# Patient Record
Sex: Female | Born: 1935 | Race: White | Hispanic: No | Smoking: Former smoker
Health system: Southern US, Community
[De-identification: ages and names within clinical notes are randomized; demographics above are authoritative.]

## PROBLEM LIST (undated history)

## (undated) DIAGNOSIS — I10 Essential (primary) hypertension: Secondary | ICD-10-CM

## (undated) DIAGNOSIS — Z923 Personal history of irradiation: Secondary | ICD-10-CM

## (undated) DIAGNOSIS — J449 Chronic obstructive pulmonary disease, unspecified: Secondary | ICD-10-CM

## (undated) DIAGNOSIS — K219 Gastro-esophageal reflux disease without esophagitis: Secondary | ICD-10-CM

## (undated) DIAGNOSIS — Z9221 Personal history of antineoplastic chemotherapy: Secondary | ICD-10-CM

## (undated) DIAGNOSIS — D751 Secondary polycythemia: Secondary | ICD-10-CM

## (undated) DIAGNOSIS — R42 Dizziness and giddiness: Secondary | ICD-10-CM

## (undated) DIAGNOSIS — L409 Psoriasis, unspecified: Secondary | ICD-10-CM

## (undated) DIAGNOSIS — E785 Hyperlipidemia, unspecified: Secondary | ICD-10-CM

## (undated) DIAGNOSIS — N19 Unspecified kidney failure: Secondary | ICD-10-CM

## (undated) DIAGNOSIS — C3412 Malignant neoplasm of upper lobe, left bronchus or lung: Secondary | ICD-10-CM

## (undated) DIAGNOSIS — C3490 Malignant neoplasm of unspecified part of unspecified bronchus or lung: Secondary | ICD-10-CM

## (undated) HISTORY — DX: Unspecified kidney failure: N19

## (undated) HISTORY — DX: Hyperlipidemia, unspecified: E78.5

## (undated) HISTORY — PX: COLON RESECTION: SHX5231

## (undated) HISTORY — DX: Chronic obstructive pulmonary disease, unspecified: J44.9

## (undated) HISTORY — DX: Secondary polycythemia: D75.1

## (undated) HISTORY — PX: HERNIA REPAIR: SHX51

## (undated) HISTORY — DX: Personal history of antineoplastic chemotherapy: Z92.21

## (undated) HISTORY — PX: TOTAL ABDOMINAL HYSTERECTOMY: SHX209

## (undated) HISTORY — DX: Gastro-esophageal reflux disease without esophagitis: K21.9

## (undated) HISTORY — DX: Dizziness and giddiness: R42

## (undated) HISTORY — DX: Malignant neoplasm of unspecified part of unspecified bronchus or lung: C34.90

## (undated) HISTORY — PX: CATARACT EXTRACTION: SUR2

## (undated) HISTORY — DX: Personal history of irradiation: Z92.3

## (undated) HISTORY — DX: Essential (primary) hypertension: I10

---

## 2004-08-24 ENCOUNTER — Other Ambulatory Visit: Payer: Self-pay

## 2004-10-27 ENCOUNTER — Ambulatory Visit: Payer: Self-pay | Admitting: Internal Medicine

## 2004-11-04 ENCOUNTER — Ambulatory Visit: Payer: Self-pay | Admitting: Internal Medicine

## 2005-01-06 ENCOUNTER — Ambulatory Visit: Payer: Self-pay | Admitting: Internal Medicine

## 2005-02-07 ENCOUNTER — Ambulatory Visit: Payer: Self-pay | Admitting: Internal Medicine

## 2005-09-15 ENCOUNTER — Other Ambulatory Visit: Payer: Self-pay

## 2005-09-15 ENCOUNTER — Ambulatory Visit: Payer: Self-pay | Admitting: Orthopaedic Surgery

## 2006-01-27 ENCOUNTER — Ambulatory Visit: Payer: Self-pay | Admitting: Internal Medicine

## 2006-02-02 ENCOUNTER — Ambulatory Visit: Payer: Self-pay | Admitting: Internal Medicine

## 2006-02-21 ENCOUNTER — Ambulatory Visit: Payer: Self-pay | Admitting: Internal Medicine

## 2006-03-05 ENCOUNTER — Ambulatory Visit: Payer: Self-pay | Admitting: Internal Medicine

## 2006-04-04 ENCOUNTER — Ambulatory Visit: Payer: Self-pay | Admitting: Internal Medicine

## 2006-05-05 ENCOUNTER — Ambulatory Visit: Payer: Self-pay | Admitting: Internal Medicine

## 2006-06-04 ENCOUNTER — Ambulatory Visit: Payer: Self-pay | Admitting: Internal Medicine

## 2006-07-05 ENCOUNTER — Ambulatory Visit: Payer: Self-pay | Admitting: Internal Medicine

## 2006-08-05 ENCOUNTER — Ambulatory Visit: Payer: Self-pay | Admitting: Internal Medicine

## 2006-09-04 ENCOUNTER — Ambulatory Visit: Payer: Self-pay | Admitting: Internal Medicine

## 2006-10-19 ENCOUNTER — Ambulatory Visit: Payer: Self-pay | Admitting: Internal Medicine

## 2006-11-04 ENCOUNTER — Ambulatory Visit: Payer: Self-pay | Admitting: Internal Medicine

## 2006-12-05 ENCOUNTER — Ambulatory Visit: Payer: Self-pay | Admitting: Internal Medicine

## 2007-02-14 ENCOUNTER — Ambulatory Visit: Payer: Self-pay | Admitting: Internal Medicine

## 2007-02-26 ENCOUNTER — Ambulatory Visit: Payer: Self-pay | Admitting: Internal Medicine

## 2007-03-06 ENCOUNTER — Ambulatory Visit: Payer: Self-pay | Admitting: Internal Medicine

## 2007-03-28 ENCOUNTER — Ambulatory Visit: Payer: Self-pay | Admitting: Internal Medicine

## 2007-04-05 ENCOUNTER — Ambulatory Visit: Payer: Self-pay | Admitting: Internal Medicine

## 2007-07-06 ENCOUNTER — Ambulatory Visit: Payer: Self-pay | Admitting: Internal Medicine

## 2007-07-25 ENCOUNTER — Ambulatory Visit: Payer: Self-pay | Admitting: Internal Medicine

## 2007-08-06 ENCOUNTER — Ambulatory Visit: Payer: Self-pay | Admitting: Internal Medicine

## 2007-09-19 ENCOUNTER — Inpatient Hospital Stay: Payer: Self-pay | Admitting: Specialist

## 2007-09-19 ENCOUNTER — Other Ambulatory Visit: Payer: Self-pay

## 2007-11-21 ENCOUNTER — Ambulatory Visit: Payer: Self-pay | Admitting: Internal Medicine

## 2007-12-06 ENCOUNTER — Ambulatory Visit: Payer: Self-pay | Admitting: Internal Medicine

## 2008-03-05 ENCOUNTER — Ambulatory Visit: Payer: Self-pay | Admitting: Internal Medicine

## 2008-03-25 ENCOUNTER — Ambulatory Visit: Payer: Self-pay | Admitting: Internal Medicine

## 2008-04-03 ENCOUNTER — Ambulatory Visit: Payer: Self-pay | Admitting: Family Medicine

## 2008-04-04 ENCOUNTER — Ambulatory Visit: Payer: Self-pay | Admitting: Internal Medicine

## 2009-04-15 ENCOUNTER — Ambulatory Visit: Payer: Self-pay | Admitting: Family Medicine

## 2009-11-01 ENCOUNTER — Emergency Department: Payer: Self-pay | Admitting: Internal Medicine

## 2010-01-15 ENCOUNTER — Ambulatory Visit: Payer: Self-pay | Admitting: Specialist

## 2011-04-06 ENCOUNTER — Ambulatory Visit: Payer: Self-pay | Admitting: Ophthalmology

## 2011-05-31 ENCOUNTER — Ambulatory Visit: Payer: Self-pay | Admitting: Gastroenterology

## 2011-06-16 ENCOUNTER — Inpatient Hospital Stay: Payer: Self-pay | Admitting: Internal Medicine

## 2011-09-12 ENCOUNTER — Ambulatory Visit: Payer: Self-pay | Admitting: Gastroenterology

## 2011-09-13 ENCOUNTER — Ambulatory Visit: Payer: Self-pay | Admitting: Gastroenterology

## 2011-09-19 ENCOUNTER — Ambulatory Visit: Payer: Self-pay | Admitting: Gastroenterology

## 2012-04-16 ENCOUNTER — Emergency Department: Payer: Self-pay | Admitting: Emergency Medicine

## 2012-04-17 ENCOUNTER — Emergency Department: Payer: Self-pay | Admitting: Emergency Medicine

## 2012-11-04 ENCOUNTER — Ambulatory Visit: Payer: Self-pay | Admitting: Radiation Oncology

## 2012-11-07 ENCOUNTER — Inpatient Hospital Stay: Payer: Self-pay | Admitting: Family Medicine

## 2012-11-07 LAB — URINALYSIS, COMPLETE
Bacteria: NONE SEEN
Glucose,UR: NEGATIVE mg/dL (ref 0–75)
Nitrite: NEGATIVE
Protein: NEGATIVE
WBC UR: 5 /HPF (ref 0–5)

## 2012-11-07 LAB — COMPREHENSIVE METABOLIC PANEL
Calcium, Total: 9.5 mg/dL (ref 8.5–10.1)
Co2: 29 mmol/L (ref 21–32)
EGFR (African American): >60
EGFR (Non-African Amer.): >60
Potassium: 3.5 mmol/L (ref 3.5–5.1)
SGOT(AST): 20 U/L (ref 15–37)
Sodium: 136 mmol/L (ref 136–145)

## 2012-11-07 LAB — CBC
HCT: 35.2 % (ref 35.0–47.0)
HGB: 11.9 g/dL — ABNORMAL LOW (ref 12.0–16.0)
MCH: 32.1 pg (ref 26.0–34.0)
MCV: 95 fL (ref 80–100)
RBC: 3.72 10*6/uL — ABNORMAL LOW (ref 3.80–5.20)

## 2012-11-07 LAB — TROPONIN I: Troponin-I: <0.02 ng/mL

## 2012-11-07 LAB — MAGNESIUM
Magnesium: 1.3 mg/dL — ABNORMAL LOW
Magnesium: 1.4 mg/dL — ABNORMAL LOW

## 2012-11-07 LAB — PRO B NATRIURETIC PEPTIDE: B-Type Natriuretic Peptide: 171 pg/mL (ref 0–450)

## 2012-11-07 LAB — CK TOTAL AND CKMB (NOT AT ARMC): CK-MB: 1.7 ng/mL (ref 0.5–3.6)

## 2012-11-08 LAB — CBC WITH DIFFERENTIAL/PLATELET
Basophil #: 0 10*3/uL (ref 0.0–0.1)
Eosinophil #: 0 10*3/uL (ref 0.0–0.7)
Lymphocyte %: 4.1 %
MCHC: 35.9 g/dL (ref 32.0–36.0)
MCV: 94 fL (ref 80–100)
Monocyte %: 1.2 %
Neutrophil %: 94.6 %
Platelet: 188 10*3/uL (ref 150–440)
RBC: 3.11 10*6/uL — ABNORMAL LOW (ref 3.80–5.20)
RDW: 13.6 % (ref 11.5–14.5)
WBC: 16.8 10*3/uL — ABNORMAL HIGH (ref 3.6–11.0)

## 2012-11-08 LAB — MAGNESIUM: Magnesium: 2.4 mg/dL

## 2012-11-08 LAB — BASIC METABOLIC PANEL
Anion Gap: 8 (ref 7–16)
Calcium, Total: 8.9 mg/dL (ref 8.5–10.1)
Glucose: 191 mg/dL — ABNORMAL HIGH (ref 65–99)
Osmolality: 282 (ref 275–301)

## 2012-11-10 LAB — CBC WITH DIFFERENTIAL/PLATELET
Basophil #: 0 x10 3/mm 3
Basophil %: 0.1 %
Eosinophil #: 0 x10 3/mm 3
Eosinophil %: 0.1 %
HCT: 28.9 % — ABNORMAL LOW
HGB: 10 g/dL — ABNORMAL LOW
Lymphocyte %: 7.2 %
Lymphs Abs: 0.6 x10 3/mm 3 — ABNORMAL LOW
MCH: 32.3 pg
MCHC: 34.5 g/dL
MCV: 94 fL
Monocyte #: 0.2 "x10 3/mm "
Monocyte %: 2 %
Neutrophil #: 7 x10 3/mm 3 — ABNORMAL HIGH
Neutrophil %: 90.6 %
Platelet: 190 x10 3/mm 3
RBC: 3.09 X10 6/mm 3 — ABNORMAL LOW
RDW: 13.7 %
WBC: 7.8 x10 3/mm 3

## 2012-11-11 LAB — CBC WITH DIFFERENTIAL/PLATELET
Basophil %: 0.2 %
Eosinophil %: 0 %
HGB: 9.8 g/dL — ABNORMAL LOW (ref 12.0–16.0)
Lymphocyte %: 8.5 %
MCH: 31.7 pg (ref 26.0–34.0)
MCHC: 33.9 g/dL (ref 32.0–36.0)
Monocyte #: 0.4 x10 3/mm (ref 0.2–0.9)
Neutrophil %: 86.5 %
Platelet: 231 10*3/uL (ref 150–440)
RBC: 3.11 10*6/uL — ABNORMAL LOW (ref 3.80–5.20)
WBC: 8 10*3/uL (ref 3.6–11.0)

## 2012-11-11 LAB — BASIC METABOLIC PANEL
Anion Gap: 7 (ref 7–16)
Calcium, Total: 8.9 mg/dL (ref 8.5–10.1)
Chloride: 105 mmol/L (ref 98–107)
Co2: 28 mmol/L (ref 21–32)
EGFR (African American): >60
EGFR (Non-African Amer.): >60
Glucose: 135 mg/dL — ABNORMAL HIGH (ref 65–99)
Osmolality: 283 (ref 275–301)
Potassium: 3.6 mmol/L (ref 3.5–5.1)

## 2012-11-12 LAB — BASIC METABOLIC PANEL
Anion Gap: 7 (ref 7–16)
BUN: 19 mg/dL — ABNORMAL HIGH (ref 7–18)
Creatinine: 0.91 mg/dL (ref 0.60–1.30)
EGFR (African American): >60
Sodium: 141 mmol/L (ref 136–145)

## 2012-11-12 LAB — CBC WITH DIFFERENTIAL/PLATELET
Bands: 1 %
Comment - H1-Com3: NORMAL
HCT: 33.3 % — ABNORMAL LOW (ref 35.0–47.0)
HGB: 11.1 g/dL — ABNORMAL LOW (ref 12.0–16.0)
Lymphocytes: 18 %
MCHC: 33.3 g/dL (ref 32.0–36.0)
MCV: 94 fL (ref 80–100)
Metamyelocyte: 1 %
Myelocyte: 1 %
RBC: 3.53 10*6/uL — ABNORMAL LOW (ref 3.80–5.20)
Variant Lymphocyte - H1-Rlymph: 2 %
WBC: 9.5 10*3/uL (ref 3.6–11.0)

## 2012-11-12 LAB — CULTURE, BLOOD (SINGLE)

## 2012-11-14 LAB — CBC WITH DIFFERENTIAL/PLATELET
Basophil %: 0.2 %
Eosinophil #: 0 10*3/uL (ref 0.0–0.7)
Eosinophil %: 0.1 %
HCT: 34 % — ABNORMAL LOW (ref 35.0–47.0)
HGB: 11.5 g/dL — ABNORMAL LOW (ref 12.0–16.0)
Lymphocyte %: 11.6 %
MCHC: 33.9 g/dL (ref 32.0–36.0)
MCV: 94 fL (ref 80–100)
Monocyte #: 0.4 x10 3/mm (ref 0.2–0.9)
Monocyte %: 4.3 %
Neutrophil #: 8 10*3/uL — ABNORMAL HIGH (ref 1.4–6.5)
Neutrophil %: 83.8 %
RBC: 3.63 10*6/uL — ABNORMAL LOW (ref 3.80–5.20)
RDW: 13.8 % (ref 11.5–14.5)
WBC: 9.5 10*3/uL (ref 3.6–11.0)

## 2012-11-14 LAB — BASIC METABOLIC PANEL
Anion Gap: 8 (ref 7–16)
BUN: 21 mg/dL — ABNORMAL HIGH (ref 7–18)
Calcium, Total: 8.7 mg/dL (ref 8.5–10.1)
Chloride: 103 mmol/L (ref 98–107)
Co2: 29 mmol/L (ref 21–32)
EGFR (Non-African Amer.): >60
Glucose: 110 mg/dL — ABNORMAL HIGH (ref 65–99)
Osmolality: 283 (ref 275–301)
Potassium: 3.9 mmol/L (ref 3.5–5.1)

## 2012-11-14 LAB — PROTIME-INR
INR: 0.9
Prothrombin Time: 13 secs (ref 11.5–14.7)

## 2012-11-15 LAB — CBC WITH DIFFERENTIAL/PLATELET
Basophil #: 0 10*3/uL (ref 0.0–0.1)
Eosinophil %: 0.1 %
Lymphocyte %: 14 %
MCHC: 35.6 g/dL (ref 32.0–36.0)
Monocyte %: 6.5 %
Neutrophil %: 79.1 %
Platelet: 239 10*3/uL (ref 150–440)
RBC: 3.45 10*6/uL — ABNORMAL LOW (ref 3.80–5.20)
RDW: 13.6 % (ref 11.5–14.5)
WBC: 10.2 10*3/uL (ref 3.6–11.0)

## 2012-11-15 LAB — BASIC METABOLIC PANEL
BUN: 22 mg/dL — ABNORMAL HIGH (ref 7–18)
Calcium, Total: 8.5 mg/dL (ref 8.5–10.1)
Chloride: 104 mmol/L (ref 98–107)
Co2: 30 mmol/L (ref 21–32)
EGFR (African American): >60
Glucose: 97 mg/dL (ref 65–99)
Osmolality: 281 (ref 275–301)
Potassium: 3.9 mmol/L (ref 3.5–5.1)

## 2012-11-19 ENCOUNTER — Inpatient Hospital Stay: Payer: Self-pay | Admitting: Cardiothoracic Surgery

## 2012-11-19 HISTORY — PX: CT GUIDED BIOPSY  (ARMC HX): HXRAD1397

## 2012-11-19 LAB — COMPREHENSIVE METABOLIC PANEL
Alkaline Phosphatase: 67 U/L (ref 50–136)
Anion Gap: 5 — ABNORMAL LOW (ref 7–16)
BUN: 11 mg/dL (ref 7–18)
Calcium, Total: 8.3 mg/dL — ABNORMAL LOW (ref 8.5–10.1)
Chloride: 102 mmol/L (ref 98–107)
Co2: 32 mmol/L (ref 21–32)
EGFR (African American): >60
Potassium: 3.5 mmol/L (ref 3.5–5.1)
SGOT(AST): 20 U/L (ref 15–37)
SGPT (ALT): 30 U/L (ref 12–78)
Total Protein: 5.6 g/dL — ABNORMAL LOW (ref 6.4–8.2)

## 2012-11-19 LAB — CBC WITH DIFFERENTIAL/PLATELET
Basophil #: 0 10*3/uL (ref 0.0–0.1)
Basophil %: 0.1 %
Eosinophil %: 0.3 %
HCT: 32.5 % — ABNORMAL LOW (ref 35.0–47.0)
Lymphocyte #: 1.2 10*3/uL (ref 1.0–3.6)
Lymphocyte %: 10.4 %
MCHC: 33.4 g/dL (ref 32.0–36.0)
MCV: 94 fL (ref 80–100)
Monocyte #: 0.8 x10 3/mm (ref 0.2–0.9)
Neutrophil #: 9.4 10*3/uL — ABNORMAL HIGH (ref 1.4–6.5)
Platelet: 181 10*3/uL (ref 150–440)
RDW: 14.4 % (ref 11.5–14.5)

## 2012-11-20 LAB — CBC WITH DIFFERENTIAL/PLATELET
Basophil #: 0 10*3/uL (ref 0.0–0.1)
Basophil %: 0.1 %
Eosinophil #: 0 10*3/uL (ref 0.0–0.7)
HGB: 10.4 g/dL — ABNORMAL LOW (ref 12.0–16.0)
Lymphocyte %: 12.6 %
MCH: 32.7 pg (ref 26.0–34.0)
MCHC: 34.5 g/dL (ref 32.0–36.0)
Neutrophil #: 8.6 10*3/uL — ABNORMAL HIGH (ref 1.4–6.5)
Neutrophil %: 80.8 %
Platelet: 179 10*3/uL (ref 150–440)

## 2012-11-20 LAB — PATHOLOGY REPORT

## 2012-11-21 LAB — BASIC METABOLIC PANEL
Anion Gap: 4 — ABNORMAL LOW (ref 7–16)
BUN: 16 mg/dL (ref 7–18)
Co2: 34 mmol/L — ABNORMAL HIGH (ref 21–32)
Creatinine: 0.74 mg/dL (ref 0.60–1.30)
EGFR (African American): >60

## 2012-11-21 LAB — CBC WITH DIFFERENTIAL/PLATELET
Basophil #: 0 10*3/uL (ref 0.0–0.1)
HCT: 31 % — ABNORMAL LOW (ref 35.0–47.0)
Lymphocyte #: 2.1 10*3/uL (ref 1.0–3.6)
MCH: 32.7 pg (ref 26.0–34.0)
MCV: 94 fL (ref 80–100)
Monocyte %: 9 %
Platelet: 186 10*3/uL (ref 150–440)
RBC: 3.28 10*6/uL — ABNORMAL LOW (ref 3.80–5.20)
RDW: 14.2 % (ref 11.5–14.5)
WBC: 10.8 10*3/uL (ref 3.6–11.0)

## 2012-12-05 ENCOUNTER — Ambulatory Visit: Payer: Self-pay | Admitting: Radiation Oncology

## 2012-12-06 ENCOUNTER — Ambulatory Visit: Payer: Self-pay | Admitting: Radiation Oncology

## 2012-12-19 LAB — CBC CANCER CENTER
Basophil #: 0 x10 3/mm (ref 0.0–0.1)
Eosinophil #: 0 x10 3/mm (ref 0.0–0.7)
Lymphocyte #: 0.4 x10 3/mm — ABNORMAL LOW (ref 1.0–3.6)
MCH: 31.5 pg (ref 26.0–34.0)
MCV: 92 fL (ref 80–100)
Neutrophil #: 3.1 x10 3/mm (ref 1.4–6.5)
Neutrophil %: 88.7 %
Platelet: 179 x10 3/mm (ref 150–440)
RBC: 3.34 10*6/uL — ABNORMAL LOW (ref 3.80–5.20)
WBC: 3.5 x10 3/mm — ABNORMAL LOW (ref 3.6–11.0)

## 2012-12-19 LAB — BASIC METABOLIC PANEL
Anion Gap: 11 (ref 7–16)
Creatinine: 1.22 mg/dL (ref 0.60–1.30)
EGFR (African American): 50 — ABNORMAL LOW
EGFR (Non-African Amer.): 43 — ABNORMAL LOW
Potassium: 3.9 mmol/L (ref 3.5–5.1)
Sodium: 136 mmol/L (ref 136–145)

## 2012-12-19 LAB — HEPATIC FUNCTION PANEL A (ARMC)
Alkaline Phosphatase: 71 U/L (ref 50–136)
Bilirubin, Direct: <0.05 mg/dL (ref 0.00–0.20)
SGOT(AST): 15 U/L (ref 15–37)
Total Protein: 6.9 g/dL (ref 6.4–8.2)

## 2012-12-26 LAB — CBC CANCER CENTER
Basophil #: 0 x10 3/mm (ref 0.0–0.1)
Basophil %: 0.2 %
Eosinophil #: 0 x10 3/mm (ref 0.0–0.7)
Eosinophil %: 0 %
HGB: 11 g/dL — ABNORMAL LOW (ref 12.0–16.0)
Lymphocyte %: 9.3 %
MCH: 31.3 pg (ref 26.0–34.0)
MCHC: 34.4 g/dL (ref 32.0–36.0)
MCV: 91 fL (ref 80–100)
Monocyte %: 0.6 %
Neutrophil %: 89.9 %
Platelet: 314 x10 3/mm (ref 150–440)
RDW: 13.9 % (ref 11.5–14.5)
WBC: 3.8 x10 3/mm (ref 3.6–11.0)

## 2012-12-26 LAB — BASIC METABOLIC PANEL
Anion Gap: 14 (ref 7–16)
BUN: 15 mg/dL (ref 7–18)
Chloride: 97 mmol/L — ABNORMAL LOW (ref 98–107)
Co2: 24 mmol/L (ref 21–32)
Creatinine: 1.26 mg/dL (ref 0.60–1.30)
EGFR (Non-African Amer.): 41 — ABNORMAL LOW
Potassium: 3.9 mmol/L (ref 3.5–5.1)
Sodium: 135 mmol/L — ABNORMAL LOW (ref 136–145)

## 2013-01-02 LAB — BASIC METABOLIC PANEL
BUN: 11 mg/dL (ref 7–18)
Calcium, Total: 8.2 mg/dL — ABNORMAL LOW (ref 8.5–10.1)
Chloride: 100 mmol/L (ref 98–107)
Co2: 31 mmol/L (ref 21–32)
Creatinine: 1 mg/dL (ref 0.60–1.30)
EGFR (African American): >60
Osmolality: 278 (ref 275–301)
Potassium: 2.9 mmol/L — ABNORMAL LOW (ref 3.5–5.1)
Sodium: 139 mmol/L (ref 136–145)

## 2013-01-02 LAB — CBC CANCER CENTER
Eosinophil #: 0 x10 3/mm (ref 0.0–0.7)
HCT: 27.5 % — ABNORMAL LOW (ref 35.0–47.0)
HGB: 9.5 g/dL — ABNORMAL LOW (ref 12.0–16.0)
MCH: 31.2 pg (ref 26.0–34.0)
MCHC: 34.6 g/dL (ref 32.0–36.0)
Monocyte #: 0.2 x10 3/mm (ref 0.2–0.9)
Monocyte %: 9.9 %
Neutrophil #: 1.3 x10 3/mm — ABNORMAL LOW (ref 1.4–6.5)
Neutrophil %: 56.7 %
Platelet: 147 x10 3/mm — ABNORMAL LOW (ref 150–440)
RDW: 14.1 % (ref 11.5–14.5)

## 2013-01-05 ENCOUNTER — Ambulatory Visit: Payer: Self-pay | Admitting: Radiation Oncology

## 2013-01-09 LAB — CBC CANCER CENTER
Basophil #: 0 x10 3/mm (ref 0.0–0.1)
Eosinophil #: 0 x10 3/mm (ref 0.0–0.7)
Eosinophil %: 1.1 %
HCT: 29.7 % — ABNORMAL LOW (ref 35.0–47.0)
HGB: 10.4 g/dL — ABNORMAL LOW (ref 12.0–16.0)
Lymphocyte %: 29.8 %
MCH: 31.6 pg (ref 26.0–34.0)
MCHC: 35.1 g/dL (ref 32.0–36.0)
MCV: 90 fL (ref 80–100)
Monocyte #: 0.4 x10 3/mm (ref 0.2–0.9)
Monocyte %: 10.4 %
Neutrophil #: 2 x10 3/mm (ref 1.4–6.5)
Neutrophil %: 57.4 %
Platelet: 138 x10 3/mm — ABNORMAL LOW (ref 150–440)
RBC: 3.29 10*6/uL — ABNORMAL LOW (ref 3.80–5.20)
RDW: 14.5 % (ref 11.5–14.5)

## 2013-01-16 LAB — CBC CANCER CENTER
Basophil #: 0 x10 3/mm (ref 0.0–0.1)
Eosinophil #: 0 x10 3/mm (ref 0.0–0.7)
Eosinophil %: 1.4 %
HCT: 24.8 % — ABNORMAL LOW (ref 35.0–47.0)
HGB: 8.7 g/dL — ABNORMAL LOW (ref 12.0–16.0)
Lymphocyte %: 23.6 %
MCH: 32 pg (ref 26.0–34.0)
MCHC: 35.2 g/dL (ref 32.0–36.0)
MCV: 91 fL (ref 80–100)
Monocyte %: 15 %
Neutrophil %: 58.9 %
Platelet: 110 x10 3/mm — ABNORMAL LOW (ref 150–440)
RBC: 2.73 10*6/uL — ABNORMAL LOW (ref 3.80–5.20)
RDW: 15.9 % — ABNORMAL HIGH (ref 11.5–14.5)
WBC: 3.2 x10 3/mm — ABNORMAL LOW (ref 3.6–11.0)

## 2013-01-16 LAB — BASIC METABOLIC PANEL
Calcium, Total: 7.8 mg/dL — ABNORMAL LOW (ref 8.5–10.1)
Chloride: 101 mmol/L (ref 98–107)
Co2: 29 mmol/L (ref 21–32)
EGFR (African American): >60
EGFR (Non-African Amer.): 56 — ABNORMAL LOW
Glucose: 147 mg/dL — ABNORMAL HIGH (ref 65–99)
Sodium: 139 mmol/L (ref 136–145)

## 2013-01-21 LAB — CBC CANCER CENTER
HGB: 9.9 g/dL — ABNORMAL LOW (ref 12.0–16.0)
Lymphocyte %: 20.1 %
MCH: 31.7 pg (ref 26.0–34.0)
MCV: 90 fL (ref 80–100)
Monocyte #: 0.3 x10 3/mm (ref 0.2–0.9)
Monocyte %: 6.6 %
Neutrophil %: 71.4 %
RBC: 3.12 10*6/uL — ABNORMAL LOW (ref 3.80–5.20)
WBC: 5.2 x10 3/mm (ref 3.6–11.0)

## 2013-01-22 LAB — BASIC METABOLIC PANEL
Calcium, Total: 9.2 mg/dL (ref 8.5–10.1)
Creatinine: 0.99 mg/dL (ref 0.60–1.30)
EGFR (African American): >60
EGFR (Non-African Amer.): 55 — ABNORMAL LOW
Osmolality: 272 (ref 275–301)
Potassium: 4.3 mmol/L (ref 3.5–5.1)

## 2013-01-22 LAB — CBC CANCER CENTER
Eosinophil #: 0 x10 3/mm (ref 0.0–0.7)
Eosinophil %: 0.6 %
HCT: 28.8 % — ABNORMAL LOW (ref 35.0–47.0)
HGB: 9.9 g/dL — ABNORMAL LOW (ref 12.0–16.0)
Lymphocyte #: 0.7 x10 3/mm — ABNORMAL LOW (ref 1.0–3.6)
Lymphocyte %: 12.2 %
Monocyte %: 6 %
Neutrophil #: 4.6 x10 3/mm (ref 1.4–6.5)
Neutrophil %: 80.8 %
Platelet: 225 x10 3/mm (ref 150–440)
RDW: 17.1 % — ABNORMAL HIGH (ref 11.5–14.5)

## 2013-01-23 ENCOUNTER — Encounter: Payer: Self-pay | Admitting: *Deleted

## 2013-01-29 ENCOUNTER — Encounter: Payer: Self-pay | Admitting: *Deleted

## 2013-01-30 LAB — CBC CANCER CENTER
Basophil #: 0 x10 3/mm (ref 0.0–0.1)
Basophil %: 0.9 %
Eosinophil %: 0.8 %
HGB: 8.9 g/dL — ABNORMAL LOW (ref 12.0–16.0)
Lymphocyte #: 0.6 x10 3/mm — ABNORMAL LOW (ref 1.0–3.6)
Lymphocyte %: 22.8 %
MCH: 32.1 pg (ref 26.0–34.0)
MCV: 93 fL (ref 80–100)
Monocyte %: 10 %
RBC: 2.76 10*6/uL — ABNORMAL LOW (ref 3.80–5.20)
RDW: 18.3 % — ABNORMAL HIGH (ref 11.5–14.5)

## 2013-01-30 LAB — BASIC METABOLIC PANEL
Calcium, Total: 8.3 mg/dL — ABNORMAL LOW (ref 8.5–10.1)
Chloride: 101 mmol/L (ref 98–107)
Creatinine: 1.12 mg/dL (ref 0.60–1.30)
Glucose: 132 mg/dL — ABNORMAL HIGH (ref 65–99)
Osmolality: 269 (ref 275–301)
Potassium: 3.9 mmol/L (ref 3.5–5.1)
Sodium: 133 mmol/L — ABNORMAL LOW (ref 136–145)

## 2013-01-31 ENCOUNTER — Encounter: Payer: Self-pay | Admitting: Cardiovascular Disease

## 2013-01-31 ENCOUNTER — Ambulatory Visit (INDEPENDENT_AMBULATORY_CARE_PROVIDER_SITE_OTHER): Payer: Medicare Other | Admitting: Cardiovascular Disease

## 2013-01-31 VITALS — BP 172/70 | HR 94 | Ht <= 58 in | Wt 130.2 lb

## 2013-01-31 DIAGNOSIS — R42 Dizziness and giddiness: Secondary | ICD-10-CM

## 2013-01-31 DIAGNOSIS — R0600 Dyspnea, unspecified: Secondary | ICD-10-CM

## 2013-01-31 DIAGNOSIS — R000 Tachycardia, unspecified: Secondary | ICD-10-CM

## 2013-01-31 MED ORDER — METOPROLOL TARTRATE 50 MG PO TABS: 50.0000 mg | ORAL_TABLET | Freq: Two times a day (BID) | ORAL | Status: DC

## 2013-01-31 NOTE — Patient Instructions (Addendum)
Stop Lisinopril.  Change Metoprolol to 50 mg twice daily.   Labs today.  Your physician has requested that you have an echocardiogram. Echocardiography is a painless test that uses sound waves to create images of your heart. It provides your doctor with information about the size and shape of your heart and how well your heart's chambers and valves are working. This procedure takes approximately one hour. There are no restrictions for this procedure.  Follow up after echo.

## 2013-02-02 ENCOUNTER — Ambulatory Visit: Payer: Self-pay | Admitting: Radiation Oncology

## 2013-02-03 ENCOUNTER — Encounter: Payer: Self-pay | Admitting: Cardiovascular Disease

## 2013-02-03 ENCOUNTER — Inpatient Hospital Stay: Payer: Self-pay

## 2013-02-03 DIAGNOSIS — R000 Tachycardia, unspecified: Secondary | ICD-10-CM | POA: Insufficient documentation

## 2013-02-03 LAB — COMPREHENSIVE METABOLIC PANEL
Albumin: 3.3 g/dL — ABNORMAL LOW (ref 3.4–5.0)
Anion Gap: 9 (ref 7–16)
Chloride: 102 mmol/L (ref 98–107)
Co2: 24 mmol/L (ref 21–32)
Creatinine: 0.76 mg/dL (ref 0.60–1.30)
EGFR (African American): >60
EGFR (Non-African Amer.): >60
Glucose: 100 mg/dL — ABNORMAL HIGH (ref 65–99)
Osmolality: 271 (ref 275–301)
Potassium: 4.4 mmol/L (ref 3.5–5.1)
SGPT (ALT): 29 U/L (ref 12–78)
Total Protein: 6.3 g/dL — ABNORMAL LOW (ref 6.4–8.2)

## 2013-02-03 LAB — URINALYSIS, COMPLETE
Bacteria: NONE SEEN
Blood: NEGATIVE
Nitrite: NEGATIVE
Protein: NEGATIVE
RBC,UR: 1 /HPF (ref 0–5)
Squamous Epithelial: 1
WBC UR: 3 /HPF (ref 0–5)

## 2013-02-03 LAB — CBC
HGB: 8 g/dL — ABNORMAL LOW (ref 12.0–16.0)
MCH: 32.3 pg (ref 26.0–34.0)
MCV: 94 fL (ref 80–100)
Platelet: 148 10*3/uL — ABNORMAL LOW (ref 150–440)
RBC: 2.49 10*6/uL — ABNORMAL LOW (ref 3.80–5.20)
RDW: 19.6 % — ABNORMAL HIGH (ref 11.5–14.5)

## 2013-02-03 LAB — TSH: Thyroid Stimulating Horm: 1.1 u[IU]/mL

## 2013-02-03 NOTE — Assessment & Plan Note (Addendum)
The patient has intermittent sinus tachycardia which is likely due to hyperadrenergic state related to COPD and lung disease. Pulmonary embolism was ruled out by CT a recently. We also have to make sure there is no cardiac involvement. Thus, I will obtain an echocardiogram for evaluation. I will also request TSH to evaluate her thyroid function.  Her labs showed only slight volume depletion which doesn't explain the degree of tachycardia. There might be a component of autonomic dysfunction. She has been having fluctuations in her blood pressure as well. I will change metoprolol to twice daily and continue diltiazem. Given that she had low blood pressure yesterday, I will hold lisinopril for now.

## 2013-02-03 NOTE — Assessment & Plan Note (Signed)
I will obtain an echocardiogram as outlined above to evaluate LV systolic function, valvular structure and make sure she does not have pericardial effusion given her left-sided lung cancer

## 2013-02-03 NOTE — Progress Notes (Signed)
HPI  This is a pleasant 79 year old Caucasian female who was referred by Dr. Sherrlyn Hock for evaluation and management of dizziness and tachycardia. The patient has known history of COPD. 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 She was started on radiation and chemotherapy with Taxol and carboplatin on January 15 of 2014. She has no previous cardiac history. She was seen by Dr. Sherrlyn Hock last week for exertional dyspnea, dizziness, generalized weakness and tachycardia. There was no chest pain or syncope. She was noted to be tachycardic with a heart rate of 134 beats per minute (sinus tachycardia). She underwent labs showed a creatinine of 0.9 with a slightly elevated BUN 20. D-dimer was elevated at 0.82. Hemoglobin was 9.9. due to concerns about embolism she was sent for CTA of the chest which showed no evidence of pulmonary embolus. The lung mass was noted to be smaller in size. No pericardial effusion was noted. There was coronary calcifications in the LAD and RCA distribution. Patient has the size of a time. She's also on metoprolol once daily as well as lisinopril for hypertension. Yesterday, her blood pressure dropped to 91/46 with a heart rate of 118. I reviewed ECG they are distant with sinus tachycardia and not other tachyarrhythmia. The patient's other medical problems include GI bleed 2004 previous tobacco use, psoriasis and secondary polycythemia she was a smoker for  Allergies  Allergen Reactions  . Ciprofloxacin   . Norvasc (Amlodipine Besylate)      Current Outpatient Prescriptions on File Prior to Visit  Medication Sig Dispense Refill  . albuterol (PROVENTIL HFA;VENTOLIN HFA) 108 (90 BASE) MCG/ACT inhaler Inhale 2 puffs into the lungs every 6 (six) hours as needed for wheezing.      . budesonide-formoterol (SYMBICORT) 80-4.5 MCG/ACT inhaler Inhale 2 puffs into the lungs every 12 (twelve) hours.      Marland Kitchen diltiazem (DILACOR  XR) 120 MG 24 hr capsule Take 120 mg by mouth daily.      . Multiple Vitamin (MULTIVITAMIN) tablet Take 1 tablet by mouth daily.      Marland Kitchen omeprazole (PRILOSEC) 40 MG capsule Take 40 mg by mouth daily.      . potassium chloride (K-DUR,KLOR-CON) 10 MEQ tablet Take 10 mEq by mouth daily.      Marland Kitchen tiotropium (SPIRIVA) 18 MCG inhalation capsule Place 18 mcg into inhaler and inhale daily.       No current facility-administered medications on file prior to visit.     Past Medical History  Diagnosis Date  . COPD (chronic obstructive pulmonary disease)   . GERD (gastroesophageal reflux disease)   . Hypertension   . Erythrocytosis   . Polycythemia   . Kidney failure   . Hyperlipidemia   . Lung cancer     squamous, stage 3     Past Surgical History  Procedure Laterality Date  . Colon resection    . Cataract extraction    . Hernia repair    . Total abdominal hysterectomy       Family History  Problem Relation Age of Onset  . Heart attack Father      History   Social History  . Marital Status: Widowed    Spouse Name: N/A    Number of Children: N/A  . Years of Education: N/A   Occupational History  . Not on file.   Social History Main Topics  . Smoking status: Former Smoker -- 1.00 packs/day for 40 years  .  Smokeless tobacco: Not on file  . Alcohol Use: No  . Drug Use: No  . Sexually Active: Not on file   Other Topics Concern  . Not on file   Social History Narrative  . No narrative on file     ROS Constitutional: Negative for fever, chills, diaphoresis.  HENT: Negative for hearing loss, nosebleeds, congestion, sore throat, facial swelling, drooling, trouble swallowing, neck pain, voice change, sinus pressure and tinnitus.  Eyes: Negative for photophobia, pain, discharge and visual disturbance.  Respiratory: Negative for apnea,  chest tightness and wheezing.  Cardiovascular: Negative for chest pain and leg swelling.  Gastrointestinal: Negative for vomiting,  abdominal pain, diarrhea, constipation, blood in stool and abdominal distention.  Genitourinary: Negative for dysuria, urgency, frequency, hematuria and decreased urine volume.  Musculoskeletal: Negative for myalgias, back pain, joint swelling, arthralgias and gait problem.  Skin: Negative for color change, pallor, rash and wound.  Neurological: Negative for  tremors, seizures, syncope, speech difficulty, weakness, light-headedness, numbness and headaches.  Psychiatric/Behavioral: Negative for suicidal ideas, hallucinations, behavioral problems and agitation. The patient is not nervous/anxious.     PHYSICAL EXAM   BP 172/70  Pulse 94  Ht 4\' 10"  (1.473 m)  Wt 130 lb 4 oz (59.081 kg)  BMI 27.23 kg/m2 Constitutional: She is oriented to person, place, and time. She appears well-developed and well-nourished. No distress.  HENT: No nasal discharge.  Head: Normocephalic and atraumatic.  Eyes: Pupils are equal and round. Right eye exhibits no discharge. Left eye exhibits no discharge.  Neck: Normal range of motion. Neck supple. No JVD present. No thyromegaly present.  Cardiovascular: Normal rate, regular rhythm, normal heart sounds. Exam reveals no gallop and no friction rub. No murmur heard.  Pulmonary/Chest: Effort normal and breath sounds normal. No stridor. No respiratory distress. She has no wheezes. She has no rales. She exhibits no tenderness.  Abdominal: Soft. Bowel sounds are normal. She exhibits no distension. There is no tenderness. There is no rebound and no guarding.  Musculoskeletal: Normal range of motion. She exhibits no edema and no tenderness.  Neurological: She is alert and oriented to person, place, and time. Coordination normal.  Skin: Skin is warm and dry. No rash noted. She is not diaphoretic. No erythema. No pallor.  Psychiatric: She has a normal mood and affect. Her behavior is normal. Judgment and thought content normal.     ZOX:WRUEA  Rhythm  - -RSR(V1)  -nondiagnostic.   BORDERLINE RHYTHM   ASSESSMENT AND PLAN

## 2013-02-04 LAB — BASIC METABOLIC PANEL WITH GFR
Anion Gap: 13
BUN: 14 mg/dL
Calcium, Total: 7.6 mg/dL — ABNORMAL LOW
Chloride: 98 mmol/L
Co2: 21 mmol/L
Creatinine: 0.92 mg/dL
EGFR (African American): >60
EGFR (Non-African Amer.): >60
Glucose: 211 mg/dL — ABNORMAL HIGH
Osmolality: 271
Potassium: 4.3 mmol/L
Sodium: 132 mmol/L — ABNORMAL LOW

## 2013-02-04 LAB — CBC WITH DIFFERENTIAL/PLATELET
Basophil #: 0 10*3/uL
Basophil %: 0.1 %
Eosinophil #: 0 10*3/uL
Eosinophil %: 0 %
HCT: 21.9 % — ABNORMAL LOW
HGB: 7.4 g/dL — ABNORMAL LOW
Lymphocyte %: 7.6 %
Lymphs Abs: 0.2 10*3/uL — ABNORMAL LOW
MCH: 31.6 pg
MCHC: 34 g/dL
MCV: 93 fL
Monocyte #: 0 10*3/uL — ABNORMAL LOW
Monocyte %: 1.6 %
Neutrophil #: 2.1 10*3/uL
Neutrophil %: 90.7 %
Platelet: 143 10*3/uL — ABNORMAL LOW
RBC: 2.36 X10 6/mm 3 — ABNORMAL LOW
RDW: 19.4 % — ABNORMAL HIGH
WBC: 2.3 10*3/uL — ABNORMAL LOW

## 2013-02-04 LAB — TROPONIN I
Troponin-I: <0.02 ng/mL
Troponin-I: <0.02 ng/mL

## 2013-02-05 LAB — BASIC METABOLIC PANEL
Anion Gap: 9 (ref 7–16)
Anion Gap: 8 (ref 7–16)
BUN: 21 mg/dL — ABNORMAL HIGH (ref 7–18)
Calcium, Total: 7.9 mg/dL — ABNORMAL LOW (ref 8.5–10.1)
Calcium, Total: 8 mg/dL — ABNORMAL LOW (ref 8.5–10.1)
Chloride: 100 mmol/L (ref 98–107)
Chloride: 100 mmol/L (ref 98–107)
Co2: 23 mmol/L (ref 21–32)
Co2: 24 mmol/L (ref 21–32)
Creatinine: 1.03 mg/dL (ref 0.60–1.30)
Creatinine: 1.09 mg/dL (ref 0.60–1.30)
EGFR (Non-African Amer.): 49 — ABNORMAL LOW
Glucose: 139 mg/dL — ABNORMAL HIGH (ref 65–99)
Glucose: 147 mg/dL — ABNORMAL HIGH (ref 65–99)
Osmolality: 270 (ref 275–301)
Potassium: 4.6 mmol/L (ref 3.5–5.1)
Potassium: 4.2 mmol/L (ref 3.5–5.1)

## 2013-02-05 LAB — CBC WITH DIFFERENTIAL/PLATELET
Basophil #: 0 10*3/uL (ref 0.0–0.1)
Basophil %: 0.1 %
Eosinophil #: 0 10*3/uL (ref 0.0–0.7)
HCT: 24.1 % — ABNORMAL LOW (ref 35.0–47.0)
MCHC: 36 g/dL (ref 32.0–36.0)
Neutrophil #: 3.3 10*3/uL (ref 1.4–6.5)
Platelet: 145 10*3/uL — ABNORMAL LOW (ref 150–440)
RBC: 2.61 10*6/uL — ABNORMAL LOW (ref 3.80–5.20)
WBC: 3.5 10*3/uL — ABNORMAL LOW (ref 3.6–11.0)

## 2013-02-05 LAB — MAGNESIUM: Magnesium: 0.6 mg/dL — ABNORMAL LOW

## 2013-02-06 LAB — CBC WITH DIFFERENTIAL/PLATELET
Basophil #: 0 10*3/uL (ref 0.0–0.1)
Basophil %: 0.1 %
Eosinophil %: 0 %
HGB: 8.5 g/dL — ABNORMAL LOW (ref 12.0–16.0)
Lymphocyte #: 0.1 10*3/uL — ABNORMAL LOW (ref 1.0–3.6)
Lymphocyte %: 3.2 %
MCH: 32.6 pg (ref 26.0–34.0)
MCHC: 35.4 g/dL (ref 32.0–36.0)
MCV: 92 fL (ref 80–100)
Monocyte #: 0.2 x10 3/mm (ref 0.2–0.9)
Monocyte %: 4.4 %
Neutrophil #: 3.4 10*3/uL (ref 1.4–6.5)
Neutrophil %: 92.3 %
Platelet: 147 10*3/uL — ABNORMAL LOW (ref 150–440)
RDW: 18.1 % — ABNORMAL HIGH (ref 11.5–14.5)

## 2013-02-06 LAB — BASIC METABOLIC PANEL
Anion Gap: 9 (ref 7–16)
Chloride: 95 mmol/L — ABNORMAL LOW (ref 98–107)
Glucose: 173 mg/dL — ABNORMAL HIGH (ref 65–99)
Osmolality: 267 (ref 275–301)
Sodium: 129 mmol/L — ABNORMAL LOW (ref 136–145)

## 2013-02-07 LAB — CBC WITH DIFFERENTIAL/PLATELET
Basophil #: 0 10*3/uL (ref 0.0–0.1)
Eosinophil %: 0.1 %
HCT: 26.6 % — ABNORMAL LOW (ref 35.0–47.0)
MCH: 33.1 pg (ref 26.0–34.0)
MCHC: 35.8 g/dL (ref 32.0–36.0)
Monocyte #: 0.2 x10 3/mm (ref 0.2–0.9)
Monocyte %: 4.8 %
Neutrophil #: 4.4 10*3/uL (ref 1.4–6.5)
RBC: 2.88 10*6/uL — ABNORMAL LOW (ref 3.80–5.20)
WBC: 4.7 10*3/uL (ref 3.6–11.0)

## 2013-02-07 LAB — BASIC METABOLIC PANEL
Anion Gap: 6 — ABNORMAL LOW (ref 7–16)
Co2: 28 mmol/L (ref 21–32)
Creatinine: 0.84 mg/dL (ref 0.60–1.30)
EGFR (African American): >60
EGFR (Non-African Amer.): >60
Potassium: 3.8 mmol/L (ref 3.5–5.1)
Sodium: 133 mmol/L — ABNORMAL LOW (ref 136–145)

## 2013-02-07 LAB — MAGNESIUM: Magnesium: 1.8 mg/dL

## 2013-02-13 LAB — CBC CANCER CENTER
Basophil #: 0 x10 3/mm (ref 0.0–0.1)
Eosinophil %: 0.1 %
HCT: 30.6 % — ABNORMAL LOW (ref 35.0–47.0)
HGB: 10.7 g/dL — ABNORMAL LOW (ref 12.0–16.0)
Lymphocyte %: 8.9 %
MCH: 33.4 pg (ref 26.0–34.0)
Monocyte #: 0.7 x10 3/mm (ref 0.2–0.9)
Monocyte %: 10.9 %
Neutrophil %: 79.8 %
Platelet: 153 x10 3/mm (ref 150–440)
RBC: 3.21 10*6/uL — ABNORMAL LOW (ref 3.80–5.20)
WBC: 6.8 x10 3/mm (ref 3.6–11.0)

## 2013-02-13 LAB — BASIC METABOLIC PANEL
BUN: 20 mg/dL — ABNORMAL HIGH (ref 7–18)
Calcium, Total: 8.8 mg/dL (ref 8.5–10.1)
Chloride: 98 mmol/L (ref 98–107)
Creatinine: 1.02 mg/dL (ref 0.60–1.30)
Potassium: 3.9 mmol/L (ref 3.5–5.1)

## 2013-02-13 LAB — HEPATIC FUNCTION PANEL A (ARMC)
Albumin: 3.1 g/dL — ABNORMAL LOW (ref 3.4–5.0)
SGOT(AST): 16 U/L (ref 15–37)
SGPT (ALT): 56 U/L (ref 12–78)
Total Protein: 6.1 g/dL — ABNORMAL LOW (ref 6.4–8.2)

## 2013-02-21 ENCOUNTER — Observation Stay: Payer: Self-pay | Admitting: Internal Medicine

## 2013-02-21 LAB — COMPREHENSIVE METABOLIC PANEL
Alkaline Phosphatase: 75 U/L (ref 50–136)
Anion Gap: 11 (ref 7–16)
BUN: 11 mg/dL (ref 7–18)
Calcium, Total: 8.6 mg/dL (ref 8.5–10.1)
Chloride: 93 mmol/L — ABNORMAL LOW (ref 98–107)
Co2: 27 mmol/L (ref 21–32)
Creatinine: 0.86 mg/dL (ref 0.60–1.30)
EGFR (African American): >60
EGFR (Non-African Amer.): >60
Glucose: 126 mg/dL — ABNORMAL HIGH (ref 65–99)
Potassium: 4.1 mmol/L (ref 3.5–5.1)
SGOT(AST): 23 U/L (ref 15–37)

## 2013-02-21 LAB — CBC WITH DIFFERENTIAL/PLATELET
Basophil #: 0 10*3/uL (ref 0.0–0.1)
Basophil %: 0.9 %
HCT: 27.4 % — ABNORMAL LOW (ref 35.0–47.0)
Lymphocyte #: 0.7 10*3/uL — ABNORMAL LOW (ref 1.0–3.6)
Lymphocyte %: 16.4 %
MCH: 33.8 pg (ref 26.0–34.0)
Monocyte #: 0.3 x10 3/mm (ref 0.2–0.9)
Neutrophil #: 3 10*3/uL (ref 1.4–6.5)
Neutrophil %: 74.6 %
Platelet: 97 10*3/uL — ABNORMAL LOW (ref 150–440)
RBC: 2.89 10*6/uL — ABNORMAL LOW (ref 3.80–5.20)
RDW: 20.1 % — ABNORMAL HIGH (ref 11.5–14.5)
WBC: 4 10*3/uL (ref 3.6–11.0)

## 2013-02-22 LAB — CBC WITH DIFFERENTIAL/PLATELET
Basophil #: 0 10*3/uL (ref 0.0–0.1)
Basophil %: 0.1 %
Eosinophil #: 0 10*3/uL (ref 0.0–0.7)
HGB: 9.3 g/dL — ABNORMAL LOW (ref 12.0–16.0)
Lymphocyte #: 0.2 10*3/uL — ABNORMAL LOW (ref 1.0–3.6)
Lymphocyte %: 3.8 %
MCH: 34.5 pg — ABNORMAL HIGH (ref 26.0–34.0)
MCHC: 36.5 g/dL — ABNORMAL HIGH (ref 32.0–36.0)
MCV: 94 fL (ref 80–100)
Monocyte #: 0.2 x10 3/mm (ref 0.2–0.9)
Monocyte %: 3.2 %
Neutrophil %: 92.9 %
RBC: 2.69 10*6/uL — ABNORMAL LOW (ref 3.80–5.20)
RDW: 19.7 % — ABNORMAL HIGH (ref 11.5–14.5)

## 2013-02-22 LAB — BASIC METABOLIC PANEL
Anion Gap: 7 (ref 7–16)
BUN: 13 mg/dL (ref 7–18)
Calcium, Total: 8.9 mg/dL (ref 8.5–10.1)
Co2: 28 mmol/L (ref 21–32)
Creatinine: 0.95 mg/dL (ref 0.60–1.30)
EGFR (African American): >60
EGFR (Non-African Amer.): 58 — ABNORMAL LOW
Glucose: 180 mg/dL — ABNORMAL HIGH (ref 65–99)
Sodium: 130 mmol/L — ABNORMAL LOW (ref 136–145)

## 2013-02-26 LAB — CULTURE, BLOOD (SINGLE)

## 2013-02-28 ENCOUNTER — Other Ambulatory Visit: Payer: Medicare Other

## 2013-03-01 ENCOUNTER — Ambulatory Visit (INDEPENDENT_AMBULATORY_CARE_PROVIDER_SITE_OTHER): Payer: Medicare Other | Admitting: Cardiovascular Disease

## 2013-03-01 ENCOUNTER — Encounter: Payer: Self-pay | Admitting: Cardiovascular Disease

## 2013-03-01 ENCOUNTER — Ambulatory Visit: Payer: Self-pay | Admitting: Internal Medicine

## 2013-03-01 VITALS — BP 110/56 | HR 99 | Ht <= 58 in | Wt 119.8 lb

## 2013-03-01 DIAGNOSIS — R0989 Other specified symptoms and signs involving the circulatory and respiratory systems: Secondary | ICD-10-CM

## 2013-03-01 DIAGNOSIS — R000 Tachycardia, unspecified: Secondary | ICD-10-CM

## 2013-03-01 DIAGNOSIS — R0602 Shortness of breath: Secondary | ICD-10-CM

## 2013-03-01 DIAGNOSIS — R0600 Dyspnea, unspecified: Secondary | ICD-10-CM

## 2013-03-01 NOTE — Progress Notes (Signed)
HPI  This is a pleasant 79 year old Caucasian female who is here today for a followup visit regarding dizziness and tachycardia. The patient has known history of COPD. 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 She was started on radiation and chemotherapy with Taxol and carboplatin on January 15 of 2014. She has no previous cardiac history.  She had recent sinus tachycardia. labs showed a creatinine of 0.9 with a slightly elevated BUN 20. D-dimer was elevated at 0.82. Hemoglobin was 9.9. Due to concerns about embolism she was sent for CTA of the chest which showed no evidence of pulmonary embolus. The lung mass was noted to be smaller in size. No pericardial effusion was noted. There was coronary calcifications in the LAD and RCA distribution.  She was also having problems with intermittent hypotension. During her last visit, I stopped lisinopril and change her metoprolol twice daily to be taken in addition to diltiazem. TSH was normal. She was hospitalized recently at [facility] for COPD exacerbation. She had an echocardiogram done which was reviewed. It showed normal LV systolic function with mild diastolic dysfunction. There was only trivial pericardial effusion and no significant valvular abnormalities.  Allergies  Allergen Reactions  . Ciprofloxacin   . Norvasc (Amlodipine Besylate)      Current Outpatient Prescriptions on File Prior to Visit  Medication Sig Dispense Refill  . albuterol (PROVENTIL HFA;VENTOLIN HFA) 108 (90 BASE) MCG/ACT inhaler Inhale 2 puffs into the lungs every 6 (six) hours as needed for wheezing.      . budesonide-formoterol (SYMBICORT) 80-4.5 MCG/ACT inhaler Inhale 2 puffs into the lungs as needed.       . Cyanocobalamin (VITAMIN B-12 IJ) Inject as directed every 30 (thirty) days.      Marland Kitchen diltiazem (DILACOR XR) 120 MG 24 hr capsule Take 120 mg by mouth daily.      . Multiple Vitamin (MULTIVITAMIN) tablet  Take 1 tablet by mouth daily.      Marland Kitchen omeprazole (PRILOSEC) 20 MG capsule Takes 20 mg am and 40 mg pm daily.      . potassium chloride (K-DUR,KLOR-CON) 10 MEQ tablet Take 10 mEq by mouth daily.      . Prenatal Vit-Fe Fumarate-FA (PRENAVITE MULTIPLE VITAMIN PO) Take by mouth daily.      Marland Kitchen tiotropium (SPIRIVA) 18 MCG inhalation capsule Place 18 mcg into inhaler and inhale daily.      . metoprolol (LOPRESSOR) 50 MG tablet Take 1 tablet (50 mg total) by mouth 2 (two) times daily.       No current facility-administered medications on file prior to visit.     Past Medical History  Diagnosis Date  . COPD (chronic obstructive pulmonary disease)   . GERD (gastroesophageal reflux disease)   . Hypertension   . Erythrocytosis   . Polycythemia   . Kidney failure   . Hyperlipidemia   . Lung cancer     squamous, stage 3     Past Surgical History  Procedure Laterality Date  . Colon resection    . Cataract extraction    . Hernia repair    . Total abdominal hysterectomy       Family History  Problem Relation Age of Onset  . Heart attack Father      History   Social History  . Marital Status: Widowed    Spouse Name: N/A    Number of Children: N/A  . Years of Education: N/A   Occupational  History  . Not on file.   Social History Main Topics  . Smoking status: Former Smoker -- 1.00 packs/day for 40 years  . Smokeless tobacco: Not on file  . Alcohol Use: No  . Drug Use: No  . Sexually Active: Not on file   Other Topics Concern  . Not on file   Social History Narrative  . No narrative on file      PHYSICAL EXAM   BP 110/56  Pulse 99  Ht 4\' 10"  (1.473 m)  Wt 119 lb 12 oz (54.318 kg)  BMI 25.03 kg/m2 Constitutional: She is oriented to person, place, and time. She appears well-developed and well-nourished. No distress.  HENT: No nasal discharge.  Head: Normocephalic and atraumatic.  Eyes: Pupils are equal and round. Right eye exhibits no discharge. Left eye exhibits  no discharge.  Neck: Normal range of motion. Neck supple. No JVD present. No thyromegaly present.  Cardiovascular: Normal rate, regular rhythm, normal heart sounds. Exam reveals no gallop and no friction rub. No murmur heard.  Pulmonary/Chest: Effort normal and breath sounds normal. No stridor. No respiratory distress. She has no wheezes. She has no rales. She exhibits no tenderness.  Abdominal: Soft. Bowel sounds are normal. She exhibits no distension. There is no tenderness. There is no rebound and no guarding.  Musculoskeletal: Normal range of motion. She exhibits no edema and no tenderness.  Neurological: She is alert and oriented to person, place, and time. Coordination normal.  Skin: Skin is warm and dry. No rash noted. She is not diaphoretic. No erythema. No pallor.  Psychiatric: She has a normal mood and affect. Her behavior is normal. Judgment and thought content normal.     AST:MHDQQI sinus rhythm   ASSESSMENT AND PLAN

## 2013-03-01 NOTE — Assessment & Plan Note (Signed)
She seems to have intermittent sinus tachycardia likely due to underlying lung disease. Thyroid function was normal. Echocardiogram was overall unremarkable. I recommend continuing treatment with metoprolol and diltiazem. Followup as needed.

## 2013-03-01 NOTE — Patient Instructions (Addendum)
Continue same medications.   Follow up as needed.  

## 2013-03-01 NOTE — Assessment & Plan Note (Signed)
Her dyspnea seems to be due to her lung disease with COPD and lung cancer.

## 2013-03-04 ENCOUNTER — Ambulatory Visit: Payer: Medicare Other | Admitting: Cardiovascular Disease

## 2013-03-05 ENCOUNTER — Ambulatory Visit: Payer: Self-pay | Admitting: Radiation Oncology

## 2013-04-04 ENCOUNTER — Ambulatory Visit: Payer: Self-pay | Admitting: Radiation Oncology

## 2013-04-30 LAB — CBC CANCER CENTER
Basophil %: 0.9 %
Eosinophil #: 0.1 x10 3/mm (ref 0.0–0.7)
Eosinophil %: 1.4 %
HGB: 10.2 g/dL — ABNORMAL LOW (ref 12.0–16.0)
MCH: 33.4 pg (ref 26.0–34.0)
MCHC: 34.6 g/dL (ref 32.0–36.0)
Monocyte %: 9.6 %
Neutrophil %: 71.1 %
Platelet: 157 x10 3/mm (ref 150–440)
RBC: 3.05 10*6/uL — ABNORMAL LOW (ref 3.80–5.20)

## 2013-04-30 LAB — HEPATIC FUNCTION PANEL A (ARMC)
Albumin: 3 g/dL — ABNORMAL LOW (ref 3.4–5.0)
Alkaline Phosphatase: 61 U/L (ref 50–136)
Bilirubin, Direct: <0.05 mg/dL (ref 0.00–0.20)
Bilirubin,Total: 0.2 mg/dL (ref 0.2–1.0)
SGPT (ALT): 31 U/L (ref 12–78)

## 2013-04-30 LAB — BASIC METABOLIC PANEL
Anion Gap: 11 (ref 7–16)
Calcium, Total: 9.2 mg/dL (ref 8.5–10.1)
Chloride: 101 mmol/L (ref 98–107)
Creatinine: 1.06 mg/dL (ref 0.60–1.30)
EGFR (African American): 59 — ABNORMAL LOW
EGFR (Non-African Amer.): 51 — ABNORMAL LOW
Osmolality: 283 (ref 275–301)
Sodium: 142 mmol/L (ref 136–145)

## 2013-05-05 ENCOUNTER — Ambulatory Visit: Payer: Self-pay | Admitting: Internal Medicine

## 2013-06-04 ENCOUNTER — Ambulatory Visit: Payer: Self-pay | Admitting: Radiation Oncology

## 2013-07-05 ENCOUNTER — Ambulatory Visit: Payer: Self-pay | Admitting: Radiation Oncology

## 2013-07-30 ENCOUNTER — Emergency Department: Payer: Self-pay | Admitting: Emergency Medicine

## 2013-07-30 LAB — COMPREHENSIVE METABOLIC PANEL
Albumin: 3.2 g/dL — ABNORMAL LOW (ref 3.4–5.0)
Anion Gap: 5 — ABNORMAL LOW (ref 7–16)
BUN: 12 mg/dL (ref 7–18)
Calcium, Total: 9.2 mg/dL (ref 8.5–10.1)
Chloride: 97 mmol/L — ABNORMAL LOW (ref 98–107)
Creatinine: 0.9 mg/dL (ref 0.60–1.30)
EGFR (Non-African Amer.): >60
Glucose: 93 mg/dL (ref 65–99)
Osmolality: 262 (ref 275–301)
Potassium: 3.8 mmol/L (ref 3.5–5.1)
SGPT (ALT): 22 U/L (ref 12–78)
Total Protein: 6.8 g/dL (ref 6.4–8.2)

## 2013-07-30 LAB — CBC
HGB: 10.5 g/dL — ABNORMAL LOW (ref 12.0–16.0)
MCH: 31.5 pg (ref 26.0–34.0)
MCHC: 34.9 g/dL (ref 32.0–36.0)
MCV: 90 fL (ref 80–100)
Platelet: 158 10*3/uL (ref 150–440)
RBC: 3.34 10*6/uL — ABNORMAL LOW (ref 3.80–5.20)
RDW: 13.6 % (ref 11.5–14.5)
WBC: 9.6 10*3/uL (ref 3.6–11.0)

## 2013-07-30 LAB — URINALYSIS, COMPLETE
Bacteria: NONE SEEN
Ketone: NEGATIVE
Ph: 7 (ref 4.5–8.0)
Protein: NEGATIVE
Specific Gravity: 1.011 (ref 1.003–1.030)

## 2013-07-31 ENCOUNTER — Ambulatory Visit: Payer: Self-pay | Admitting: Internal Medicine

## 2013-07-31 LAB — CBC CANCER CENTER
Basophil #: 0 "x10 3/mm "
Basophil %: 0.7 %
Eosinophil #: 0.2 "x10 3/mm "
Eosinophil %: 2.6 %
HCT: 29.9 % — ABNORMAL LOW
HGB: 10.7 g/dL — ABNORMAL LOW
Lymphocyte %: 22 %
Lymphs Abs: 1.4 "x10 3/mm "
MCH: 32.6 pg
MCHC: 35.9 g/dL
MCV: 91 fL
Monocyte #: 0.6 "x10 3/mm "
Monocyte %: 8.8 %
Neutrophil #: 4.2 "x10 3/mm "
Neutrophil %: 65.9 %
Platelet: 160 "x10 3/mm "
RBC: 3.29 "x10 6/mm " — ABNORMAL LOW
RDW: 13.8 %
WBC: 6.4 "x10 3/mm "

## 2013-07-31 LAB — URINE CULTURE

## 2013-07-31 LAB — BASIC METABOLIC PANEL
Anion Gap: 9 (ref 7–16)
BUN: 14 mg/dL (ref 7–18)
Calcium, Total: 9.2 mg/dL (ref 8.5–10.1)
Creatinine: 1.04 mg/dL (ref 0.60–1.30)
EGFR (Non-African Amer.): 52 — ABNORMAL LOW
Glucose: 123 mg/dL — ABNORMAL HIGH (ref 65–99)
Osmolality: 274 (ref 275–301)
Sodium: 136 mmol/L (ref 136–145)

## 2013-07-31 LAB — HEPATIC FUNCTION PANEL A (ARMC)
Albumin: 3.2 g/dL — ABNORMAL LOW (ref 3.4–5.0)
Bilirubin, Direct: 0.1 mg/dL (ref 0.00–0.20)
Bilirubin,Total: 0.6 mg/dL (ref 0.2–1.0)
SGOT(AST): 18 U/L (ref 15–37)
SGPT (ALT): 23 U/L (ref 12–78)

## 2013-08-04 LAB — CULTURE, BLOOD (SINGLE)

## 2013-08-05 ENCOUNTER — Ambulatory Visit: Payer: Self-pay

## 2013-08-05 ENCOUNTER — Ambulatory Visit: Payer: Self-pay | Admitting: Radiation Oncology

## 2013-11-05 ENCOUNTER — Ambulatory Visit: Payer: Self-pay | Admitting: Internal Medicine

## 2013-11-05 LAB — CBC CANCER CENTER
Basophil #: 0 x10 3/mm (ref 0.0–0.1)
Eosinophil #: 0.1 x10 3/mm (ref 0.0–0.7)
Eosinophil %: 2.2 %
HCT: 31.1 % — ABNORMAL LOW (ref 35.0–47.0)
Lymphocyte #: 0.9 x10 3/mm — ABNORMAL LOW (ref 1.0–3.6)
Lymphocyte %: 20.1 %
MCH: 31.8 pg (ref 26.0–34.0)
MCHC: 34.1 g/dL (ref 32.0–36.0)
MCV: 93 fL (ref 80–100)
Neutrophil #: 2.8 x10 3/mm (ref 1.4–6.5)
Neutrophil %: 66.2 %
RBC: 3.33 10*6/uL — ABNORMAL LOW (ref 3.80–5.20)

## 2013-11-05 LAB — BASIC METABOLIC PANEL
Calcium, Total: 8.9 mg/dL (ref 8.5–10.1)
Co2: 28 mmol/L (ref 21–32)
Creatinine: 0.98 mg/dL (ref 0.60–1.30)
EGFR (African American): >60
EGFR (Non-African Amer.): 56 — ABNORMAL LOW
Glucose: 98 mg/dL (ref 65–99)

## 2013-11-05 LAB — HEPATIC FUNCTION PANEL A (ARMC)
Bilirubin, Direct: <0.05 mg/dL (ref 0.00–0.20)
Total Protein: 6.5 g/dL (ref 6.4–8.2)

## 2013-11-19 ENCOUNTER — Inpatient Hospital Stay: Payer: Self-pay

## 2013-11-19 LAB — CBC
HCT: 32 % — ABNORMAL LOW (ref 35.0–47.0)
HGB: 10.8 g/dL — ABNORMAL LOW (ref 12.0–16.0)
MCHC: 33.7 g/dL (ref 32.0–36.0)
Platelet: 210 10*3/uL (ref 150–440)
RBC: 3.52 10*6/uL — ABNORMAL LOW (ref 3.80–5.20)
WBC: 12.8 10*3/uL — ABNORMAL HIGH (ref 3.6–11.0)

## 2013-11-19 LAB — BASIC METABOLIC PANEL
BUN: 14 mg/dL (ref 7–18)
Calcium, Total: 9 mg/dL (ref 8.5–10.1)
Chloride: 104 mmol/L (ref 98–107)
Co2: 28 mmol/L (ref 21–32)
Creatinine: 0.8 mg/dL (ref 0.60–1.30)
Osmolality: 277 (ref 275–301)
Potassium: 3.5 mmol/L (ref 3.5–5.1)

## 2013-11-19 LAB — TROPONIN I: Troponin-I: <0.02 ng/mL

## 2013-11-20 LAB — BASIC METABOLIC PANEL
Anion Gap: 8 (ref 7–16)
Calcium, Total: 9.3 mg/dL (ref 8.5–10.1)
Creatinine: 0.87 mg/dL (ref 0.60–1.30)
EGFR (African American): >60
Glucose: 159 mg/dL — ABNORMAL HIGH (ref 65–99)
Osmolality: 287 (ref 275–301)
Potassium: 3.4 mmol/L — ABNORMAL LOW (ref 3.5–5.1)
Sodium: 140 mmol/L (ref 136–145)

## 2013-11-20 LAB — CBC WITH DIFFERENTIAL/PLATELET
Basophil #: 0 10*3/uL (ref 0.0–0.1)
Basophil %: 0 %
Lymphocyte %: 7 %
MCH: 31.6 pg (ref 26.0–34.0)
Monocyte #: 0.2 x10 3/mm (ref 0.2–0.9)
Monocyte %: 2 %
Neutrophil #: 8.2 10*3/uL — ABNORMAL HIGH (ref 1.4–6.5)
Neutrophil %: 91 %
Platelet: 201 10*3/uL (ref 150–440)
RBC: 3.26 10*6/uL — ABNORMAL LOW (ref 3.80–5.20)
RDW: 14.4 % (ref 11.5–14.5)
WBC: 9 10*3/uL (ref 3.6–11.0)

## 2013-12-05 ENCOUNTER — Ambulatory Visit: Payer: Self-pay | Admitting: Internal Medicine

## 2013-12-14 ENCOUNTER — Emergency Department: Payer: Self-pay | Admitting: Emergency Medicine

## 2014-01-05 ENCOUNTER — Ambulatory Visit: Payer: Self-pay | Admitting: Internal Medicine

## 2014-05-08 ENCOUNTER — Ambulatory Visit: Payer: Self-pay | Admitting: Internal Medicine

## 2014-05-14 LAB — CBC CANCER CENTER
Basophil #: 0.1 x10 3/mm (ref 0.0–0.1)
Basophil %: 0.9 %
EOS PCT: 1 %
Eosinophil #: 0.1 x10 3/mm (ref 0.0–0.7)
HCT: 31.3 % — ABNORMAL LOW (ref 35.0–47.0)
HGB: 10.6 g/dL — AB (ref 12.0–16.0)
LYMPHS PCT: 18.2 %
Lymphocyte #: 1.4 x10 3/mm (ref 1.0–3.6)
MCH: 32 pg (ref 26.0–34.0)
MCHC: 34 g/dL (ref 32.0–36.0)
MCV: 94 fL (ref 80–100)
MONO ABS: 0.6 x10 3/mm (ref 0.2–0.9)
Monocyte %: 8.3 %
NEUTROS PCT: 71.6 %
Neutrophil #: 5.6 x10 3/mm (ref 1.4–6.5)
Platelet: 228 x10 3/mm (ref 150–440)
RBC: 3.33 10*6/uL — ABNORMAL LOW (ref 3.80–5.20)
RDW: 13 % (ref 11.5–14.5)
WBC: 7.8 x10 3/mm (ref 3.6–11.0)

## 2014-05-14 LAB — HEPATIC FUNCTION PANEL A (ARMC)
ALBUMIN: 3.4 g/dL (ref 3.4–5.0)
ALK PHOS: 113 U/L
ALT: 28 U/L (ref 12–78)
Bilirubin,Total: 0.2 mg/dL (ref 0.2–1.0)
SGOT(AST): 13 U/L — ABNORMAL LOW (ref 15–37)
Total Protein: 6.9 g/dL (ref 6.4–8.2)

## 2014-05-14 LAB — CREATININE, SERUM
Creatinine: 0.97 mg/dL (ref 0.60–1.30)
EGFR (African American): >60
GFR CALC NON AF AMER: 56 — AB

## 2014-05-14 LAB — CALCIUM: Calcium, Total: 9.2 mg/dL (ref 8.5–10.1)

## 2014-06-04 ENCOUNTER — Ambulatory Visit: Payer: Self-pay | Admitting: Internal Medicine

## 2014-08-13 ENCOUNTER — Emergency Department: Payer: Self-pay | Admitting: Emergency Medicine

## 2014-08-13 LAB — COMPREHENSIVE METABOLIC PANEL
ALK PHOS: 86 U/L
ANION GAP: 8 (ref 7–16)
Albumin: 3.2 g/dL — ABNORMAL LOW (ref 3.4–5.0)
BUN: 18 mg/dL (ref 7–18)
Bilirubin,Total: 0.4 mg/dL (ref 0.2–1.0)
CALCIUM: 8.6 mg/dL (ref 8.5–10.1)
CHLORIDE: 98 mmol/L (ref 98–107)
Co2: 28 mmol/L (ref 21–32)
Creatinine: 1.22 mg/dL (ref 0.60–1.30)
EGFR (African American): 49 — ABNORMAL LOW
EGFR (Non-African Amer.): 42 — ABNORMAL LOW
GLUCOSE: 103 mg/dL — AB (ref 65–99)
Osmolality: 270 (ref 275–301)
POTASSIUM: 3.9 mmol/L (ref 3.5–5.1)
SGOT(AST): 16 U/L (ref 15–37)
SGPT (ALT): 20 U/L
Sodium: 134 mmol/L — ABNORMAL LOW (ref 136–145)
Total Protein: 6.7 g/dL (ref 6.4–8.2)

## 2014-08-13 LAB — URINALYSIS, COMPLETE
Bacteria: NONE SEEN
Bilirubin,UR: NEGATIVE
Blood: NEGATIVE
GLUCOSE, UR: NEGATIVE mg/dL (ref 0–75)
LEUKOCYTE ESTERASE: NEGATIVE
NITRITE: NEGATIVE
PH: 6 (ref 4.5–8.0)
Protein: NEGATIVE
RBC,UR: NONE SEEN /HPF (ref 0–5)
Specific Gravity: 1.004 (ref 1.003–1.030)
Squamous Epithelial: 1
WBC UR: 1 /HPF (ref 0–5)

## 2014-08-13 LAB — TROPONIN I

## 2014-08-13 LAB — CBC
HCT: 30.8 % — ABNORMAL LOW (ref 35.0–47.0)
HGB: 10.3 g/dL — ABNORMAL LOW (ref 12.0–16.0)
MCH: 31.7 pg (ref 26.0–34.0)
MCHC: 33.5 g/dL (ref 32.0–36.0)
MCV: 95 fL (ref 80–100)
PLATELETS: 148 10*3/uL — AB (ref 150–440)
RBC: 3.25 10*6/uL — ABNORMAL LOW (ref 3.80–5.20)
RDW: 13.3 % (ref 11.5–14.5)
WBC: 8.3 10*3/uL (ref 3.6–11.0)

## 2014-08-13 LAB — SEDIMENTATION RATE: ERYTHROCYTE SED RATE: 61 mm/h — AB (ref 0–30)

## 2014-12-01 ENCOUNTER — Ambulatory Visit: Payer: Self-pay | Admitting: Internal Medicine

## 2014-12-01 LAB — CBC CANCER CENTER
Basophil #: 0 x10 3/mm (ref 0.0–0.1)
Basophil %: 0.7 %
EOS PCT: 1.7 %
Eosinophil #: 0.1 x10 3/mm (ref 0.0–0.7)
HCT: 31 % — AB (ref 35.0–47.0)
HGB: 10.3 g/dL — ABNORMAL LOW (ref 12.0–16.0)
LYMPHS ABS: 1.2 x10 3/mm (ref 1.0–3.6)
Lymphocyte %: 18.6 %
MCH: 30.7 pg (ref 26.0–34.0)
MCHC: 33.4 g/dL (ref 32.0–36.0)
MCV: 92 fL (ref 80–100)
Monocyte #: 0.6 x10 3/mm (ref 0.2–0.9)
Monocyte %: 10.2 %
NEUTROS ABS: 4.4 x10 3/mm (ref 1.4–6.5)
Neutrophil %: 68.8 %
Platelet: 188 x10 3/mm (ref 150–440)
RBC: 3.37 10*6/uL — ABNORMAL LOW (ref 3.80–5.20)
RDW: 13.8 % (ref 11.5–14.5)
WBC: 6.3 x10 3/mm (ref 3.6–11.0)

## 2014-12-01 LAB — BASIC METABOLIC PANEL
ANION GAP: 8 (ref 7–16)
BUN: 16 mg/dL (ref 7–18)
CALCIUM: 8.5 mg/dL (ref 8.5–10.1)
CO2: 30 mmol/L (ref 21–32)
CREATININE: 1.15 mg/dL (ref 0.60–1.30)
Chloride: 103 mmol/L (ref 98–107)
EGFR (African American): 59 — ABNORMAL LOW
GFR CALC NON AF AMER: 49 — AB
GLUCOSE: 101 mg/dL — AB (ref 65–99)
Osmolality: 283 (ref 275–301)
POTASSIUM: 3.7 mmol/L (ref 3.5–5.1)
Sodium: 141 mmol/L (ref 136–145)

## 2014-12-01 LAB — HEPATIC FUNCTION PANEL A (ARMC)
ALBUMIN: 3.2 g/dL — AB (ref 3.4–5.0)
ALK PHOS: 100 U/L
BILIRUBIN TOTAL: 0.3 mg/dL (ref 0.2–1.0)
Bilirubin, Direct: 0.1 mg/dL (ref 0.0–0.2)
SGOT(AST): 14 U/L — ABNORMAL LOW (ref 15–37)
SGPT (ALT): 22 U/L
TOTAL PROTEIN: 6.3 g/dL — AB (ref 6.4–8.2)

## 2014-12-05 ENCOUNTER — Ambulatory Visit: Payer: Self-pay | Admitting: Internal Medicine

## 2014-12-10 DIAGNOSIS — J449 Chronic obstructive pulmonary disease, unspecified: Secondary | ICD-10-CM | POA: Diagnosis not present

## 2014-12-27 DIAGNOSIS — J449 Chronic obstructive pulmonary disease, unspecified: Secondary | ICD-10-CM | POA: Diagnosis not present

## 2014-12-29 ENCOUNTER — Emergency Department: Payer: Self-pay | Admitting: Emergency Medicine

## 2014-12-29 DIAGNOSIS — R918 Other nonspecific abnormal finding of lung field: Secondary | ICD-10-CM | POA: Diagnosis not present

## 2014-12-29 DIAGNOSIS — J189 Pneumonia, unspecified organism: Secondary | ICD-10-CM | POA: Diagnosis not present

## 2014-12-29 DIAGNOSIS — J441 Chronic obstructive pulmonary disease with (acute) exacerbation: Secondary | ICD-10-CM | POA: Diagnosis not present

## 2014-12-29 DIAGNOSIS — R0602 Shortness of breath: Secondary | ICD-10-CM | POA: Diagnosis not present

## 2014-12-29 LAB — BASIC METABOLIC PANEL
ANION GAP: 6 — AB (ref 7–16)
BUN: 15 mg/dL (ref 7–18)
CHLORIDE: 102 mmol/L (ref 98–107)
CO2: 28 mmol/L (ref 21–32)
Calcium, Total: 8.8 mg/dL (ref 8.5–10.1)
Creatinine: 1.23 mg/dL (ref 0.60–1.30)
EGFR (African American): 54 — ABNORMAL LOW
EGFR (Non-African Amer.): 45 — ABNORMAL LOW
GLUCOSE: 115 mg/dL — AB (ref 65–99)
OSMOLALITY: 274 (ref 275–301)
Potassium: 4 mmol/L (ref 3.5–5.1)
Sodium: 136 mmol/L (ref 136–145)

## 2014-12-29 LAB — CBC
HCT: 32.1 % — ABNORMAL LOW (ref 35.0–47.0)
HGB: 10.8 g/dL — ABNORMAL LOW (ref 12.0–16.0)
MCH: 31.5 pg (ref 26.0–34.0)
MCHC: 33.6 g/dL (ref 32.0–36.0)
MCV: 94 fL (ref 80–100)
Platelet: 165 10*3/uL (ref 150–440)
RBC: 3.42 10*6/uL — AB (ref 3.80–5.20)
RDW: 13.4 % (ref 11.5–14.5)
WBC: 12.2 10*3/uL — AB (ref 3.6–11.0)

## 2014-12-29 LAB — TROPONIN I: Troponin-I: <0.02 ng/mL

## 2015-01-10 DIAGNOSIS — J449 Chronic obstructive pulmonary disease, unspecified: Secondary | ICD-10-CM | POA: Diagnosis not present

## 2015-01-14 ENCOUNTER — Ambulatory Visit: Payer: Self-pay | Admitting: Internal Medicine

## 2015-01-14 DIAGNOSIS — C342 Malignant neoplasm of middle lobe, bronchus or lung: Secondary | ICD-10-CM | POA: Diagnosis not present

## 2015-01-27 DIAGNOSIS — J449 Chronic obstructive pulmonary disease, unspecified: Secondary | ICD-10-CM | POA: Diagnosis not present

## 2015-02-03 ENCOUNTER — Ambulatory Visit
Admit: 2015-02-03 | Disposition: A | Discharge status: Home / Self Care | Payer: Self-pay | Attending: Internal Medicine | Admitting: Internal Medicine

## 2015-02-08 DIAGNOSIS — J449 Chronic obstructive pulmonary disease, unspecified: Secondary | ICD-10-CM | POA: Diagnosis not present

## 2015-02-25 DIAGNOSIS — J449 Chronic obstructive pulmonary disease, unspecified: Secondary | ICD-10-CM | POA: Diagnosis not present

## 2015-03-11 DIAGNOSIS — D649 Anemia, unspecified: Secondary | ICD-10-CM | POA: Diagnosis not present

## 2015-03-11 DIAGNOSIS — M353 Polymyalgia rheumatica: Secondary | ICD-10-CM | POA: Diagnosis not present

## 2015-03-11 DIAGNOSIS — J449 Chronic obstructive pulmonary disease, unspecified: Secondary | ICD-10-CM | POA: Diagnosis not present

## 2015-03-20 ENCOUNTER — Other Ambulatory Visit: Payer: Self-pay | Admitting: Internal Medicine

## 2015-03-20 DIAGNOSIS — C3490 Malignant neoplasm of unspecified part of unspecified bronchus or lung: Secondary | ICD-10-CM

## 2015-03-24 NOTE — Consult Note (Signed)
PATIENT NAME:  Bethany Lambert, Bethany Lambert MR#:  [id number] DATE OF BIRTH:  1934/09/23  DATE OF CONSULTATION:  11/21/2012  REFERRING PHYSICIAN:  Nestor Lewandowsky, MD CONSULTING PHYSICIAN:  Caliah Kopke R. Ma Hillock, MD  REASON FOR CONSULTATION: Newly diagnosed squamous cell lung cancer.   HISTORY OF PRESENT ILLNESS: The patient is a 81 year old female who has been admitted to the hospital on December 16th.  The patient was recently found to have a left hilar mass on CT scan and PET scan and initially underwent bronchoscopy which did not give a diagnosis and therefore percutaneous biopsy on December 16th. She developed pneumothorax from this and currently is in hospital with a chest tube. She is on oxygen, she has been on oxygen at home before for COPD. States that she is comfortable at rest, but has dyspnea on activity. Denies any major pain issues. Biopsy report is consistent with squamous cell carcinoma. Oncology is consulted for newly diagnosed non-small cell lung cancer.   PAST MEDICAL/SURGICAL HISTORY:  1. COPD. 2. GERD.  3. Colon dissection. 4. Hysterectomy.  5. Hypertension. 6. Cataract surgery.  7. Hernia repair. 8. Erythrocytosis (secondary).  FAMILY HISTORY: Denies malignancy. Remarkable for heart disease.   SOCIAL HISTORY: Denies smoking, currently. History of 40 pack-year in the past, quit about 12 years ago. Denies alcohol usage. States that she is physically active up until this admission.   ALLERGIES: CIPRO AND NORVASC.   HOME MEDICATIONS: 1. Cetirizine 10 mg daily.  2. Cefuroxime 250 mg twice a day. 3. Cardizem CD 120 mg daily. 4. Lisinopril 20 mg daily.  5. Metoprolol 50 mg twice a day. 6. Multivitamin 1 daily.  7. Omeprazole 40 mg daily.  8. Potassium chloride 10 milliequivalents daily.  9. Prednisone taper.  10. Spiriva 18 mcg inhaler daily.  11. Symbicort inhaler 2 puffs every 12 hours. 12. Ventolin 2 puffs q. 6 hours p.r.n.   REVIEW OF SYSTEMS:  CONSTITUTIONAL: Chronic  dyspnea on exertion from COPD, on home oxygen. Denies fevers or chills. No night sweats or chills.   HEENT: Denies headaches, dizziness, epistaxis, ear or jaw pain.   CARDIAC: No angina, palpitation, orthopnea or paroxysmal nocturnal dyspnea.  LUNGS: As in history of present illness. No hemoptysis or chest pain.   GASTROINTESTINAL: Denies nausea, vomiting or diarrhea. Denies bright red blood in stools or melena.   GENITOURINARY: No dysuria or hematuria.   SKIN: No new rashes or pruritus.   HEMATOLOGIC: Denies bleeding symptoms.   MUSCULOSKELETAL: Chronic arthritis in back and lower extremities, denies new bone pains.   NEUROLOGIC: Denies focal weakness, seizures, headaches or loss of consciousness.   ENDOCRINE: No polyuria or polydipsia. Appetite is steady.      PHYSICAL EXAMINATION:  GENERAL: The patient is resting in bed, on nasal cannula oxygen, otherwise alert and oriented x 4 and converses appropriately. No acute distress. No icterus. Mild pallor.   VITAL SIGNS: 99.3, 79, 20, 141/53 and 97% on 3 liters.   HEENT: Normocephalic, atraumatic. Extraocular movements intact. Sclera anicteric. No oral thrush.   NECK: Negative for lymphadenopathy.  CARDIOVASCULAR: S1 and S2, regular rate and rhythm.   PULMONARY: Lungs show bilateral diminished breath sounds overall, more so in the left upper lobe area. Has chest tube. No rhonchi.   ABDOMEN: Soft, nontender. No hepatosplenomegaly clinically.   EXTREMITIES: No major edema or cyanosis.   SKIN: No generalized rashes or major bruising.   MUSCULOSKELETAL: No obvious joint deformity or swelling.   NEUROLOGIC: Limited examination. Cranial nerves are intact, moves all extremities  spontaneously.   LAB RESULTS: Hemoglobin 10.7, platelets 186, WBC 10,800 and ANC 7700. Creatinine 0.74, potassium 3.7 and calcium 8.7.   RADIOLOGIC DATA: Chest x-ray earlier today shows increased size of the patient's left pneumothorax.   PET scan on  11/14/2012 showed intensely PET-positive left suprahilar mass most consistent with lung cancer, no other PET-positive abnormalities noted.   IMPRESSION AND RECOMMENDATION: A 81 year old female patient with history of oxygen-dependent COPD was found to have intensely PET-positive right suprahilar mass and underwent percutaneous biopsy with pathology consistent with squamous cell non-small cell lung cancer. The patient has pneumothorax after procedure and is currently having chest tube, per discussion with surgeon, Dr. Genevive Bi, she is likely to need chest tube for at least a few more days. Based on review of CT scan and PET scan, left hilar mass seems to be growing into mediastinum which makes it likely T4 tumor and the patient explained that she likely has at least stage IIIa non-small cell lung cancer. Per Dr. Genevive Bi, she is not a surgical candidate given advanced COPD and T4 tumor. Plan therefore is to await removal of chest tube and then plan treatment with concurrent chemoradiation based on her condition at that time. Will go ahead and consult radiation oncologist at this time to evaluate her and plan radiation once chest tube comes out. The patient explained that concurrent chemotherapy would likely be weekly Taxol/Carboplatin, she is unsure if she will take concurrent chemotherapy, but will make decision as outpatient in the near future. I have also explained overall response rates and possible outcomes of above chemoradiation. The patient otherwise does not have any ongoing hemoptysis or acute pain issues at this time.  If the patient is discharged soon, will follow up as outpatient and make further treatment planning. She was explained above details, agreeable to this plan.   Thank you for the referral. Please feel free to contact me if any additional questions.  ____________________________ Rhett Bannister Ma Hillock, MD srp:[id number] D: 11/22/2012 00:32:00 ET T: 11/22/2012 07:06:01 ET JOB#: [id number]  cc: Verneda Hollopeter R.  Ma Hillock, MD, <Dictator> Alveta Heimlich MD ELECTRONICALLY SIGNED 11/22/2012 15:11

## 2015-03-24 NOTE — Consult Note (Signed)
PATIENT NAME:  Bethany Lambert, Bethany Lambert MR#:  [id number] DATE OF BIRTH:  [birth year]  DATE OF CONSULTATION:  11/14/2012  REFERRING PHYSICIAN:   Dr. Mortimer Fries CONSULTING PHYSICIAN:  Lew Dawes. Genevive Bi, MD  Bethany Lambert have personally seen and examined Bethany Lambert. Bethany Lambert have independently reviewed Bethany Lambert chest x-rays and CT. Bethany Lambert discussed Bethany Lambert care with Dr. Mortimer Fries.  HISTORY OF PRESENT ILLNESS: Bethany Lambert is a very pleasant 81 year old woman with a longstanding history of chronic obstructive pulmonary disease. Bethany Lambert also has significant gastroesophageal reflux. Bethany Lambert was recently admitted to the hospital with a new left lower lobe pneumonia. Bethany Lambert had a chest CT scan done and this was compared to several prior CT scans, which revealed a left upper lobe mass. Bethany Lambert underwent bronchoscopy today by Dr. Mortimer Fries; however, no diagnostic tissue could be obtained. The patient has a long smoking history but quit 12 years ago. Bethany Lambert does use oxygen 24 hours a day. Bethany Lambert is on 2 liters at home. Bethany Lambert states that Bethany Lambert is able to walk up a flight of stairs, but Bethany Lambert would have to stop at the top and be very fatigued. Bethany Lambert feels that recently Bethany Lambert has had another episode of bronchitis or pneumonia which required admission to the hospital because of progressive shortness of breath.   PAST MEDICAL HISTORY:  1. Hypertension.  2. Chronic obstructive pulmonary disease. 3. Reflux.   PAST SURGICAL HISTORY: 1. Partial colectomy.  2. Hysterectomy.  3. Cataract surgery. 4. Hernias.   FAMILY HISTORY: Positive for coronary disease and coronary artery bypass surgery in Bethany Lambert father. Bethany Lambert thinks that his heart disease was in some way related to his smoking. There is no other history of cancers in Bethany Lambert family.   SOCIAL HISTORY:  Bethany Lambert lives at home with Bethany Lambert son. Bethany Lambert lives with him and his three children. Bethany Lambert does not drink or smoke at the present time. Bethany Lambert does have a 40-year smoking history of 2 packs per day but none in the last 12 years.   REVIEW OF SYSTEMS: As per  history of present illness and all other review of systems were asked and were negative. Specifically, Bethany Lambert denied any hemoptysis or weight loss.   PHYSICAL EXAMINATION:  GENERAL: Pleasant, slightly obese white female in no acute distress.   HEENT: Normocephalic. The pupils were equal. Oropharynx was moist.   NECK: Supple without thyromegaly or adenopathy.   LYMPH: There were no palpable lymph nodes in the supraclavicular or cervical regions.   LUNGS: Bethany Lambert lungs are markedly diminished throughout. Bethany Lambert did not appreciate any wheezing.   HEART: Bethany Lambert heart was regular.   ABDOMEN:  Soft, nontender, and nondistended. There were no palpable masses.    EXTREMITIES: Good pulses throughout without clubbing, cyanosis, or edema.   NEUROLOGIC AND PSYCHIATRIC: Grossly within normal limits.   ASSESSMENT AND PLAN: Bethany Lambert have independently reviewed with the patient and Bethany Lambert daughter the results of the CT scan and PET scan. We went over the films and discussed the location of the tumor as well as options. At the present time this tumor appears to be involving the mediastinum, which would make it a T4 lesion. This is unresectable. Bethany Lambert would recommend however, that Bethany Lambert undergo percutaneous needle biopsy. Once a diagnosis has been established Bethany Lambert is interested in pursuing additional therapy if there is any available. Bethany Lambert told Bethany Lambert that it was my opinion that this is most likely a non-small cell carcinoma of the lung. Bethany Lambert would like [place] to pursue further evaluation.   Thank you very much for  allowing me to participate in Bethany Lambert care. If there is anything further Bethany Lambert can do, please do not hesitate to call.       Unfortunately, at the present time I do not think that Bethany Lambert is a surgical candidate.     ____________________________ Lew Dawes. Genevive Bi, MD [id number] D: 11/14/2012 12:50:29 ET T: 11/14/2012 13:11:14 ET JOB#: [id number]  cc: Christia Reading E. Genevive Bi, MD, <Dictator> Louis Matte MD ELECTRONICALLY SIGNED 11/20/2012 6:42

## 2015-03-24 NOTE — Consult Note (Signed)
Chief Complaint:   Subjective/Chief Complaint patient had PET scan done and this does show increased uptake in the left hilar area. She and I looked at the scan with her family present and I explained the EBUS procedure to them including risks and benefits for the procedure   VITAL SIGNS/ANCILLARY NOTES: **Vital Signs.:   10-Dec-13 15:45   Vital Signs Type Routine   Temperature Temperature (F) 97.6   Celsius 36.4   Temperature Source oral   Pulse Pulse 65   Respirations Respirations 20   Systolic BP Systolic BP 917   Diastolic BP (mmHg) Diastolic BP (mmHg) 62   Mean BP 89   BP Source  if not from Vital Sign Device non-invasive   Pulse Ox % Pulse Ox % 98   Pulse Ox Activity Level  At rest   Oxygen Delivery 2L  *Intake and Output.:   Shift 10-Dec-13 23:00   Grand Totals Intake:   Output:  300    Net:  -300 24 Hr.:  -300   Urine ml     Out:  300   Length of Stay Totals Intake:  5601 Output:  13100    Net:  -7499   Brief Assessment:   Cardiac Regular  -- LE edema  --Gallop    Respiratory normal resp effort  clear BS  no use of accessory muscles    Gastrointestinal details normal Soft  Bowel sounds normal  No rigidity   Assessment/Plan:  Assessment/Plan:   Assessment left lung mass -will need biopsy -will schedule for bronch with EBUS   Electronic Signatures: Allyne Gee (MD)  (Signed 10-Dec-13 16:53)  Authored: Chief Complaint, VITAL SIGNS/ANCILLARY NOTES, Brief Assessment, Assessment/Plan   Last Updated: 10-Dec-13 16:53 by Allyne Gee (MD)

## 2015-03-24 NOTE — H&P (Signed)
PATIENT NAME:  Bethany Lambert, Bethany Lambert MR#:  [id number] DATE OF BIRTH:  May 21, 1934  DATE OF ADMISSION:  11/19/2012  HISTORY OF PRESENT ILLNESS: The patient is a 81 year old woman with a long-standing history of chronic obstructive pulmonary disease. She is oxygen dependent. She is a patient of Dr. Devona Konig, who was originally diagnosed as having a pneumonia several months ago. She underwent therapy for that, but was found to have a left upper lobe mass. She was recently discharged from the hospital after an attempt was made by bronchoscopy to make a diagnosis. She could not have a diagnosis made that way, so she was offered a percutaneous biopsy. That was performed earlier today. During the course the procedure, she developed a pneumothorax which required insertion of a chest tube. That has been performed with prompt re-expansion of her lung. Currently, she is resting comfortably in bed. She states that she does have some discomfort from her biopsy site, but otherwise is unchanged. She has had no recent acute exacerbations of her COPD. She is on a prednisone taper and is currently taking 30 mg a day. Otherwise, her baseline shortness of breath and COPD are stable.   PAST MEDICAL HISTORY: Significant for hypertension, COPD, gastroesophageal reflux, colon resection, hysterectomy, cataract surgery and hernia repair.   FAMILY HISTORY: Positive for coronary artery disease. There is no family history of malignancy.   SOCIAL HISTORY: She lives at home with her son. She does not drink or smoke at the present time. She does have a 40 year history of smoking 2 packs a day, but quit 12 years ago.   REVIEW OF SYSTEMS: As per history of present illness and all other review of systems were asked and were negative.   PHYSICAL EXAMINATION:  GENERAL: A pleasant, white female in no acute distress. She did have a chest tube in place and was inspiring nasal cannula oxygen.  HEENT: Head normocephalic. There was no palpable  masses in her neck or supraclavicular space.  LUNGS: Rhonchorous and distant bilaterally, but equal.  HEART: Regular. There were no murmurs.  ABDOMEN: Soft, nontender, nondistended. There were no palpable masses.  EXTREMITIES: No clubbing, cyanosis, or edema. She had good pulses throughout.  NEUROLOGIC/PSYCHIATRIC: Grossly within normal limits. She did have a chest tube in place with a small air leak on 20 cm of water suction.   ASSESSMENT AND PLAN: Pneumothorax, status post lung biopsy. We will admit the patient for management of her chest tube. We will continue the suction on her chest tube. We will obtain daily x-rays. We will consult Dr. Devona Konig for management of her underlying chronic obstructive pulmonary disease. We will check on the pathology when available and have our oncologist see the patient to determine potential therapies. She is not a surgical candidate for resection.    ____________________________ Lew Dawes Genevive Bi, MD teo:aw D: 11/20/2012 06:25:03 ET T: 11/20/2012 08:38:12 ET JOB#: [id number]  cc: Christia Reading E. Genevive Bi, MD, <Dictator> Louis Matte MD ELECTRONICALLY SIGNED 11/25/2012 5:03

## 2015-03-24 NOTE — Consult Note (Signed)
Reason for Visit: This 81 year old Female patient presents to the clinic for lung cancer .   Referred by Dr. Ma Hillock.  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 T4 lesion by direct extension into the mediastinum and great vessels   Pathology Report pathology report reviewed    Imaging Report PET/CT scan and CT scans reviewed    Referral Report clinical notes reviewed    Planned Treatment Regimen IMRT radiation therapy with possible concurrent chemotherapy    HPI   patient is a 81 year old female long-standing history of COPD and is oxygen dependent. She presented around Thanksgiving with pneumonia and when I clear there was a left upper lobe lung mass noted. This was intensely hyper avid on PET/CT scan. She underwent a fine-needle guided percutaneous needle biopsy of the mass which was positive for squamous cell carcinoma. Unfortunately she did develop a pneumothorax and has had a chest tube placed by Dr. Faith Rogue. She seen today in her hospital room is recovering nicely from her biopsy. She has no cough hemoptysis or chest tightness at this time. She is on prednisone taper. She's also been evaluated by surgical oncology and not thought to be a surgical candidate. PET/CT scan also demonstrated direct extension into the great vessels of the chest making this a T4 lesion. Dr. Ma Hillock has evaluated the patient and made a recommendation for concurrent chemotherapy and radiation therapy.  Past Hx:    GI bleed:    GERD:    Carpal tunnel syndrome:    HTN:    pancreatitis:    renal failure:    COPD:    polycythemia:    Ventral hernia repair: 2005   colon resection:    hysterectomy:   Past, Family and Social History:   Past Medical History positive    Cardiovascular hypertension    Respiratory COPD    Gastrointestinal GERD; pancreatitis GI bleed    Genitourinary renal failure     Past Surgical History hysterectomy, colon resection, ventral hernia repair    Past Medical History Comments carpal tunnel syndrome    Family History positive    Family History Comments family history positive for coronary artery disease no family history of cancer    Social History positive    Social History Comments 80pack-year smoking history quit smoking 12 years prior, no EtOH abuse history    Additional Past Medical and Surgical History seen in her hospital room by yourself today.   Allergies:   Cipro: Other  Norvasc: Other  Home Meds:  Home Medications: Medication Instructions Status  lisinopril 20 mg oral tablet 1 tab(s) orally once a day Active  Diltiazem Hydrochloride CD 120 mg/24 hours oral capsule, extended release 1 cap(s) orally once a day Active  metoprolol tartrate 50 mg oral tablet 1 tab(s) orally 2 times a day Active  omeprazole 40 mg oral delayed release capsule 1 cap(s) orally once a day 30 minutes prior to evening meal Active  potassium chloride 10 mEq oral tablet, extended release 1 tab(s) orally once a day Active  multivitamin, prenatal 1 tab(s) orally once a day Active  Spiriva 18 mcg inhalation capsule 1 cap(s) via handihaler inhaled once a day  Active  Symbicort 80 mcg-4.5 mcg/inh inhalation aerosol 2 puff(s) inhaled every 12 hours Active  Ventolin HFA 90 mcg/inh inhalation aerosol 2 puff(s) inhaled every 6 hours, As Needed- for Shortness of Breath  Active  cetirizine 10  mg oral tablet 1 tab(s) orally once a day for allergies/sinus drainage Active  predniSONE 10 mg oral tablet tab(s) orally as directed on taper '30mg'$  today.Marland KitchenMarland KitchenMondaY 12-16 Active  cefuroxime 250 mg oral tablet 1 tab(s) orally 2 times a day Active   Review of Systems:   General negative    Performance Status (ECOG) 1    Skin negative    Breast negative    Ophthalmologic negative    ENMT negative    Respiratory and Thorax see HPI    Cardiovascular see HPI    Gastrointestinal  see HPI    Genitourinary negative    Musculoskeletal negative    Neurological negative    Psychiatric negative    Hematology/Lymphatics negative    Endocrine negative    Allergic/Immunologic negative    Review of Systems   as per nurse's notesPatient denies any weight loss, fatigue, weakness, fever, chills or night sweats. Patient denies any loss of vision, blurred vision. Patient denies any ringing  of the ears or hearing loss. No irregular heartbeat. Patient denies heart murmur or history of fainting. Patient denies any chest pain or pain radiating to her upper extremities. Patient denies any shortness of breath, difficulty breathing at night, cough or hemoptysis. Patient denies any swelling in the lower legs. Patient denies any nausea vomiting, vomiting of blood, or coffee ground material in the vomitus. Patient denies any stomach pain. Patient states has had normal bowel movements no significant constipation or diarrhea. Patient denies any dysuria, hematuria or significant nocturia. Patient denies any problems walking, swelling in the joints or loss of balance. Patient denies any skin changes, loss of hair or loss of weight. Patient denies any excessive worrying or anxiety or significant depression. Patient denies any problems with insomnia. Patient denies excessive thirst, polyuria, polydipsia. Patient denies any swollen glands, patient denies easy bruising or easy bleeding. Patient denies any recent infections, allergies or URI. Patient "s visual fields have not changed significantly in recent time.  Physical Exam:  General/Skin/HEENT:   General normal    Skin normal    Eyes normal    ENMT normal    Head and Neck normal    Additional PE well-developed well-nourished female in NAD. She has a chest tube placed. Lungs show decreased breath sounds in left side otherwise poor examination is within normal limits. Cardiac examination shows regular rate and rhythm. No cervical  supraclavicular or axillary adenopathy is detected.   Breasts/Resp/CV/GI/GU:   Respiratory and Thorax normal    Cardiovascular normal    Gastrointestinal normal    Genitourinary normal   MS/Neuro/Psych/Lymph:   Musculoskeletal normal    Neurological normal    Lymphatics normal   Other Results:  Radiology Results: CT:    08-Dec-13 10:59, CT Chest With Contrast   CT Chest With Contrast    REASON FOR EXAM:    lung mass  COMMENTS:       PROCEDURE: CT  - CT CHEST WITH CONTRAST  - Nov 11 2012 10:59AM     RESULT:  Comparison: CT of the chest 06/15/2012    Technique: Multiple axial images of the chest were obtained with 75 mL   Isovue 370 intravenous contrast.    Findings:  No mediastinal, right hilar, or axillary lymphadenopathy. There is a   spiculated left suprahilar mass which has increased in size from prior.   It measures 2.5 x 2.1 cm in greatest axial dimension. This causes   narrowing of the right upper lobe pulmonary artery.  The mass abuts the     mediastinum and may extend into the AP window. Calcifications are seen in   the coronaries. Mild nodularity of the right adrenal gland is similar to   prior.    There is mild to moderate centrilobular emphysema. Mild linear opacities   in the right middle lobe, right lower lobe, and left lower lobe are   likely secondary to atelectasis. 3 mm nodule in the posterior right lower   lobe is similar to prior. 2 mm nodule in the leftupper lobe is similar   to prior. There is a 5 mm nodule in the left lower lobe. This may have   been present on prior, though comparison is difficult secondary to the   atelectasis and consolidation on the prior study. Minimal dependent   linear density in the left main bronchus likely represents retained   mucous.    No aggressive lytic or sclerotic osseous lesions are identified.  IMPRESSION:    1. Spiculated left suprahilar mass is increased in size from prior. It is   concerning for  malignancy.  2. Indeterminate subcentimeter bilateral pulmonary nodules at the above.      Addendum 1135 hours 11/11/2012.:  There is a small nodular soft tissue density in the left upper quadrant   just posterior to the spleen which is incompletely visualizedat the   inferior edge of the scan. It measures 1.6 x 0.7 cm. This is nonspecific.   There is an indeterminate subcentimeter hypoechoic round focus in the   tail of the pancreas, which is not appreciated on the prior abdominal   CTs, image 97. This is also nonspecific.    Dictation Site: 8    Verified By: Gregor Hams, M.D., MD   Assessment and Plan:  Impression:   clinical stage IIIB squamous cell carcinoma of left lung in 22 old female with significant COPD  Plan:   this time I have recommended radiation therapy to her chest. Depending whether she accepts chemotherapy we'll treat up to 6000 cGy versus 7000 cGy if chemotherapy is not used. Would like to use IMRT radiation therapy to spare normal lung as much as possible based on her significant history of COPD and nasal oxygen dependency. Risks and benefits of treatment including possible deterioration Palmiter status, alteration of her blood counts, possible dysphagia secondary radiation esophagitis, fatigue or were explained in detail to the patient. She would like to wait for after the holidays and I set her up for early January for CT simulation. I discussed the case personally with Dr. Ma Hillock who will be coordinating concurrent chemotherapy should the patient decide on that treatment option.  I would like to take this opportunity to thank you for allowing me to continue to participate in this patient's care.  Electronic Signatures: Verla Bryngelson, Roda Shutters (MD)  (Signed 18-Dec-13 15:14)  Authored: HPI, Diagnosis, Past Hx, PFSH, Allergies, Home Meds, ROS, Physical Exam, Other Results, Encounter Assessment and Plan   Last Updated: 18-Dec-13 15:14 by Armstead Peaks (MD)

## 2015-03-24 NOTE — H&P (Signed)
PATIENT NAME:  Bethany Lambert, Bethany Lambert MR#:  [id number] DATE OF BIRTH:  June 01, 1934  DATE OF ADMISSION:  11/07/2012  PRIMARY CARE PHYSICIAN: Dr. Adrian Prows from [place]: Dr. Loletta Specter   CHIEF COMPLAINT: Shortness of breath and fever.   HISTORY OF PRESENT ILLNESS: Ms. Christee Mervine is a very nice 81 year old female who has history of chronic obstructive pulmonary disease. She has also hypertension and significant gastroesophageal reflux disease. She was previously admitted here in 2012 with history of left lower lobe pneumonia. The patient apparently has not been feeling well since Thanksgiving, just having increased shortness of breath with exertion. Patient states that it has been progressive up till this morning whenever she needed to sleep on the recliner when she woke up, got up and had significant worsening of her breathing. She had significant cough and felt warm all day. Her cough is productive, has a lot of sputum but she has not been be able to spit it up. She feels like everything is sitting up on her vocal cords. Patient states that during the day since Thanksgiving till now she was not able to get four or five steps without getting short of breath and she needed to stop intermittently every time that she walked. Denies any pain with respiration. Denies any chest pain. Patient states that she was getting really weak and really tired from her rapid breathing. Patient was evaluated here at the ER where she was found out to be really tachycardic, hypoxic and febrile. She was not wheezing. She only had rales and crepitus on her left side.    REVIEW OF SYSTEMS: CONSTITUTIONAL: Fever positive, fatigue positive. Negative weight loss or weight gain. EYES: Denies any changes in her vision. No blurry vision or inflammation of her eyes. ENT: Denies any tinnitus. Denies any postnasal drip or difficulty swallowing. RESPIRATORY: Positive cough. Positive dyspnea. Positive  increased respiratory rate. No painful respirations. Positive chronic obstructive pulmonary disease. Positive pneumonia last year. No hemoptysis. Negative chest pain. Negative for arrhythmia, palpitations, or syncopal episodes. GASTROINTESTINAL: No nausea, vomiting, or diarrhea. Patient has significant gastroesophageal reflux disease and she has been told that the gastroesophageal reflux disease is affecting her breathing especially in the mornings. GENITOURINARY: Negative dysuria, hematuria or increased frequency. GYN: No breast problems. Patient had a hysterectomy. ENDOCRINE: No polyuria, polydipsia, polyphagia. No cold or heat intolerance. HEME/LYMPH: No anemia, easy bruising or bleeding or swollen glands. SKIN: Without any significant rashes or other skin condition. She used to use triamcinolone for a rash that is gone. MUSCULOSKELETAL: No significant swelling on joints. No gout. NEUROLOGIC: No numbness, tingling. No CVA. No transient ischemic attack. PSYCHIATRIC: Negative for insomnia, depression or anxiety.   PAST MEDICAL HISTORY:  1. Hypertension.  2. Chronic obstructive pulmonary disease.  3. Gastroesophageal reflux disease.   ALLERGIES: Patient allergic to ciprofloxacin give her some low blood pressure.   PAST SURGICAL HISTORY:  1. Partial colon resection due to obstruction.  2. Hysterectomy.  3. Cataract surgery.  4. Hernioplasty x3.   FAMILY HISTORY: Positive for coronary artery disease and CABG in her father. Negative cancer. Negative diabetes.   SOCIAL HISTORY: Positive for 40 years of 2 packs a day tobacco smoking, quit 12 years ago. No alcohol. Patient did not do any IV drugs. She sold her house to her son and now she lives with him. She is a widow.   CURRENT MEDICATIONS:  1. Zyrtec 10 mg daily. 2. Diltiazem CD 120 mg daily. 3. Sodium docusate 100  mg at bedtime.  4. Lisinopril 20 mg once a day. 5. Metoprolol 50 mg twice a day.  6. Multivitamins once daily.  7. Omeprazole 40  mg delayed release.   8. Potassium chloride 10 mEq once daily.  9. Spiriva 18 mcg inhaled once daily.  10. Symbicort 80 mg/4.5, 2 puffs every 12 hours. 11. Triamcinolone cream  as needed.  12. Ventolin. 13. Vitamin B12.   PHYSICAL EXAMINATION:  VITAL SIGNS: Temperature 101.6, blood pressure 221/94, currently 142/49, pulse 145, currently 112, respirations in between 28 and 36, oxygen saturation 88% on room air, currently 96 on 2 to 4 liters of oxygen.   GENERAL: Patient is alert, oriented x3. She has moderate respiratory distress with use of accessory muscles. She is sitting down on the stretcher, looks acutely ill.   HEENT: Her pupils are equal and reactive. Extraocular movements are intact. Mucosa are dry. Her conjunctivae are pink. No oral lesions. No oropharyngeal exudates. Anicteric sclerae.   NECK: Supple. No JVD. No thyromegaly. No adenopathy. No carotid bruits.   CARDIOVASCULAR: Regular rate and rhythm, tachycardic. No murmurs, rubs, or gallops are appreciated. No displacement of PMI.   LUNGS: There is no wheezing at this moment. There are significant crepitations of the left lower lung base. There is actually some use of accessory muscles and the air entrance of both lungs seems slightly diminished. Patient breathing really shallow. No dullness to percussion.   ABDOMEN: Soft, nontender, nondistended. No hepatosplenomegaly. No masses. Bowel sounds are positive.   EXTREMITIES: No edema, no cyanosis, no clubbing. Pulses +2. Capillary refill less than 3.   SKIN: Without any significant rashes or petechiae. No cyanosis.   NEUROLOGIC: Cranial nerves II through XII intact. Strength 5/5 in four extremities.   PSYCHIATRIC: Negative agitation. Patient is pleasant. Alert and oriented x3.   LYMPHATICS: Negative for lymphadenopathy in neck or supraclavicular areas.   MUSCULOSKELETAL: No significant joint deformity.   LABORATORY, DIAGNOSTIC AND RADIOLOGICAL DATA: Glucose 158, creatinine  0.77, magnesium 1.3, BNP 171, total protein 7.8, albumin 2.6. LFTs within normal limits. Troponins are negative. White blood cells 1700, hemoglobin 11.9, platelets 250. Urinalysis within normal limits, only 1+ leukocyte esterase and 5 white blood cells. Chest x-ray: Left lower lung infiltrate and hyperinflation.   ASSESSMENT AND PLAN: 81 year old female with history of chronic obstructive pulmonary disease, hypertension and gastroesophageal reflux disease comes with tachycardia, shortness of breath, tachypnea, fever, acute respiratory distress and SIRS, admitted for pneumonia.  1. Acute respiratory distress. Patient is not on oxygen at home. She was hypoxic in the upper 80s requiring oxygen now. She is saturating better. She had significant respiratory distress with use of accessory muscles and this is likely due to her pneumonia.  2. SIRS. Patient meets criteria for SIRS with tachycardia, tachypnea, elevation of temperature and elevation of her white blood count and this is also likely due to her pneumonia. At this moment her blood pressure is stable. No signs of septic shock.  3. Pneumonia. Patient has left lower lung pneumonia. She is started on Rocephin and azithromycin after cultures were taken. We are going to add on sputum cultures. Add on Legionella and strep antigens. Since the patient looks in such respiratory distress we are going to do nebs every four hours, pulmonary toilet, flutter valve and will consider to do some CPT if she is not getting better. I am not going to use steroids despite the fact that patient has chronic obstructive pulmonary disease, she is not wheezing.  4. Malignant hypertension.  Patient has systolics above 848. At this moment they have improvement. Continue current medications that she is taking at home. Add on labetalol p.r.n. to help with the tachycardia.  5. Mild dehydration and tachycardia. Likely the tachycardia is results of the SIRS and dehydration. We are going to  give her 1 liter of fluid at 100 mL/h and then decrease it to half of that. Monitor her fluid status. Her BNP is negative.  6. Gastroesophageal reflux disease. Continue treatment with PPI.  7. Chronic obstructive pulmonary disease. At this moment we are going to do nebulizers. Continue hours inhaled steroids. I am not going to add systemic steroids for her.  8. Elevated white blood count. Follow up labs in the morning.  9. Elevated glucose likely due to stress. Patient does not have history of diabetes. Continue to observe.  10. Patient is a FULL CODE.   CRITICAL CARE TIME: I spent 45 minutes with this admission. She is critically ill with SIRS, acute respiratory distress and potential to worsening her condition is actually poor.   ____________________________ River[place] Sink, MD rsg:cms D: 11/07/2012 20:27:08 ET T: 11/08/2012 05:36:57 ET  JOB#: [id number] cc: Avery Sink, MD, <Dictator> Adlean Hardeman America Brown MD ELECTRONICALLY SIGNED 11/09/2012 13:46

## 2015-03-26 DIAGNOSIS — J431 Panlobular emphysema: Secondary | ICD-10-CM | POA: Diagnosis not present

## 2015-03-26 DIAGNOSIS — J449 Chronic obstructive pulmonary disease, unspecified: Secondary | ICD-10-CM | POA: Diagnosis not present

## 2015-03-26 DIAGNOSIS — H6121 Impacted cerumen, right ear: Secondary | ICD-10-CM | POA: Diagnosis not present

## 2015-03-26 DIAGNOSIS — R42 Dizziness and giddiness: Secondary | ICD-10-CM | POA: Diagnosis not present

## 2015-03-27 NOTE — Discharge Summary (Signed)
PATIENT NAME:  Bethany Lambert, Bethany Lambert MR#:  [id number] DATE OF BIRTH:  01-30-1934  DATE OF ADMISSION:  11/07/2012 DATE OF DISCHARGE:  11/15/2012  DISCHARGE DIAGNOSES:  1. Suprahilar left lung mass seen on PET scan.  2. Chronic obstructive pulmonary disease exacerbation.  3. Pneumonia.  4. Hypertension.   DISCHARGE MEDICATIONS:  1. Lisinopril 20 mg p.o. daily.  2. Diltiazem 120 CD 1 capsule p.o. daily.  3. Metoprolol 50 mg p.o. b.i.d.  4. Omeprazole 40 mg p.o. daily.  5. Potassium chloride 10 mEq p.o. daily.  6. Multivitamin 1 tab p.o. daily.  7. Spiriva 18 mcg 1 inhalation daily.  8. Symbicort 80/4.5, two puffs inhaled every 12 hours.  9. Ventolin 90 mcg inhaler two puffs every 6 hours p.r.n. for wheezing.  10. Cetirizine 10 mg p.o. daily.  11. Docusate 100 mg p.o. daily.  12. Prednisone 10 mg taper.  13. Acetaminophen 325 mg 2 tabs every four hours p.r.n. for pain.  14. Doxazosin 4 mg p.o. every four hours.  15. Cefuroxime 250 mg p.o. b.i.d. x5 more days.  MEDICATIONS TO STOP: 1. Triamcinolone cream.  2. Hydralazine.   CONSULTS:  1. Dr. Genevive Bi. 2. Dr. Humphrey Rolls, pulmonology.   PROCEDURES: The patient underwent a CT scan that showed a left lung mass. The patient underwent a PET scan that was positive for a left suprahilar mass consistent with lung cancer.   PERTINENT LABS ON DAY OF DISCHARGE: Sodium 139, potassium 3.9, creatinine 1.04, hemoglobin 11.4, white blood cell count 10.2 and platelets 239.   BRIEF HOSPITAL COURSE:  1. Acute respiratory failure. The patient initially came in with acute respiratory failure consistent with chronic obstructive pulmonary disease exacerbation with underlying pneumonia. A CT scan was positive for a lung mass. Further evaluation with a PET scan revealed that it was more consistent with a left suprahilar mass consistent with lung cancer. Bronchoscopy was attempted, but was unable to be completed. Plan is to undergo a percutaneous biopsy as an outpatient.  She has been followed by Dr. Humphrey Rolls and Dr. Genevive Bi who will coordinate this as an outpatient.  2. Underlying pneumonia. The patient is currently being treated for pneumonia and chronic obstructive pulmonary disease exacerbation and she will complete the prednisone taper and also complete the 14-day course of cefuroxime as prescribed. She was treated with seven days of azithromycin as well.  3. Hypertension. Remain on her blood pressure medications as prescribed.   DISPOSITION: She is in stable condition to be discharged home with her home O2.   FOLLOWUP:  1. Follow-up with Dr. Ola Spurr. 2. Follow-up with Dr. Humphrey Rolls and Dr. Genevive Bi. Dr. Genevive Bi and Dr. Humphrey Rolls to set up the percutaneous biopsy as an outpatient.   TOTAL TIME OF DISCHARGE: Greater than 30 minutes with looking over previous files and notes.   ____________________________ Dion Body, MD kl:ap  D: 11/15/2012 17:06:36 ET            T: 11/16/2012 11:49:29 ET JOB#: [id number] cc: Dion Body, MD, <Dictator> Allyne Gee, MD Lew Dawes. Genevive Bi, MD Cheral Marker. Ola Spurr, MD Dion Body MD ELECTRONICALLY SIGNED 12/11/2012 17:13

## 2015-03-27 NOTE — Discharge Summary (Signed)
PATIENT NAME:  Bethany Lambert, HOSTLER MR#:  [id number] DATE OF BIRTH:  1934-10-27  DATE OF ADMISSION:  02/21/2013 DATE OF DISCHARGE:  02/22/2013  DISCHARGE DIAGNOSES: 1.  Acute chronic obstructive pulmonary disease exacerbation.  2.  Lung cancer.   HISTORY OF PRESENT ILLNESS: Please see admission history and physical. Briefly, this is a 81 year old woman undergoing chemoradiation for lung cancer. She was recently discharged after a COPD exacerbation. She came in with increased cough, sputum production, difficulty breathing and difficulty talking due to shortness or breath. She had had some improvement with nebulizer in the ER but was admitted for further treatment.   HOSPITAL COURSE BY ISSUE:  1.  COPD exacerbation. The patient was treated with nebulizers and steroids. She stabilized over 24 hours and was discharged home with outpatient followup.  Nebs were arranged at home.   DISCHARGE MEDICATIONS: Please see [place] discharge reconciliation meds.   DISCHARGE FOLLOWUP:  The patient will follow up with Dr. Ola Spurr in 1 to 2 weeks' time. The patient will have home health services with physical therapy and a nurse.   DISCHARGE DIET: Low sodium.   DISCHARGE ACTIVITY: As tolerated.    ____________________________ Cheral Marker. Ola Spurr, MD dpf:cs D: 02/27/2013 14:49:18 ET T: 02/27/2013 15:07:28 ET JOB#: [id number]  cc: Cheral Marker. Ola Spurr, MD, <Dictator>

## 2015-03-27 NOTE — Discharge Summary (Signed)
PATIENT NAME:  Bethany Lambert, Bethany Lambert MR#:  [id number] DATE OF BIRTH:  08-03-1934  DATE OF ADMISSION:  11/19/2012 DATE OF DISCHARGE:  11/30/2012  ADMITTING DIAGNOSIS: Left lung mass.   DISCHARGE DIAGNOSIS:  1.  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  2.  Iatrogenic pneumothorax following lung biopsy with persistent air leak.   HOSPITAL COURSE: This patient is a 81 year old woman with a long-standing history of chronic obstructive pulmonary disease. She was recently found to have an enlarging left lung mass and she was brought into the hospital on December 16th where she underwent a CT-guided needle biopsy. That was complicated by pneumothorax which required insertion of a small pigtail catheter. Over the course of the next 10 days she had intermittent air leak with associated pneumothorax. Eventually, the tube was repositioned and modified in several different points. Eventually, her air leak resolved and the chest tube was removed. At the time of her discharge, the final pathology on the lung biopsy returned squamous cell carcinoma. She was deemed to be a nonsurgical candidate and she was, therefore, arranged for follow-up be seen by Dr. Baruch Gouty and Dr. Ma Hillock.  She was given discharge instructions and told to return if she develops any shortness of breath.   DISCHARGE MEDICATIONS:  At the time of her discharge her medications included prednisone 30 mg a day, Spiriva once a day, Symbicort every 12 hours 2 puffs, Ventolin 2 puffs inhaled every 6 hours, omeprazole 40 mg in the evening, multivitamin, metoprolol 50 mg twice a day, lisinopril 20 mg once a day, diltiazem CD 120 mg once a day and Cetirizine 10 mg once a day.   DISCHARGE INSTRUCTIONS:  She was instructed to return to see Dr. Ma Hillock and Dr. Baruch Gouty in the [facility] after discharge. In addition, she is to follow up with Dr. Devona Konig for management of her underlying COPD.    ____________________________ Lew Dawes. Genevive Bi,  MD teo:ct D: 12/19/2012 08:04:12 ET T: 12/19/2012 09:50:46 ET JOB#: [id number]  cc: Christia Reading E. Genevive Bi, MD, <Dictator> Louis Matte MD ELECTRONICALLY SIGNED 12/25/2012 9:51

## 2015-03-27 NOTE — Consult Note (Signed)
PATIENT NAME:  Bethany Lambert, Bethany Lambert MR#:  [id number] DATE OF BIRTH:  1934/07/12  DATE OF CONSULTATION:  02/04/2013  REFERRING PHYSICIAN:  Cannelton Sink, MD CONSULTING PHYSICIAN:  Gared Gillie R. Ma Hillock, MD  REASON FOR CONSULTATION:  Patient with lung cancer; please follow.   HISTORY OF PRESENT ILLNESS:  The patient is a 81 year old female with known history of nonsmall cell left lung cancer likely stage IIIA who was started on radiation along with concurrent chemotherapy with weekly Taxol and carboplatin on December 19, 2012. The patient also has COPD. The patient has had issues with intermittent dizziness requiring IV fluids as outpatient with improvement in symptoms each time. She has now been admitted to the hospital on March 2nd with complaints of progressive shortness of breath, also has some recurrent dizziness on getting up and ambulating. States that she is trying to eat and drink well. She is feeling weak and this time. Hemoglobin is 7.5 today. Denies any bleeding symptoms. Denies any chest pain or other pain issues. Chest x-ray was negative for any new infiltrates. She is on treatment for exacerbation of COPD and states that symptoms are slightly better overall.   PAST MEDICAL HISTORY AND SURGICAL HISTORY:  1.  Oxygen-dependent COPD.  2.  Nonsmall cell left lung cancer, started treatment on December 19, 2012 as described above.  3.  GERD.  4.  Hysterectomy.  5.  Hypertension.  6.  Colon resection.  7.  Cataract surgery.  8.  Hernia repair.  9.  Secondary erythrocytosis.   FAMILY HISTORY:  Remarkable for heart disease.   SOCIAL HISTORY:  Ex-smoker, 40-pack-year history in the past, quit 12 years ago. Denies alcohol usage.   ALLERGIES:  CIPRO AND NORVASC.   HOME MEDICATIONS:  Include Ventolin inhaler 2 puffs q. 6 hours p.r.n., Symbicort inhaler 2 puffs q. 12 hours, Spiriva 18 mcg HandiHaler inhaler once daily, potassium chloride 1 tablet daily, Prilosec 40 mg once daily,  multivitamin 1 tablet daily, metoprolol tartrate 50 mg b.i.d., Cardizem CD 120 mg p.o. ER once daily, lisinopril 20 mg once daily, docusate 100 mg b.i.d., cetirizine 10 mg daily.   REVIEW OF SYSTEMS:  CONSTITUTIONAL: As in HPI. Currently, no fever or chills.  HEENT: No headaches, epistaxis, ear or jaw pain. Nose feels dry due to constant nasal cannula oxygen therapy.  CARDIAC: Currently denies any chest pain at rest, palpitations, orthopnea or PND.  LUNGS: As in HPI. Has mild wheezing which is slowly improving. Currently denies hemoptysis. No progressive chest pain.  GASTROINTESTINAL: No nausea, vomiting or diarrhea. No blood in stools or melena.  GENITOURINARY: No dysuria or hematuria.  MUSCULOSKELETAL: Has chronic arthritis, no new bone pains.  SKIN: No new rashes or pruritus.  NEUROLOGIC: No new focal weakness, seizures or loss of consciousness.  ENDOCRINE: No polyuria or polydipsia. Appetite is fair.   PHYSICAL EXAMINATION: GENERAL: The patient is weak and tired looking, sitting in bed, otherwise alert and oriented x 4 and converses appropriately. No acute distress on nasal cannula oxygen, able to speak sentences with minimal tachypnea. No icterus.  VITAL SIGNS: Temperature 97.4, pulse 82, respiratory rate 21, blood pressure 177/64, 96% on 2 liters.  HEENT: Normocephalic, atraumatic. Extraocular movements intact. Sclerae anicteric. Mouth is dry. No thrush.  NECK: Negative for lymphadenopathy.  CARDIOVASCULAR: S1, S2, regular rate and rhythm.  LUNGS: Show markedly diminished breath sounds overall, few rhonchi. No crepitations.  ABDOMEN: Soft, nontender. Bowel sounds present.  EXTREMITIES: No major edema or cyanosis.  SKIN: No generalized rashes or  major bruising.  NEUROLOGIC: Limited examination. Cranial nerves seem intact. Moves all extremities spontaneously.  MUSCULOSKELETAL: No obvious joint deformity or swelling.   DIAGNOSTIC DATA:  WBC 2300, ANC 2100, hemoglobin 7.4, MCV 93,  platelets 143. Troponin I is less than 0.02. Creatinine is 0.92, potassium 4.3, calcium 7.6. Liver functions were unremarkable except albumin slightly low at 3.3.   IMPRESSION AND RECOMMENDATIONS:  A 81 year old female patient with known history of nonsmall cell left lung cancer, started concurrent radiation and chemotherapy with weekly Taxol and carboplatin on December 19, 2012, most recent dose of Taxol and Carboplatin was on February 26th, currently admitted with chronic obstructive pulmonary disease exacerbation, progressive weakness and intermittent dizziness. Agree with ongoing supportive treatment including bronchodilators, intravenous hydration, steroids and oxygen. Given progressive anemia which is likely secondary to myelosuppression from recent chemoradiation and her severe symptoms of fatigue and dyspnea, I have recommended that we pursue 1 unit of packed red blood cell transfusion and explained risks and benefits of transfusion. The patient is agreeable to this and signed consent form. We will order 1 unit packed red blood cell transfusion today with premedication with Tylenol and Benadryl. Continue to monitor blood counts while in hospital and transfuse as indicated. I have also discussed with the patient that given her borderline poor performance status and recurrent episodes of sickness and dizziness, we may need to stop chemotherapy at this time. This is her week off of chemotherapy and will therefore followup at the [facility] otherwise as already scheduled and then rediscuss this issue based upon how she is doing at that time. No acute pain issues at this time. We will continue to follow as indicated during hospitalization. The patient was explained above, agreeable to this plan.   Thank you for the referral. Please feel free to contact me if additional questions.    ____________________________ Rhett Bannister Ma Hillock, MD srp:si D: 02/04/2013 16:24:13 ET T: 02/04/2013 17:24:04  ET JOB#: [id number]  cc: Fitzpatrick Alberico R. Ma Hillock, MD, <Dictator> Alveta Heimlich MD ELECTRONICALLY SIGNED 02/05/2013 10:43

## 2015-03-27 NOTE — H&P (Signed)
PATIENT NAME:  Bethany Lambert, CASASOLA MR#:  [id number] DATE OF BIRTH:  May 01, 1934  DATE OF ADMISSION:  02/03/2013  PRIMARY CARE PHYSICIAN: Adrian Prows, MD  REFERRING PHYSICIAN: Ferman Hamming, MD  CHIEF COMPLAINT: Increased shortness of breath.   HISTORY OF PRESENT ILLNESS: Ms. Bethany Lambert is a very nice 81 year old female who has history of recently diagnosed non-small squamous cell carcinoma of the lung. The patient was previously admitted on 11/07/2012 and discharged on 11/15/2012. At that moment, she was noticed to have a suprahilar left lung mass, seen on a PET scan, for what she was previously treated for pneumonia and systemic inflammatory response syndrome. She improved and then she was referred to Dr. Nestor Lewandowsky.  Dr. Nestor Lewandowsky had her admitted on 11/19/2012 and discharged again on 26/71/2458, after a complicated course. At that moment, the patient had a CT biopsy of the lung which had the diagnosis of non-small cell carcinoma of the lung and she had an iatrogenic pneumothorax following biopsy with a persistent air leak. The patient had an enlarging lung mass which currently is being treated with radiation therapy, that she finished  within the last week, and chemotherapy with Taxol and cisplatin. During the previous hospitalization, the patient had a pneumothorax that required insertion of a small pigtail catheter. She spent 10 days with intermittent air leak associated with pneumothorax which eventually resolved. The patient was referred to Dr. Ma Hillock who has been following her case. Today she comes with a history of increased shortness of breath, increased cough, increased secretions. The patient denies any fever, denies any symptoms similar of previous pneumonias. She is just getting more short of breath and she is scared of being home and having some problems since her course has been so complicated so far. The patient is on 2 liters of oxygen chronically due to chronic respiratory failure  due to her COPD and now her cancer and right now she is requiring the same amount of oxygen. The patient was evaluated by Dr. Renard Hamper, but at this moment he asked [place] to put her in the hospital for admission. Her chest x-ray shows left suprahilar spiculated mass, which was previously seen. No signs of new pneumothorax. No signs of new infiltrates. The patient most likely has exacerbation of her COPD.   REVIEW OF SYSTEMS:  CONSTITUTIONAL: Denies any fever. Denies any significant fatigue or weakness. She only gets short of breath whenever she walks, but within the last couple of days she has been short of breath on resting.  EYES: No blurry vision. No double vision. No inflammation.  ENT: No tinnitus. No hearing loss. No postnasal drip. No sinus pain. No difficulty swallowing.  RESPIRATORY: Positive cough. No wheezing. No hemoptysis. Positive dyspnea on exertion, on a chronic basis, but within the last couple of days has been at resting. No painful respirations.  CARDIOVASCULAR: The patient states that she had chest pain today that happened for a little while, mostly behind tightness. She says no pain but tightness, which could be due to her COPD, but she had an event in the morning where her left upper extremity was sore for a couple hours and the pain was radiating down to her neck. The patient states that she might have slept bad, but she is not quite sure if that was the case. She has not had any significant sharp chest pain. No syncopal episodes. No palpitations. The patient has chronic tachycardia.  GASTROINTESTINAL: No nausea, vomiting or diarrhea. No abdominal pain. The patient states that she  has gas and she has significant GERD for what she takes omeprazole. No rectal bleeding. No melena.  GENITOURINARY: No changes in frequency or incontinence. No significant dysuria or hematuria.  ENDOCRINE: No polyuria, polydipsia or polyphagia. No cold or heat intolerance. HEMATOLOGIC/LYMPHATIC: No anemia, easy  bruising or bleeding.  MUSCULOSKELETAL: No significant neck pain, back pain or gout.  NEUROLOGIC: No numbness, tingling. No CVA. No TIA.  PSYCHIATRIC: Mild anxiety. Negative significant depression.   PAST MEDICAL HISTORY: 1.  COPD.  2.  Chronic respiratory failure, on 2 liters of oxygen at home.  3.  Non-small cell left lung cancer, squamous cell carcinoma, which is stage IIIa, T4 invading the mediastinum.   4.  GERD.  5.  History of colon resection.  6.  Hypertension.  7.  History of cataracts.  8.  Erythrocytosis.   ALLERGIES:  THE PATIENT IS ALLERGIC TO CIPROFLOXACIN.   PAST SURGICAL HISTORY:  1.  Biopsy of the lung, recently done, requiring a pigtail catheter due to pneumothorax.  2.  Hernioplasty x 3.  3.  Cataract surgery.  4.  Hysterectomy.  5.  Partial colon resection due to obstruction in the past.   FAMILY HISTORY: Positive for coronary artery disease and CABG in her father. Negative for cancer. Negative for diabetes.   SOCIAL HISTORY: The patient is a former smoker, 40 years. Was smoking 2 packs a day, quitting 12 years ago. No alcohol or drug abuse. She lives with her son and she is a widow.   CURRENT MEDICATIONS: 1.  Docusate 100 mg twice daily.  2.  Spiriva 18 mcg once daily. 3.  Lisinopril 20 mg once daily. 4.  Diltiazem 120 mg/24 mg extended-release.  5.  Metoprolol 50 mg twice daily.  6.  Omeprazole 20 mg twice a day.  7.  Multivitamins.  8.  Symbicort 2 puffs twice daily.  9.  Ventolin. 10.  Cetirizine 10 mg once daily.   PHYSICAL EXAMINATION: VITAL SIGNS: Blood pressure 167/70, pulse 108 to 116, temperature 98.8, respirations 22 to 24, oxygen saturation 100% on 2 liters nasal cannula.  GENERAL: The patient is alert and oriented x 3, in no acute distress. No respiratory distress. Hemodynamically stable.  HEENT: Pupils are equal and reactive. Extraocular movements are intact. Anicteric sclerae. Pink conjunctivae. Mucosa is slightly dry. No oral lesions. No  oropharyngeal exudates.  NECK: Supple. No JVD. No thyromegaly. No adenopathy. No carotid bruits. No rigidity.  CARDIOVASCULAR: Tachycardic. Regular rate and rhythm for the most part. No displacement of PMI.  LUNGS: Decreased respiratory sounds all throughout the lungs without any significant wheezing at this moment, but she has very decreased air entrance. There is no use of accessory muscles. No dullness to percussion. No signs of consolidation.  ABDOMEN: Soft, nontender, nondistended. No hepatosplenomegaly. No masses. Bowel sounds are positive.  EXTREMITIES: No edema. No cyanosis. No clubbing. Pulses +2. Capillary refill less than 3.  MUSCULOSKELETAL: No significant joint effusions or edema.  NEUROLOGIC: Cranial nerves II through XII intact.  PSYCHIATRIC: Mood is normal without any signs of significant agitation or depression.  LYMPHATICS: Negative for lymphadenopathy in neck or supraclavicular areas.   LABS AND IMAGING STUDIES: EKG: Sinus tachycardia. No ST depression or elevation. The patient has occasional PACs on the monitor, but no PVCs. No signs of atrial fibrillation. The patient keeps P waves on the monitor.   Glucose is 100, creatinine 0.76, sodium 135 and albumin is 3.3. Troponin is 0.02. White count is 3.5, hemoglobin is 8, hematocrit 23 and  platelets 148. D-dimer was slightly elevated before, 0.82. Red blood cells 1.  White blood cells 3.   ASSESSMENT AND PLAN: A 81 year old female with history of non-small cell carcinoma of the lung/squamous cell carcinoma. The patient recently started on chemotherapy including Taxol and carboplatin weekly. She finished radiation therapy. She also has chronic obstructive pulmonary disease, chronic respiratory failure, hypertension and she has chronic tachycardia.  1.  Chronic obstructive pulmonary disease exacerbation. The patient comes here with bronchitis exacerbation. At this moment, I do not see any significant infectious source. The patient does  not have an elevation of white count, but she is pancytopenic due to her chemotherapy. We have to watch out for that. The patient does not have any new exudates and she is afebrile. For that I am not going to start any antibiotics at this moment, but I am going to put her on high dose of steroids IV. The patient is going to have respiratory treatments. At this moment, I am going to hold on albuterol and try Xopenex since the patient is tachycardic. If there is any fever or significant changes in condition, we are going to start her antibiotics, but I am going to hold off on that right now. Pulmonary toilet. Follow up labs in the morning.  2.  Tachycardia. The patient has significant tachycardia. She takes diltiazem and metoprolol and heart rate usually is elevated on every admission. I do not see any thyroid stimulating hormone testing for what I am going to order one right now. The patient looks dry for what I am going to give her 1 liter of fluids over the next 10 hours to see if that helps the tachycardia. For now we are going to monitor on telemetry off unit.  3.  Lung cancer, squamous cell carcinoma. The patient is status post radiotherapy, now on chemotherapy with cisplatin and Taxol. Followed by Dr. Ma Hillock. We are going to put in a consultation for Dr. Ma Hillock to follow up on the patient.  4.  Pancytopenia. The patient has drop of her hemoglobin down to 8.  We will continue to monitor. No signs of bleeding. Her white count is 3.5 and her platelets are 148. This is likely due to the chemotherapy. I am not going to put her on neutropenic precautions as her white count is still around 3.5.  5.  Chronic obstructive pulmonary disease. Continue her home inhalers. Now on high dose of steroids.  6.  Deep vein thrombosis prophylaxis, on heparin. The patient is very tachycardic and short of breath. At this moment my suspicion of pulmonary embolus is very low. If her shortness of breath is not improving, or if she  is having chest pain, consider adding on a CT angio.  7.  The patient is a FULL CODE.  8.  GI prophylaxis with omeprazole twice daily. 9.  Transfer care to Dr. Ola Spurr in the morning.   TIME SPENT: I spent 50 minutes with this admission. ____________________________ Brigantine Sink, MD rsg:sb D: 02/03/2013 22:49:53 ET T: 02/04/2013 07:06:13 ET JOB#: [id number]  cc: [place] Sink, MD, <Dictator> ROBERTO America Brown MD ELECTRONICALLY SIGNED 02/19/2013 13:01

## 2015-03-27 NOTE — H&P (Signed)
PATIENT NAME:  Bethany Lambert, Bethany Lambert MR#:  [id number] DATE OF BIRTH:  March 25, 1934  DATE OF ADMISSION:  11/19/2013  PRIMARY CARE PHYSICIAN: Cheral Marker. Ola Spurr, MD  REFERRING PHYSICIAN: Ahmed Prima, MD  CHIEF COMPLAINT: Shortness of breath.   HISTORY OF PRESENT ILLNESS: The patient is a 81 year old Caucasian female with past medical history of non-small cell lung cancer, stage III, invading mediastinum, chronic history of COPD, GERD, hypertension and history of cataracts, who is presenting to the ER with a chief complaint of shortness of breath for 2 days. The patient has chronic hypoxic respiratory failure and lives on 2 liters of oxygen. She is reporting that for the past 2 days, she is short of breath, but since 9:00 p.m. last night, she could not breathe, and she started breathing with pursed lips. She has been coughing whitish-yellow phlegm, but she is having difficulty to bring it out. Denies any fevers. Recently seen by Dr. Ma Hillock regarding her lung cancer, and she was told that she is under remission. She comes into the ER because of worsening of the shortness of breath. Chest x-ray has revealed no acute infiltrates. The patient has received Solu-Medrol and nebulizer treatments. Clinically, she felt better, but she was becoming short of breath and hypoxic with minimal exertion. ER physician has decided to admit the patient because of the exertional dyspnea. Hospitalist team is called to admit the patient. During my examination, the patient is resting comfortably, but reporting that with minimal ambulation, she is becoming short of breath. Denies any chest pain. No nausea, vomiting. No hemoptysis. No other complaints. No family members at bedside.   PAST MEDICAL HISTORY:  1. Chronic history of COPD, chronic respiratory failure, lives on 2 liters of oxygen at home.  2. GERD. 3. Hypertension.  4. Cataracts.  5. Erythrocytosis.  6. 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 Ma Hillock.   PAST SURGICAL HISTORY:  1. Biopsy of the lung.  2. Hernioplasty x3.  3. Cataract surgery.  4. Colon resection.  5. Hysterectomy.  ALLERGIES: THE PATIENT IS ALLERGIC TO CIPROFLOXACIN.   PSYCHOSOCIAL HISTORY: Lives at home with son. She is a former smoker, 40 years of smoking history, and quit smoking 12 years ago. Denies alcohol or illicit drug usage.   FAMILY HISTORY: Father has a history of coronary artery disease status post CABG.  HOME MEDICATIONS:  1. Synthroid 50 mcg p.o. once daily.  2. Potassium chloride 20 mEq once daily.  3. Multivitamin 1 tablet p.o. once daily. 4. Levaquin 750 mg p.o. q.24 hours. 5. Fenofibrate 200 mg once a day.  6. DuoNebs every 6 hours. 7. Colace 100 mg p.o. 2 times a day. 8. Clozapine 100 mg 2 times a day.  9. Calcium carbonate 500 mg 3 times a day.   REVIEW OF SYSTEMS:  CONSTITUTIONAL: Denies any fever or fatigue.  EYES: Denies blurry vision.  ENT: Denies epistaxis or discharge.  RESPIRATION: Complaining of cough. Denies any hemoptysis. Has chronic history of COPD. Has history of non-small cell lung cancer, stage IIIA, T4.  CARDIOVASCULAR: Denies any chest pain, palpitations or syncope.  GASTROINTESTINAL: Denies nausea, vomiting, diarrhea.  GENITOURINARY: No dysuria or hematuria. GYNECOLOGIC AND BREASTS: Denies any breast mass. Is status post hysterectomy.  ENDOCRINE: Denies polyuria, nocturia. Has hypothyroidism.  HEMATOLOGIC AND LYMPHATIC: Denies anemia, easy bleeding or bruising.  INTEGUMENTARY: No acne, rash, lesions.  MUSCULOSKELETAL: No joint pain in the neck and back. Denies any shoulder pain.  NEUROLOGIC: Denies vertigo or ataxia.  PSYCHIATRIC: No ADD, OCD.   PHYSICAL EXAMINATION:  VITAL SIGNS: Temperature 98.6, pulse 104, respirations 20, blood pressure 90/54, pulse oximetry is 97% on 2 liters.   GENERAL APPEARANCE: Not under acute distress. Moderately built and nourished. Not using any accessory muscles.  HEENT: Normocephalic, atraumatic. Pupils are equally reacting to light and accommodation. No scleral icterus. No conjunctival injection. No sinus tenderness. Moist mucous membranes. No postnasal drip. Nares are patent.  NECK: Supple. No JVD. No thyromegaly. Range of motion is intact. No carotid bruits.  LUNGS: Moderate air entry, end-expiratory wheezing. No crackles, no rales.  CARDIAC: S1, S2 normal. Regular rate and rhythm. No anterior chest wall tenderness on palpation. No peripheral edema.  GASTROINTESTINAL: Soft. Bowel sounds are positive in all 4 quadrants. Nontender, nondistended. No hepatosplenomegaly. No masses felt.  NEUROLOGIC: Awake, alert, oriented x3. Cranial nerves II through XII are grossly intact. Motor and sensory are intact. Reflexes are 2+.  EXTREMITIES: No edema. No cyanosis. No clubbing.  SKIN: Warm to touch. Normal turgor. No rashes. No lesions.  MUSCULOSKELETAL: No joint effusion, tenderness, erythema. Reflexes are 2+.  PSYCHIATRIC: Normal mood and affect.   LABORATORY AND IMAGING STUDIES: Troponin less than 0.02. WBC 12.8, hemoglobin 10.8, hematocrit 32.0, platelets are 210. Chem-8 is normal except anion gap is low at 6. Chest x-ray: Stable exam. No evidence of acute superimposed disease. COPD. A 12-lead EKG: Sinus tachycardia at 108 beats per minute, normal PR and QRS intervals. No acute ST-T wave changes   ASSESSMENT AND PLAN: A 81 year old Caucasian female presenting to the ER with a chief complaint of 2-day history of shortness of breath associated with cough for 2 days. Will be admitted with the following assessment and plan.   1. Acute exacerbation of chronic obstructive pulmonary disease with chronic respiratory failure. Will continue oxygen via nasal cannula, Solu-Medrol IV q.6 hours, DuoNebs q.4-6 hours and albuterol as needed.  Will provide her IV  Rocephin and Zithromax, AS THE PATIENT IS ALLERGIC TO CIPROFLOXACIN, as the patient is immunocompromised.  2. Chronic history of lung cancer, stage IIIA. The patient is reporting that she is under remission. Will recommend the patient to follow up with oncologist, Dr. Ma Hillock, as an outpatient.  3. Gastroesophageal reflux disease. The patient will be on gastrointestinal prophylaxis.  4. Hypertension. Blood pressure is stable. Resume her home medications.  5. Will provide gastrointestinal and deep vein thrombosis prophylaxis.   CODE STATUS: She is full code. Eldest daughter and son are medical power of attorney.   Will transfer the patient to Dr. Ola Spurr.   Diagnosis and plan of care were discussed in detail with the patient. She is aware of the plan.   TOTAL TIME SPENT ON ADMISSION: 45 minutes.   ____________________________ Nicholes Mango, MD ag:lb D: 11/19/2013 07:26:11 ET T: 11/19/2013 07:52:08 ET JOB#: [id number]  cc: Nicholes Mango, MD, <Dictator> Nicholes Mango MD ELECTRONICALLY SIGNED 12/01/2013 0:31

## 2015-03-27 NOTE — H&P (Signed)
PATIENT NAME:  Bethany Lambert, Bethany Lambert MR#:  [id number] DATE OF BIRTH:  1934/01/17  DATE OF ADMISSION:  02/21/2013  NOTE: This is a day of planned observation.  CHIEF COMPLAINT: Shortness of breath.   HISTORY OF PRESENT ILLNESS: The patient is a pleasant a 81 year old female with lung cancer and COPD. She has been more short of breath for a week. Recently discharged from the hospital about 2 weeks ago. She is coughing, has some sputum production, very hard time breathing and completing sentences. Pulse ox was normal in the ER. She did improve some with a nebulizer, which she feels is necessary to continue as she was very, very short of breath prior to that.   REVIEW OF SYSTEMS: Negative other than above.   PAST MEDICAL HISTORY:  1.  COPD.  2.  Non-small left-sided lung cancer, stage IIIA, T4, invading the mediastinum.  3.  GERD. 4.  History of colon resection. 5.  Hypertension.  6.  History of cataracts.  7.  Erythrocytosis.   ALLERGIES: CIPROFLOXACIN.   PAST SURGICAL HISTORY: Hernioplasty x 3, cataract surgery, hysterectomy, partial colon resection due to obstruction.   FAMILY HISTORY: Coronary artery disease in her father.   SOCIAL HISTORY: Former smoker of 40 years, quit 12 years ago. No alcohol or drug abuse. Lives with son. She is widowed.   MEDICATIONS: From discharge summary from March 6 include: Metoprolol 50 mg daily, potassium chloride 10 mEq daily, Spiriva once daily, Symbicort 2 puffs b.i.d., diltiazem CD 120 daily, albuterol 2 puffs q.6 hours p.r.n., sucralfate 1 gram q.i.d., omeprazole 40 mg b.i.d., prednisone taper which she should have finished at this point.   PHYSICAL EXAMINATION:  GENERAL: Pleasant, mild respiratory distress, mainly shown by difficulty completing sentences.  VITAL SIGNS: Blood pressure 123/56, respirations 24, pulse of approximately 110 to 115, temperature 98.6.  HEENT: Normocephalic, atraumatic. Oropharynx clear. Conjunctivae normal. NECK: No bruits,  thyromegaly, adenopathy, JVD.  CHEST: Diminished, left-sided wheezing.  HEART: Tachycardic, regular. Peripheral pulses decreased.  ABDOMEN: Soft, flat, nontender. No hepatosplenomegaly.  EXTREMITIES: Minimal edema.  NEUROLOGIC: Nonfocal.  SKIN: No rashes or other lesions.   LABORATORY AND RADIOLOGIC DATA: Workup included a CT scan that was negative for PE, no focal pneumonia, some hyperinflation consistent with COPD. White count was 4000, hemoglobin 9.8. Met-C showed a sodium of 131, otherwise relatively normal.   ASSESSMENT AND PLAN: Chronic obstructive pulmonary disease exacerbation: Relatively stable, though with her chronically debilitated state and tachycardia and dyspnea, will observe her overnight. Put her on intravenous steroids, some Zithromax as well as nebulizers. May have some overlying anxiety, so will try low-dose lorazepam. The family thinks that is possible as well. Will keep her on her diltiazem and metoprolol as well as her omeprazole and Carafate. Hopefully she will continue to stabilize and go home in the morning.   ____________________________ Ocie Cornfield. Ouida Sills, MD mwa:jm D: 02/21/2013 18:13:05 ET T: 02/21/2013 18:55:39 ET JOB#: [id number]  cc: Ocie Cornfield. Ouida Sills, MD, <Dictator> Kirk Ruths MD ELECTRONICALLY SIGNED 02/22/2013 7:09

## 2015-03-27 NOTE — Discharge Summary (Signed)
PATIENT NAME:  Bethany Lambert, Bethany Lambert MR#:  [id number] DATE OF BIRTH:  07-14-1934  DATE OF ADMISSION:  11/19/2013 DATE OF DISCHARGE:  11/20/2013  DISCHARGE DIAGNOSES: 1. Acute exacerbation of chronic obstructive pulmonary disease.  2. Lung cancer in remission.   HISTORY OF PRESENT ILLNESS: Please see initial history and physical for details. Briefly, this is a 81 year old woman with known COPD and lung cancer currently in remission with a recent CT scan done by Dr. Ma Hillock. She has had 1 to 2 weeks of increasing cough and shortness of breath. This worsened over the last several days and she came to the ER and was admitted with COPD exacerbation.   HOSPITAL COURSE BY ISSUE: She was treated with IV Solu-Medrol, nebulizers and azithromycin and ceftriaxone. Chest x-ray was negative. She improved with this treatment. She is on 2 L home O2 and was able to be weaned to that same level. She has had no further cough and is feeling at her baseline.   DISCHARGE MEDICATIONS: Please see discharge [place] summary. Briefly, she will be set on a prednisone taper over the next 12 days. She will also be on azithromycin for five days.   DISCHARGE FOLLOWUP:  The patient will follow up with Dr. Ola Spurr in 1 to 2 weeks.   DISCHARGE DIET: Low salt, regular consistency.   DISCHARGE INSTRUCTIONS: The patient will return if she gets short of breath, fevers, chills, or other concerning symptoms. This discharge took 35 minutes.   ____________________________ Cheral Marker. Ola Spurr, MD dpf:sg D: 11/20/2013 08:45:49 ET T: 11/20/2013 09:06:04 ET JOB#: [id number]  cc: Cheral Marker. Ola Spurr, MD, <Dictator> Beryl Balz Ola Spurr MD ELECTRONICALLY SIGNED 11/23/2013 20:52

## 2015-03-27 NOTE — Discharge Summary (Signed)
PATIENT NAME:  Bethany Lambert, Bethany Lambert MR#:  [id number] DATE OF BIRTH:  Oct 07, 1934  DATE OF ADMISSION:  02/21/2013 DATE OF DISCHARGE:  02/22/2013  DISCHARGE DIAGNOSES: 1.  Acute chronic obstructive pulmonary disease exacerbation.  2.  Lung cancer.   HISTORY OF PRESENT ILLNESS: Please see admission history and physical. Briefly, this is a 81 year old woman undergoing chemoradiation for lung cancer. She was recently discharged after a COPD exacerbation. She came in with increased cough, sputum production, difficulty breathing and difficulty talking due to shortness or breath. She had had some improvement with nebulizer in the ER but was admitted for further treatment.   HOSPITAL COURSE BY ISSUE:  1.  COPD exacerbation. The patient was treated with nebulizers and steroids. She stabilized over 24 hours and was discharged home with outpatient followup.  Nebs were arranged at home.   DISCHARGE MEDICATIONS: Please see [place] discharge reconciliation meds.   DISCHARGE FOLLOWUP:  The patient will follow up with Dr. Ola Spurr in 1 to 2 weeks' time. The patient will have home health services with physical therapy and a nurse.   DISCHARGE DIET: Low sodium.   DISCHARGE ACTIVITY: As tolerated.    ____________________________ Cheral Marker. Ola Spurr, MD dpf:cs D: 02/27/2013 14:49:00 ET T: 02/27/2013 15:07:28 ET JOB#: [id number]  cc: Cheral Marker. Ola Spurr, MD, <Dictator> Concha Sudol Ola Spurr MD ELECTRONICALLY SIGNED 03/06/2013 15:42

## 2015-03-27 NOTE — Consult Note (Signed)
ONCOLOGY followup - sitting at bedside. States still has DOE but slowly improving. no fever. Denies bleeding issuesweak, on [place] O2. NAD          vitals - 98, 91, 22, 170/71, 99% on 2L O2.          lungs - b/l decreased BS           abd - soft, nontender          ext - trace edema.  Hb 8.7, WBC 3500, platelets 145K, Cr 1.09.  Impression/Recommendations:  81 year old female patient with known history of nonsmall cell left lung cancer, started concurrent radiation and chemotherapy with weekly Taxol and carboplatin on December 19, 2012, most recent dose of Taxol and Carboplatin was on February 26th, currently admitted with chronic obstructive pulmonary disease exacerbation, progressive weakness and intermittent dizziness. Clinically slowly improving. Hb better after 1 unit PRBC tx yesterday. Agree with ongoing supportive treatment including bronchodilators, intravenous hydration, steroids and oxygen. Continue to monitor blood counts while in hospital and transfuse as indicated. I have also discussed with the patient that given her borderline poor performance status and recurrent episodes of sickness and dizziness, we may need to stop chemotherapy at this time. This is her week off of chemotherapy and will therefore followup at the [facility] otherwise as already scheduled and then rediscuss this issue based upon how she is doing at that time. No acute pain issues at this time. Will continue to follow as indicated during hospitalization. The patient was explained above, agreeable to this plan.  Electronic Signatures: Jonn Shingles (MD)  (Signed on 04-Mar-14 22:22)  Authored  Last Updated: 04-Mar-14 22:22 by Jonn Shingles (MD)

## 2015-03-27 NOTE — Consult Note (Signed)
PATIENT NAME:  Bethany Lambert, Bethany Lambert MR#:  [id number] DATE OF BIRTH:  [birth year]  DATE OF CONSULTATION:  11/19/2012  CONSULTING PHYSICIAN:  Ceasar Lund. Anselm Jungling, MD PRIMARY CARE PHYSICIAN: Dr. Netty Starring and Dr. Ola Spurr PRIMARY PULMONOLOGIST: Dr. Devona Konig   CHIEF COMPLAINT AND REASON FOR MEDICAL CONSULT: Chronic obstructive pulmonary disease and medical management.   HISTORY OF PRESENT ILLNESS: This 81 year old female who has history of chronic obstructive pulmonary disease, hypertension, and gastroesophageal reflux disease has been using home oxygen and was recently admitted to the hospital for her chronic obstructive pulmonary disease exacerbation and pneumonia; and during her hospital stay she was found having some mass on the left side of the lung. A pulmonary consult was done, and the plan was to do a CT-guided biopsy of the lung mass during the last admission; but due to some emergencies in the radiology department it could not be done, and the decision was made to discharge her as she had improved from her symptoms with tapering oral steroids and oral antibiotics and call her for CT-guided lung biopsy today. During the procedure today, she developed pneumothorax and so they placed a chest tube and admitted her under surgical care for the floor and called medical consult for management of medical issues. On questioning the patient, she denies any symptoms, just says that she has some pain while moving or taking deep breaths at the site of the procedure.    REVIEW OF SYSTEMS:  CONSTITUTIONAL: Symptoms are negative for fever, fatigue, weakness, pain or weight loss.  EYES: No blurring, double vision, redness or any discharge.  ENT: No tinnitus, ear pain, or hearing loss.  RESPIRATORY: Denies any cough or wheezing. Has some pain in the left side of the chest at the site where the procedure is done, and there is a chest tube right now.  CARDIOVASCULAR: Denies any orthopnea, edema, arrhythmia,  palpitations or syncopal episodes.  GASTROINTESTINAL: Denies nausea, vomiting, diarrhea, or abdominal pain.  GENITOURINARY: Denies any hematuria, frequency, or incontinence.  ENDOCRINE: Denies polyhydruria, nocturia, or heat or cold intolerance.  SKIN: Denies any acne, rashes or lesions.  MUSCULOSKELETAL: Denies any pain in neck, back, shoulders or hips.  NEUROLOGICAL: Denies any numbness, weakness, headache, tremors.  PSYCHIATRIC: Denies any anxiety, insomnia or depression.   PAST MEDICAL HISTORY: Positive for hypertension, chronic obstructive pulmonary disease and gastroesophageal reflux disease.   ALLERGIES: Ciprofloxacin.   PAST SURGICAL HISTORY: Partial colon resection due to obstruction, hysterectomy, cataract surgery and hernioplasty.    FAMILY HISTORY: Positive for coronary artery disease and CABG in her father, negative for cancer and negative for diabetes.   SOCIAL HISTORY: Positive for 40 years of 2 packs a day tobacco smoking, quit 12 years ago. No alcohol. The patient did not do any IV drugs. She is currently having home oxygen use at 2 liters nasal cannula, recently discharged from the hospital on 11/15/2012.   DISCHARGE MEDICATIONS: At that time and that is what she was taking currently are: Lisinopril 20 mg once a day, diltiazem 120 mg/24-hour oral capsule once a day, metoprolol 50 mg oral tablet 2 times a day, omeprazole 40 mg delayed-release capsule once a day, potassium chloride 10 mEq once a day, multivitamin once a day, Spiriva 18 mcg inhaled capsule once a day, Symbicort 2 puffs every 12 hours, Ventolin 2 puffs inhaled every 6 hours as needed for shortness of breath, cetirizine 10 mg oral tablet once a day, docusate sodium 100 mg once a day, prednisone 10 mg oral tablet was  given to be tapered 10 mg daily, and she was supposed to take 30 mg today, 20 tomorrow, and 10 the day after and then take it off, acetaminophen 325 mg oral tablet 2 tablets every 4 hours as needed for  pain, doxazosin 4 mg oral tablet every 12 hours and cefuroxime 250 mg oral tablet every 12 hours.   PHYSICAL EXAMINATION: VITAL SIGNS: Temperature 99.3, pulse rate 88, respirations 18, blood pressure 176/68 and oxygen saturation 100% on 3 liters nasal cannula oxygen supplementation.  GENERAL: She is fully alert, oriented, cooperative to physical examination and history taking, does not appear in any acute distress, just has some mild pain on the chest at the site of the procedure.  HEAD, EARS, EYES, NOSE and THROAT Head and neck atraumatic. Conjunctivae pink. Oral mucosa moist.  NECK: Supple. No JVD.  RESPIRATION: Decreased air entry on the left side. There is a chest tube present with draining under the water container, and there are multiple air bubbles coming with each breathing in the water. Right-sided good air entry and no wheezing present.  CARDIOVASCULAR: S1, S2 present, regular. No murmur appreciated.  GASTROINTESTINAL: Soft, nontender, bowel sounds present. No organomegaly appreciated.  LEGS: No edema.  SKIN: No rashes.  NEUROLOGICAL: Power 5 out of 5 all four limbs. Follows commands. She is unable to sit up due to pain at the site of the procedure and due to chest tube. No tremors.  PSYCHIATRIC: Does not appear in any acute psychiatric illness at this point of time.   LABORATORY AND RADIOLOGICAL DATA: Chest x-ray, portable, shows less than 10% left apical pneumothorax. There are not any other hematological or BMP labs done as she is directly admitted from radiology procedure. I ordered  the labs, and we will have to wait for the results.   ASSESSMENT AND PLAN: 1. Recent admission for COPD exacerbation and pneumonia: She was still on tapering dose of steroid and oral cefuroxime, so we will continue tapering steroid 30 mg today and then taper 10 mg daily. Currently there are no signs of exacerbation, so we will give DuoNebs 6 hourly. We will continue her home medications. We will continue  oxygen as she is taking at home and antibiotic also. Her pulmonologist is Dr. Devona Konig, so we will consult him to come and see her tomorrow.  2. Hypertension: She is started on her home medication, lisinopril, metoprolol and diltiazem. Blood pressure is a little on the high side at this time, but that may be possibly because of the anxiety of this event, and she might have missed proper dosing because of her being in the hospital since morning. So, currently I will not anything, but if it is not well controlled then might have to add something tomorrow.  3. Gastroesophageal reflux disease:  Nexium orally.  4. Hyperglycemia: Might happen due to steroid use, so glucose checks with insulin coverage.  5. Deep vein thrombosis prophylaxis: Will not be given due to pneumothorax and chest tube. We will give sequential compressive device and elastic stockings to her.   CODE STATUS:  FULL CODE.     TOTAL TIME SPENT: 50 minutes.     She is a [employer] patient, and we will endorse to the on-call doctor in the evening.  ____________________________ Ceasar Lund. Anselm Jungling, MD vgv:cb D: 11/19/2012 20:52:08 ET T: 11/20/2012 12:40:27 ET JOB#: [id number]  cc: Ceasar Lund. Anselm Jungling, MD, <Dictator> Vaughan Basta MD ELECTRONICALLY SIGNED 12/31/2012 8:19

## 2015-03-27 NOTE — Discharge Summary (Signed)
PATIENT NAME:  Bethany Lambert, KIRSTEN MR#:  [id number] DATE OF BIRTH:  09-04-1934  DATE OF ADMISSION:  02/03/2013 DATE OF DISCHARGE: 02/07/2013    PRIMARY CARE PHYSICIAN: Cheral Marker. Ola Spurr, MD  ONCOLOGIST: Rhett Bannister. Ma Hillock, MD  RADIATION ONCOLOGIST: Armstead Peaks, MD   DISCHARGE DIAGNOSES:  1. Chronic obstructive pulmonary disease exacerbation.  2. Lung cancer, ongoing chemoradiation.  3. Nonsustained ventricular tachycardia due to hypomagnesemia.  4. Hypomagnesemia.  5. Hyponatremia.  6. Acute on chronic anemia.   HISTORY OF PRESENT ILLNESS: Please see admission history and physical. Briefly, this is a 81 year old with recently diagnosed non-small cell lung cancer, undergoing chemo and radiation. Admitted with shortness of breath, dyspnea on exertion and dizziness. Felt likely COPD exacerbation.   HOSPITAL COURSE BY ISSUE:  1. Chronic obstructive pulmonary disease exacerbation. She was treated with nebulizers as well as IV steroids and had slow steady improvement. On the day of discharge, she was on her baseline home O2 and not wheezing and felt much less short of breath.  2. Tachycardia and run of nonsustained ventricular tachycardia. On admission, she was tachycardic, which improved with IV fluids. She was continued on her beta blocker and calcium channel blocker. On the second hospital day, she had a run of nonsustained ventricular tachycardia. This was likely due to a very low magnesium level. This was repleted, and she had no further events. Echocardiogram was unrevealing.  3. Anemia. Her hemoglobin decreased down to 7.4. This was acute on chronic. Likely from ongoing chemo and malignancy. She received 1 unit, and hemoglobin increased to 9.8. She will continue to follow with Dr. Ma Hillock for both her anemia and her lung cancer.  4. Lung cancer. She said was seen by Dr. Ma Hillock and Dr. Baruch Gouty, and she will follow up as an outpatient.  5. Hyponatremia. Her sodium decreased during the  hospitalization. She was given normal saline, with improvement in her sodium to 133 at day of discharge.  6. Dizziness. This improved with IV fluids.  7. Debility. The patient was seen by PT, although felt she was too high functioning for home PT. She lives independently with her son and has a lot of family who help care for her.   DISCHARGE MEDICATIONS: 1. Metoprolol 50 once a day.  2. Potassium chloride 10 mEq once a day.  3. Spiriva once a day.  4. Symbicort 2 puffs twice a day.  5. Diltiazem 120 once a day.  6. Albuterol 2 puffs q.6 hours as needed.  7. Melatonin 5 mg at bedtime.  8. Multivitamin.  9. Magnesium oxide 400 mg twice a day for 10 days.  10. Sucralfate 1 gram in 10 mL orally 4 times a day.  11. Omeprazole 40 mg increased to twice a day.  12. Prednisone 20 mg taper with 3 tablets a day for 3 days, then 2 tablets a day for 3 days, then 1 tablet a day for 3 days.   DISCHARGE DIET: Regular consistency, low-sodium.   DISCHARGE OXYGEN: 2 liters.   DISCHARGE ACTIVITY: As tolerated.   FOLLOWUP: The patient will follow up with Dr. Ola Spurr and Dr. Ma Hillock within 1 to 2 weeks.   TIME SPENT: This discharge took 35 minutes.    ____________________________ Cheral Marker. Ola Spurr, MD dpf:OSi D: 02/07/2013 09:14:14 ET T: 02/07/2013 09:38:33 ET JOB#: [id number]  cc: Cheral Marker. Ola Spurr, MD, <Dictator> DAVID Ola Spurr MD ELECTRONICALLY SIGNED 02/08/2013 22:01

## 2015-03-28 DIAGNOSIS — J449 Chronic obstructive pulmonary disease, unspecified: Secondary | ICD-10-CM | POA: Diagnosis not present

## 2015-04-08 DIAGNOSIS — H8113 Benign paroxysmal vertigo, bilateral: Secondary | ICD-10-CM | POA: Diagnosis not present

## 2015-04-08 DIAGNOSIS — H8111 Benign paroxysmal vertigo, right ear: Secondary | ICD-10-CM | POA: Diagnosis not present

## 2015-04-10 DIAGNOSIS — J449 Chronic obstructive pulmonary disease, unspecified: Secondary | ICD-10-CM | POA: Diagnosis not present

## 2015-05-16 ENCOUNTER — Other Ambulatory Visit: Payer: Self-pay

## 2015-05-16 DIAGNOSIS — C3492 Malignant neoplasm of unspecified part of left bronchus or lung: Secondary | ICD-10-CM

## 2015-05-18 ENCOUNTER — Inpatient Hospital Stay: Payer: Medicare Other | Attending: Internal Medicine

## 2015-05-18 ENCOUNTER — Ambulatory Visit
Admission: RE | Admit: 2015-05-18 | Discharge: 2015-05-18 | Disposition: A | Discharge status: Home / Self Care | Payer: Medicare Other | Source: Ambulatory Visit | Attending: Internal Medicine | Admitting: Internal Medicine

## 2015-05-18 DIAGNOSIS — C3490 Malignant neoplasm of unspecified part of unspecified bronchus or lung: Secondary | ICD-10-CM

## 2015-05-18 DIAGNOSIS — Z85118 Personal history of other malignant neoplasm of bronchus and lung: Secondary | ICD-10-CM | POA: Diagnosis not present

## 2015-05-18 DIAGNOSIS — J449 Chronic obstructive pulmonary disease, unspecified: Secondary | ICD-10-CM | POA: Diagnosis not present

## 2015-05-18 DIAGNOSIS — C3412 Malignant neoplasm of upper lobe, left bronchus or lung: Secondary | ICD-10-CM | POA: Insufficient documentation

## 2015-05-18 DIAGNOSIS — I10 Essential (primary) hypertension: Secondary | ICD-10-CM | POA: Insufficient documentation

## 2015-05-18 DIAGNOSIS — C3492 Malignant neoplasm of unspecified part of left bronchus or lung: Secondary | ICD-10-CM

## 2015-05-18 DIAGNOSIS — Z79899 Other long term (current) drug therapy: Secondary | ICD-10-CM | POA: Diagnosis not present

## 2015-05-18 DIAGNOSIS — Z08 Encounter for follow-up examination after completed treatment for malignant neoplasm: Secondary | ICD-10-CM | POA: Diagnosis not present

## 2015-05-18 DIAGNOSIS — D649 Anemia, unspecified: Secondary | ICD-10-CM | POA: Diagnosis not present

## 2015-05-18 DIAGNOSIS — J439 Emphysema, unspecified: Secondary | ICD-10-CM | POA: Insufficient documentation

## 2015-05-18 LAB — CBC WITH DIFFERENTIAL/PLATELET
Basophils Absolute: 0.1 10*3/uL (ref 0–0.1)
Basophils Relative: 1 %
Eosinophils Absolute: 0.4 10*3/uL (ref 0–0.7)
Eosinophils Relative: 5 %
HCT: 35.4 % (ref 35.0–47.0)
HEMOGLOBIN: 11.7 g/dL — AB (ref 12.0–16.0)
LYMPHS ABS: 1.7 10*3/uL (ref 1.0–3.6)
Lymphocytes Relative: 25 %
MCH: 30.7 pg (ref 26.0–34.0)
MCHC: 33.1 g/dL (ref 32.0–36.0)
MCV: 92.6 fL (ref 80.0–100.0)
MONO ABS: 0.5 10*3/uL (ref 0.2–0.9)
MONOS PCT: 8 %
NEUTROS ABS: 4.3 10*3/uL (ref 1.4–6.5)
NEUTROS PCT: 61 %
Platelets: 163 10*3/uL (ref 150–440)
RBC: 3.82 MIL/uL (ref 3.80–5.20)
RDW: 13.3 % (ref 11.5–14.5)
WBC: 7 10*3/uL (ref 3.6–11.0)

## 2015-05-18 LAB — HEPATIC FUNCTION PANEL
ALK PHOS: 78 U/L (ref 38–126)
ALT: 19 U/L (ref 14–54)
AST: 23 U/L (ref 15–41)
Albumin: 3.8 g/dL (ref 3.5–5.0)
Total Bilirubin: 0.5 mg/dL (ref 0.3–1.2)
Total Protein: 6.5 g/dL (ref 6.5–8.1)

## 2015-05-18 LAB — BASIC METABOLIC PANEL
Anion gap: 6 (ref 5–15)
BUN: 13 mg/dL (ref 6–20)
CALCIUM: 9.3 mg/dL (ref 8.9–10.3)
CO2: 29 mmol/L (ref 22–32)
Chloride: 101 mmol/L (ref 101–111)
Creatinine, Ser: 0.88 mg/dL (ref 0.44–1.00)
GLUCOSE: 118 mg/dL — AB (ref 65–99)
Potassium: 3.9 mmol/L (ref 3.5–5.1)
SODIUM: 136 mmol/L (ref 135–145)

## 2015-05-18 MED ORDER — IOHEXOL 300 MG/ML  SOLN
75.0000 mL | Freq: Once | INTRAMUSCULAR | Status: AC | PRN
  Administered 2015-05-18: 75 mL via INTRAVENOUS

## 2015-05-25 ENCOUNTER — Inpatient Hospital Stay (HOSPITAL_BASED_OUTPATIENT_CLINIC_OR_DEPARTMENT_OTHER): Payer: Medicare Other | Admitting: Internal Medicine

## 2015-05-25 ENCOUNTER — Encounter: Payer: Self-pay | Admitting: Internal Medicine

## 2015-05-25 VITALS — BP 136/66 | HR 75 | Temp 98.5°F | Resp 18 | Ht <= 58 in | Wt 123.5 lb

## 2015-05-25 DIAGNOSIS — J449 Chronic obstructive pulmonary disease, unspecified: Secondary | ICD-10-CM

## 2015-05-25 DIAGNOSIS — C3411 Malignant neoplasm of upper lobe, right bronchus or lung: Secondary | ICD-10-CM

## 2015-05-25 DIAGNOSIS — I10 Essential (primary) hypertension: Secondary | ICD-10-CM | POA: Diagnosis not present

## 2015-05-25 DIAGNOSIS — Z79899 Other long term (current) drug therapy: Secondary | ICD-10-CM

## 2015-05-25 DIAGNOSIS — D649 Anemia, unspecified: Secondary | ICD-10-CM

## 2015-05-25 DIAGNOSIS — C3412 Malignant neoplasm of upper lobe, left bronchus or lung: Secondary | ICD-10-CM

## 2015-06-04 NOTE — Progress Notes (Signed)
[facility]  [phone] [phone]     ID: Bethany Lambert OB: 06-04-1934  MR#: [id number]  AYT#:[id number]  Patient Care Team: Adrian Prows, MD as PCP - General (Infectious Diseases)  CHIEF COMPLAINT/DIAGNOSIS:  Non-small cell (squamous) left lung cancer, likely clinical stage IIIA given T4 tumor invading the mediastinum.  Diagnosed by CT-guided biopsy of left lung mass on12/16/13. Patient got radiation, and concurrent chemotherapy with weekly Taxol/Carboplatin on 12/19/12 (stopped chemo after 2 cycles on 01/30/13 due to poor tolerance).  HISTORY OF PRESENT ILLNESS:  Patient returns for continued oncology evaluation for lung cancer surveillance. She was last seen about 6 months ago. States that chronic weakness and fatigability is unchanged, she has chronic dyspnea on exertion on nasal cannula oxygen, chronic cough is unchanged. Denies any new sputum, chest pain or hemoptysis. No fevers or chills. She is still very weak overall and ambulates minimally at home, has oxygen dependent COPD. Denies headaches, imbalance or falls. Eating steady, has dropped about 6 pounds since June but states that this is likely due to stopping regular cheese intake. Has chronic mild arthritis, no new bone pains.  No other pain issues, 0/10.   REVIEW OF SYSTEMS:   ROS As in HPI above. In addition, no fever, chills or sweats. No new headaches or focal weakness.  No new mood disturbances. No  sore throat or dysphagia. No abdominal pain, constipation, diarrhea, dysuria or hematuria. No new skin rash or bleeding symptoms. No new paresthesias in extremities. PS ECOG 3.  PAST MEDICAL HISTORY: Reviewed. Past Medical History  Diagnosis Date  . COPD (chronic obstructive pulmonary disease)   . GERD (gastroesophageal reflux disease)   . Hypertension   . Erythrocytosis   . Polycythemia   . Kidney failure   . Hyperlipidemia   . Lung cancer     squamous, stage 3          COPD,  oxygen dependent.  GERD.   Colon resection.  Hysterectomy.   Hypertension.  Cataract surgery.   Hernia repair.  Erythrocytosis (secondary).  PAST SURGICAL HISTORY: Reviewed. Past Surgical History  Procedure Laterality Date  . Colon resection    . Cataract extraction    . Hernia repair    . Total abdominal hysterectomy      FAMILY HISTORY: Reviewed. Family History  Problem Relation Age of Onset  . Heart attack Father   . Cancer Son   Denies malignancy. Remarkable for heart disease  ADVANCED DIRECTIVES:  <no information>  SOCIAL HISTORY: Reviewed. History  Substance Use Topics  . Smoking status: Former Smoker -- 2.00 packs/day for 40 years  . Smokeless tobacco: Not on file  . Alcohol Use: No  Denies smoking, currently. History of 40 pack-year in the past, quit about 12 years ago. Denies alcohol usage.  Allergies  Allergen Reactions  . Ciprofloxacin   . Norvasc [Amlodipine Besylate]     Current Outpatient Prescriptions  Medication Sig Dispense Refill  . albuterol (PROVENTIL HFA;VENTOLIN HFA) 108 (90 BASE) MCG/ACT inhaler Inhale 2 puffs into the lungs every 6 (six) hours as needed for wheezing.    . budesonide-formoterol (SYMBICORT) 80-4.5 MCG/ACT inhaler Inhale 2 puffs into the lungs as needed.     . Cyanocobalamin (VITAMIN B-12 IJ) Inject as directed every 30 (thirty) days.    Marland Kitchen diltiazem (DILACOR XR) 120 MG 24 hr capsule Take 120 mg by mouth daily.    . magnesium oxide (MAG-OX) 400 MG tablet Take 400 mg by mouth 2 (two) times  daily.    . metoprolol (LOPRESSOR) 50 MG tablet Take 1 tablet (50 mg total) by mouth 2 (two) times daily.    . Multiple Vitamin (MULTIVITAMIN) tablet Take 1 tablet by mouth daily.    Marland Kitchen omeprazole (PRILOSEC) 20 MG capsule Takes 20 mg am and 40 mg pm daily.    . potassium chloride (K-DUR,KLOR-CON) 10 MEQ tablet Take 10 mEq by mouth daily.    . Prenatal Vit-Fe Fumarate-FA (PRENAVITE MULTIPLE VITAMIN PO) Take by mouth daily.    Marland Kitchen tiotropium  (SPIRIVA) 18 MCG inhalation capsule Place 18 mcg into inhaler and inhale daily.     No current facility-administered medications for this visit.    PHYSICAL EXAM: Filed Vitals:   05/25/15 1435  BP: 136/66  Pulse: 75  Temp: 98.5 F (36.9 C)  Resp: 18     Body mass index is 25.81 kg/(m^2).    ECOG FS:3 - Symptomatic, >50% confined to bed  GENERAL: Chronically weak, sitting in wheelchair, otherwise alert and oriented and in no acute distress. There is no icterus. HEENT: EOMs intact. No cervical lymphadenopathy. CVS: S1S2, regular LUNGS: Bilaterally clear to auscultation, no rhonchi. ABDOMEN: Soft, nontender. No hepatosplenomegaly clinically.  NEURO: grossly nonfocal, cranial nerves are intact.   EXTREMITIES: No pedal edema.   LAB RESULTS:    Component Value Date/Time   NA 136 05/17/2015 1016   NA 136 12/29/2014 0827   K 3.9 05/17/2015 1016   K 4.0 12/29/2014 0827   CL 101 05/17/2015 1016   CL 102 12/29/2014 0827   CO2 29 05/17/2015 1016   CO2 28 12/29/2014 0827   GLUCOSE 118* 05/17/2015 1016   GLUCOSE 115* 12/29/2014 0827   BUN 13 05/17/2015 1016   BUN 15 12/29/2014 0827   CREATININE 0.88 05/17/2015 1016   CREATININE 1.23 12/29/2014 0827   CALCIUM 9.3 05/17/2015 1016   CALCIUM 8.8 12/29/2014 0827   PROT 6.5 05/17/2015 1016   PROT 6.3* 12/01/2014 1355   ALBUMIN 3.8 05/17/2015 1016   ALBUMIN 3.2* 12/01/2014 1355   AST 23 05/17/2015 1016   AST 14* 12/01/2014 1355   ALT 19 05/17/2015 1016   ALT 22 12/01/2014 1355   ALKPHOS 78 05/17/2015 1016   ALKPHOS 100 12/01/2014 1355   BILITOT 0.5 05/17/2015 1016   GFRNONAA >60 05/17/2015 1016   GFRNONAA 42* 08/13/2014 1020   GFRAA >60 05/17/2015 1016   GFRAA 49* 08/13/2014 1020   Lab Results  Component Value Date   WBC 7.0 05/17/2015   NEUTROABS 4.3 05/17/2015   HGB 11.7* 05/17/2015   HCT 35.4 05/17/2015   MCV 92.6 05/17/2015   PLT 163 05/17/2015     STUDIES: 05/21/14 - CT scan of chest. IMPRESSION:  Further  decrease size of spiculated pulmonary nodule in the central left upper lobe. No evidence of lymphadenopathy. No new or progressive disease within the thorax.  Ct Chest W Contrast  05/18/2015   CLINICAL DATA:  Followup left upper lobe squamous cell carcinoma. Previous chemotherapy and radiation therapy.  EXAM: CT CHEST WITH CONTRAST  TECHNIQUE: Multidetector CT imaging of the chest was performed during intravenous contrast administration.  CONTRAST:  68m OMNIPAQUE IOHEXOL 300 MG/ML  SOLN  COMPARISON:  05/21/2014  FINDINGS: Mediastinum/Lymph Nodes: No masses or pathologically enlarged lymph nodes identified.  Lungs/Pleura: Moderate emphysema again noted. Previously seen small solid-appearing central left upper lobe nodular opacity is no longer visualized. Residual central left upper lobe opacity is less well-defined and shows central air bronchograms, without definite solid component, and  is most consistent with post radiation changes. Other scattered sub-cm pulmonary nodules show no significant change. No evidence of pleural effusion.  Musculoskeletal/Soft Tissues: No suspicious bone lesions or other significant chest wall abnormality.  Upper Abdomen: Small right adrenal nodule remains stable and consistent with a benign adenoma. Small cystic areas in the pancreatic body and tail are also stable and consistent with benign etiology.  IMPRESSION: Central left upper lobe/suprahilar post radiation changes, without definite residual pulmonary nodule or mass. No new or progressive disease identified.  No evidence of lymphadenopathy or metastatic disease within the thorax.   Electronically Signed   By: Earle Gell M.D.   On: 05/18/2015 14:04    ASSESSMENT / PLAN:   1. Non-small cell (squamous) left lung cancer, likely clinical stage IIIA given T4 tumor invading the mediastinum.  Diagnosed by CT-guided biopsy of left lung mass on12/16/13. Got radiation and concurrent chemotherapy with weekly Taxol/Carboplatin on  12/19/12 (stopped chemo after 2 cycles on 01/30/13 due to poor tolerance) -  have independently reviewed CT scan of the chest done 05/18/15 and discussed with patient, also have reviewed lab reports from today and discussed with patient. Overall seems to be doing about the same, continues to have chronic symptoms of COPD oxygen dependent. Otherwise no hemoptysis or chest pain to clinically suggest progressive lung cancer. CT scan done on 05/18/15 reported no disease progression or new lesions. Plan is to continue surveillance. Will order next surveillance CT scan of the chest along with labs including CBC, creatinine, LFTs at 8 months from now, and will see her back at 1-2 days after the scan is done and make further plan of management.  2. Anemia - mild, no progressive symptoms. Likely anemia of chronic disease. Continue to monitor upon next visit here. 3. In between visits, the patient has been advised to call or come to the ER in case of fevers, bleeding, acute sickness or worsening symptoms.  She is agreeable to this plan.   Leia Alf, MD   06/04/2015 10:32 PM

## 2015-08-03 ENCOUNTER — Other Ambulatory Visit: Payer: Self-pay

## 2015-08-03 ENCOUNTER — Emergency Department: Payer: Medicare Other

## 2015-08-03 ENCOUNTER — Emergency Department
Admission: EM | Admit: 2015-08-03 | Discharge: 2015-08-03 | Disposition: A | Discharge status: Home / Self Care | Payer: Medicare Other | Attending: Emergency Medicine | Admitting: Emergency Medicine

## 2015-08-03 ENCOUNTER — Encounter: Payer: Self-pay | Admitting: Emergency Medicine

## 2015-08-03 DIAGNOSIS — Z87891 Personal history of nicotine dependence: Secondary | ICD-10-CM | POA: Diagnosis not present

## 2015-08-03 DIAGNOSIS — J441 Chronic obstructive pulmonary disease with (acute) exacerbation: Secondary | ICD-10-CM

## 2015-08-03 DIAGNOSIS — R0602 Shortness of breath: Secondary | ICD-10-CM | POA: Diagnosis present

## 2015-08-03 DIAGNOSIS — I10 Essential (primary) hypertension: Secondary | ICD-10-CM | POA: Insufficient documentation

## 2015-08-03 DIAGNOSIS — Z79899 Other long term (current) drug therapy: Secondary | ICD-10-CM | POA: Insufficient documentation

## 2015-08-03 LAB — CBC
HCT: 32.2 % — ABNORMAL LOW (ref 35.0–47.0)
HEMOGLOBIN: 11.2 g/dL — AB (ref 12.0–16.0)
MCH: 31.8 pg (ref 26.0–34.0)
MCHC: 34.9 g/dL (ref 32.0–36.0)
MCV: 91.2 fL (ref 80.0–100.0)
PLATELETS: 174 10*3/uL (ref 150–440)
RBC: 3.53 MIL/uL — AB (ref 3.80–5.20)
RDW: 13.3 % (ref 11.5–14.5)
WBC: 7 10*3/uL (ref 3.6–11.0)

## 2015-08-03 LAB — COMPREHENSIVE METABOLIC PANEL
ALK PHOS: 79 U/L (ref 38–126)
ALT: 17 U/L (ref 14–54)
AST: 22 U/L (ref 15–41)
Albumin: 3.9 g/dL (ref 3.5–5.0)
Anion gap: 8 (ref 5–15)
BUN: 12 mg/dL (ref 6–20)
CALCIUM: 9.3 mg/dL (ref 8.9–10.3)
CHLORIDE: 98 mmol/L — AB (ref 101–111)
CO2: 27 mmol/L (ref 22–32)
CREATININE: 0.88 mg/dL (ref 0.44–1.00)
GFR calc non Af Amer: >60 mL/min (ref >60)
GLUCOSE: 100 mg/dL — AB (ref 65–99)
Potassium: 4.6 mmol/L (ref 3.5–5.1)
SODIUM: 133 mmol/L — AB (ref 135–145)
Total Bilirubin: 0.3 mg/dL (ref 0.3–1.2)
Total Protein: 6.7 g/dL (ref 6.5–8.1)

## 2015-08-03 MED ORDER — PREDNISONE 20 MG PO TABS
40.0000 mg | ORAL_TABLET | Freq: Once | ORAL | Status: AC
  Administered 2015-08-03: 40 mg via ORAL
  Filled 2015-08-03: qty 2

## 2015-08-03 MED ORDER — IPRATROPIUM-ALBUTEROL 0.5-2.5 (3) MG/3ML IN SOLN
3.0000 mL | Freq: Once | RESPIRATORY_TRACT | Status: AC
  Administered 2015-08-03: 3 mL via RESPIRATORY_TRACT
  Filled 2015-08-03: qty 3

## 2015-08-03 MED ORDER — AZITHROMYCIN 250 MG PO TABS: ORAL_TABLET | ORAL | Status: DC

## 2015-08-03 MED ORDER — PREDNISONE 20 MG PO TABS: 40.0000 mg | ORAL_TABLET | Freq: Every day | ORAL | Status: DC

## 2015-08-03 MED ORDER — GUAIFENESIN-CODEINE 100-6.3 MG/5ML PO SOLN: 5.0000 mL | Freq: Four times a day (QID) | ORAL | Status: DC | PRN

## 2015-08-03 NOTE — Discharge Instructions (Signed)

## 2015-08-03 NOTE — ED Notes (Signed)
Pt states she has hx of COPD with increased sob since last night, denies any cp, pt is on home o2 at 2.5 liters, states she had to have o2 increased to 3 liters last night, also has been coughing

## 2015-08-03 NOTE — ED Provider Notes (Signed)
[facility] Emergency Department Provider Note  ____________________________________________  Time seen: 10:50 AM  I have reviewed the triage vital signs and the nursing notes.   HISTORY  Chief Complaint Shortness of Breath    HPI GENENE KILMAN is a 81 y.o. female who complains of shortness of breath since last night. No chest pain. No exertional symptoms. She did have some sinus congestion over the last 2-3 days preceding this. She was using her inhalers at home without relief. She has had increased her nasal cannula oxygen slightly from 2.5-3 L at home for comfort. She does report a productive cough without fevers or chills.     Past Medical History  Diagnosis Date  . COPD (chronic obstructive pulmonary disease)   . GERD (gastroesophageal reflux disease)   . Hypertension   . Erythrocytosis   . Polycythemia   . Kidney failure   . Hyperlipidemia   . Lung cancer     squamous, stage 3     Patient Active Problem List   Diagnosis Date Noted  . Tachycardia 02/03/2013  . Dyspnea 02/03/2013     Past Surgical History  Procedure Laterality Date  . Colon resection    . Cataract extraction    . Hernia repair    . Total abdominal hysterectomy       Current Outpatient Rx  Name  Route  Sig  Dispense  Refill  . albuterol (PROVENTIL HFA;VENTOLIN HFA) 108 (90 BASE) MCG/ACT inhaler   Inhalation   Inhale 2 puffs into the lungs every 6 (six) hours as needed for wheezing.         Marland Kitchen azithromycin (ZITHROMAX Z-PAK) 250 MG tablet      Take 2 tablets (500 mg) on  Day 1,  followed by 1 tablet (250 mg) once daily on Days 2 through 5.   6 each   0   . budesonide-formoterol (SYMBICORT) 80-4.5 MCG/ACT inhaler   Inhalation   Inhale 2 puffs into the lungs as needed.          . Cyanocobalamin (VITAMIN B-12 IJ)   Injection   Inject as directed every 30 (thirty) days.         Marland Kitchen diltiazem (DILACOR XR) 120 MG 24 hr capsule   Oral   Take 120 mg by  mouth daily.         . Guaifenesin-Codeine 100-6.3 MG/5ML SOLN   Oral   Take 5 mLs by mouth every 6 (six) hours as needed.   100 mL   0   . magnesium oxide (MAG-OX) 400 MG tablet   Oral   Take 400 mg by mouth 2 (two) times daily.         . metoprolol (LOPRESSOR) 50 MG tablet   Oral   Take 1 tablet (50 mg total) by mouth 2 (two) times daily.         . Multiple Vitamin (MULTIVITAMIN) tablet   Oral   Take 1 tablet by mouth daily.         Marland Kitchen omeprazole (PRILOSEC) 20 MG capsule      Takes 20 mg am and 40 mg pm daily.         . potassium chloride (K-DUR,KLOR-CON) 10 MEQ tablet   Oral   Take 10 mEq by mouth daily.         . predniSONE (DELTASONE) 20 MG tablet   Oral   Take 2 tablets (40 mg total) by mouth daily.   8 tablet  0   . Prenatal Vit-Fe Fumarate-FA (PRENAVITE MULTIPLE VITAMIN PO)   Oral   Take by mouth daily.         Marland Kitchen tiotropium (SPIRIVA) 18 MCG inhalation capsule   Inhalation   Place 18 mcg into inhaler and inhale daily.            Allergies Ciprofloxacin and Norvasc   Family History  Problem Relation Age of Onset  . Heart attack Father   . Cancer Son     Social History Social History  Substance Use Topics  . Smoking status: Former Smoker -- 2.00 packs/day for 40 years  . Smokeless tobacco: None  . Alcohol Use: No    Review of Systems  Constitutional:   No fever or chills. No weight changes Eyes:   No blurry vision or double vision.  ENT:   No sore throat. Positive congestion Cardiovascular:   No chest pain. Respiratory:   Shortness of breath with productive cough Gastrointestinal:   Negative for abdominal pain, vomiting and diarrhea.  No BRBPR or melena. Genitourinary:   Negative for dysuria, urinary retention, bloody urine, or difficulty urinating. Musculoskeletal:   Negative for back pain. No joint swelling or pain. Skin:   Negative for rash. Neurological:   Negative for headaches, focal weakness or  numbness. Psychiatric:  No anxiety or depression.   Endocrine:  No hot/cold intolerance, changes in energy, or sleep difficulty.  10-point ROS otherwise negative.  ____________________________________________   PHYSICAL EXAM:  VITAL SIGNS: ED Triage Vitals  Enc Vitals Group     BP --      Pulse Rate 08/03/15 1034 64     Resp 08/03/15 1034 32     Temp 08/03/15 1034 98.7 F (37.1 C)     Temp Source 08/03/15 1034 Oral     SpO2 08/03/15 1034 98 %     Weight 08/03/15 1034 121 lb (54.885 kg)     Height 08/03/15 1034 '4\' 10"'$  (1.473 m)     Head Cir --      Peak Flow --      Pain Score 08/03/15 1035 0     Pain Loc --      Pain Edu? --      Excl. in [place]? --      Constitutional:   Alert and oriented. Well appearing and in no distress. Speaking in long sentences. Eyes:   No scleral icterus. No conjunctival pallor. PERRL. EOMI ENT   Head:   Normocephalic and atraumatic.   Nose:   No congestion/rhinnorhea. No septal hematoma   Mouth/Throat:   MMM, no pharyngeal erythema. No peritonsillar mass. No uvula shift.   Neck:   No stridor. No SubQ emphysema. No meningismus. Hematological/Lymphatic/Immunilogical:   No cervical lymphadenopathy. Cardiovascular:   RRR. Normal and symmetric distal pulses are present in all extremities. No murmurs, rubs, or gallops. Respiratory:   Normal respiratory effort without retractions. Mildly prolonged expiratory phase with expiratory wheezing and mild tachypnea.  Gastrointestinal:   Soft and nontender. No distention. There is no CVA tenderness.  No rebound, rigidity, or guarding. Genitourinary:   deferred Musculoskeletal:   Nontender with normal range of motion in all extremities. No joint effusions.  No lower extremity tenderness.  No edema. Neurologic:   Normal speech and language.  CN 2-10 normal. Motor grossly intact. No pronator drift.  Normal gait. No gross focal neurologic deficits are appreciated.  Skin:    Skin is warm, dry and  intact. No rash noted.  No  petechiae, purpura, or bullae. Psychiatric:   Mood and affect are normal. Speech and behavior are normal. Patient exhibits appropriate insight and judgment.  ____________________________________________    LABS (pertinent positives/negatives) (all labs ordered are listed, but only abnormal results are displayed) Labs Reviewed  CBC - Abnormal; Notable for the following:    RBC 3.53 (*)    Hemoglobin 11.2 (*)    HCT 32.2 (*)    All other components within normal limits  COMPREHENSIVE METABOLIC PANEL - Abnormal; Notable for the following:    Sodium 133 (*)    Chloride 98 (*)    Glucose, Bld 100 (*)    All other components within normal limits   ____________________________________________   EKG  Interpreted by me  Date: 08/03/2015  Rate: 65  Rhythm: normal sinus rhythm  QRS Axis: normal  Intervals: normal  ST/T Wave abnormalities: normal  Conduction Disutrbances: none  Narrative Interpretation: unremarkable      ____________________________________________    RADIOLOGY  Chest x-ray unremarkable  ____________________________________________   PROCEDURES   ____________________________________________   INITIAL IMPRESSION / ASSESSMENT AND PLAN / ED COURSE  Pertinent labs & imaging results that were available during my care of the patient were reviewed by me and considered in my medical decision making (see chart for details).  Patient presents with COPD exacerbation. She is very well appearing nontoxic no acute distress. No respiratory distress. Low suspicion of ACS PE TAD pneumothorax carditis mediastinitis. We'll treat her for coming acquired pneumonia given the underlying lung disease and productive cough. He is given prednisone and DuoNeb here in the ED and I'll keep her on a course of prednisone as well as some Tussionex for symptoms.     ____________________________________________   FINAL CLINICAL IMPRESSION(S) / ED  DIAGNOSES  Final diagnoses:  COPD with acute exacerbation      Carrie Mew, MD 08/03/15 1136

## 2016-01-13 ENCOUNTER — Ambulatory Visit: Payer: Medicare Other | Admitting: Radiation Oncology

## 2016-01-25 ENCOUNTER — Ambulatory Visit
Admission: RE | Admit: 2016-01-25 | Discharge: 2016-01-25 | Disposition: A | Discharge status: Home / Self Care | Payer: Medicare Other | Source: Ambulatory Visit | Attending: Internal Medicine | Admitting: Internal Medicine

## 2016-01-25 DIAGNOSIS — C3411 Malignant neoplasm of upper lobe, right bronchus or lung: Secondary | ICD-10-CM | POA: Insufficient documentation

## 2016-01-25 DIAGNOSIS — Z01818 Encounter for other preprocedural examination: Secondary | ICD-10-CM | POA: Insufficient documentation

## 2016-01-25 LAB — POCT I-STAT CREATININE: CREATININE: 0.9 mg/dL (ref 0.44–1.00)

## 2016-01-25 MED ORDER — IOHEXOL 300 MG/ML  SOLN
75.0000 mL | Freq: Once | INTRAMUSCULAR | Status: AC | PRN
  Administered 2016-01-25: 75 mL via INTRAVENOUS

## 2016-01-26 ENCOUNTER — Other Ambulatory Visit: Payer: Self-pay | Admitting: *Deleted

## 2016-01-26 ENCOUNTER — Encounter: Payer: Self-pay | Admitting: *Deleted

## 2016-01-26 DIAGNOSIS — C3411 Malignant neoplasm of upper lobe, right bronchus or lung: Secondary | ICD-10-CM

## 2016-01-27 ENCOUNTER — Ambulatory Visit: Payer: Medicare Other | Admitting: Internal Medicine

## 2016-01-27 ENCOUNTER — Ambulatory Visit: Payer: Medicare Other

## 2016-01-27 ENCOUNTER — Other Ambulatory Visit: Payer: Self-pay | Admitting: *Deleted

## 2016-01-27 ENCOUNTER — Inpatient Hospital Stay: Payer: Medicare Other | Attending: Internal Medicine | Admitting: Internal Medicine

## 2016-01-27 ENCOUNTER — Ambulatory Visit
Admission: RE | Admit: 2016-01-27 | Discharge: 2016-01-27 | Disposition: A | Discharge status: Home / Self Care | Payer: Medicare Other | Source: Ambulatory Visit | Attending: Radiation Oncology | Admitting: Radiation Oncology

## 2016-01-27 ENCOUNTER — Encounter: Payer: Self-pay | Admitting: Radiation Oncology

## 2016-01-27 VITALS — BP 130/48 | HR 64 | Temp 97.6°F | Resp 20 | Wt 119.5 lb

## 2016-01-27 VITALS — BP 130/48 | HR 64 | Temp 97.6°F | Resp 18 | Ht <= 58 in | Wt 119.5 lb

## 2016-01-27 DIAGNOSIS — K219 Gastro-esophageal reflux disease without esophagitis: Secondary | ICD-10-CM | POA: Insufficient documentation

## 2016-01-27 DIAGNOSIS — C3411 Malignant neoplasm of upper lobe, right bronchus or lung: Secondary | ICD-10-CM

## 2016-01-27 DIAGNOSIS — R911 Solitary pulmonary nodule: Secondary | ICD-10-CM | POA: Insufficient documentation

## 2016-01-27 DIAGNOSIS — C3492 Malignant neoplasm of unspecified part of left bronchus or lung: Secondary | ICD-10-CM | POA: Insufficient documentation

## 2016-01-27 DIAGNOSIS — R300 Dysuria: Secondary | ICD-10-CM

## 2016-01-27 DIAGNOSIS — Z79899 Other long term (current) drug therapy: Secondary | ICD-10-CM | POA: Insufficient documentation

## 2016-01-27 DIAGNOSIS — J449 Chronic obstructive pulmonary disease, unspecified: Secondary | ICD-10-CM | POA: Insufficient documentation

## 2016-01-27 DIAGNOSIS — Z87891 Personal history of nicotine dependence: Secondary | ICD-10-CM | POA: Diagnosis not present

## 2016-01-27 DIAGNOSIS — I10 Essential (primary) hypertension: Secondary | ICD-10-CM | POA: Insufficient documentation

## 2016-01-27 DIAGNOSIS — E785 Hyperlipidemia, unspecified: Secondary | ICD-10-CM | POA: Insufficient documentation

## 2016-01-27 LAB — COMPREHENSIVE METABOLIC PANEL
ALBUMIN: 3.8 g/dL (ref 3.5–5.0)
ALK PHOS: 77 U/L (ref 38–126)
ALT: 19 U/L (ref 14–54)
ANION GAP: 6 (ref 5–15)
AST: 24 U/L (ref 15–41)
BUN: 11 mg/dL (ref 6–20)
CALCIUM: 9.2 mg/dL (ref 8.9–10.3)
CO2: 31 mmol/L (ref 22–32)
Chloride: 102 mmol/L (ref 101–111)
Creatinine, Ser: 0.89 mg/dL (ref 0.44–1.00)
GFR calc Af Amer: >60 mL/min (ref >60)
GFR calc non Af Amer: >60 mL/min (ref >60)
GLUCOSE: 81 mg/dL (ref 65–99)
Potassium: 4.1 mmol/L (ref 3.5–5.1)
SODIUM: 139 mmol/L (ref 135–145)
Total Bilirubin: 0.5 mg/dL (ref 0.3–1.2)
Total Protein: 6.8 g/dL (ref 6.5–8.1)

## 2016-01-27 LAB — CBC WITH DIFFERENTIAL/PLATELET
BASOS ABS: 0.1 10*3/uL (ref 0–0.1)
BASOS PCT: 1 %
EOS ABS: 0.2 10*3/uL (ref 0–0.7)
Eosinophils Relative: 3 %
HCT: 33.8 % — ABNORMAL LOW (ref 35.0–47.0)
HEMOGLOBIN: 11.5 g/dL — AB (ref 12.0–16.0)
Lymphocytes Relative: 20 %
Lymphs Abs: 1.6 10*3/uL (ref 1.0–3.6)
MCH: 31.4 pg (ref 26.0–34.0)
MCHC: 34.1 g/dL (ref 32.0–36.0)
MCV: 92.1 fL (ref 80.0–100.0)
MONOS PCT: 8 %
Monocytes Absolute: 0.6 10*3/uL (ref 0.2–0.9)
NEUTROS ABS: 5.4 10*3/uL (ref 1.4–6.5)
NEUTROS PCT: 68 %
Platelets: 234 10*3/uL (ref 150–440)
RBC: 3.67 MIL/uL — AB (ref 3.80–5.20)
RDW: 13.3 % (ref 11.5–14.5)
WBC: 7.9 10*3/uL (ref 3.6–11.0)

## 2016-01-27 LAB — URINALYSIS COMPLETE WITH MICROSCOPIC (ARMC ONLY)
BACTERIA UA: NONE SEEN
Bilirubin Urine: NEGATIVE
Glucose, UA: NEGATIVE mg/dL
HGB URINE DIPSTICK: NEGATIVE
Ketones, ur: NEGATIVE mg/dL
Nitrite: NEGATIVE
PROTEIN: NEGATIVE mg/dL
SPECIFIC GRAVITY, URINE: 1.017 (ref 1.005–1.030)
pH: 5 (ref 5.0–8.0)

## 2016-01-27 NOTE — Progress Notes (Signed)
[facility] OFFICE PROGRESS NOTE  Patient Care Team: Adrian Prows, MD as PCP - General (Infectious Diseases)   SUMMARY OF ONCOLOGIC HISTORY:  # DEC 2013- Squamous cell CA [ Stage IIIA; T4- invading mediastinum; CT Bx] s/p carbo-taxol with RT [stopped chemo x 2 cycles; Feb 26th 2014- poor tol]; CT feb 2017- Stable Left lung changes; ~12x45m RUL  # COPD Home O2 2.5L   INTERVAL HISTORY: This is my first interaction with the patient since I joined the practice September 2016. I reviewed the patient's prior charts/pertinent labs/imaging in detail; findings are summarized above.   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  Patient denies any unusual shortness of breath or cough- she has chronic shortness of breath, COPD currently on home O2. No hemoptysis no headaches no vision changes or double vision.  Denies any weight loss or abdominal pain nausea vomiting.   REVIEW OF SYSTEMS:  A complete 10 point review of system is done which is negative except mentioned above/history of present illness.   PAST MEDICAL HISTORY :  Past Medical History  Diagnosis Date  . COPD (chronic obstructive pulmonary disease) ([facility]   . GERD (gastroesophageal reflux disease)   . Hypertension   . Erythrocytosis   . Polycythemia   . Kidney failure   . Hyperlipidemia   . Lung cancer ([facility]     squamous, stage 3 non small cell lung ca  . History of chemotherapy   . History of radiation therapy     PAST SURGICAL HISTORY :   Past Surgical History  Procedure Laterality Date  . Colon resection    . Cataract extraction    . Hernia repair    . Total abdominal hysterectomy    . Ct guided biopsy  ([facility] hx)  11/19/2012    lung    FAMILY HISTORY :   Family History  Problem Relation Age of Onset  . Heart attack Father   .  Cancer Son     SOCIAL HISTORY:   Social History  Substance Use Topics  . Smoking status: Former Smoker -- 2.00 packs/day for 40 years    Types: Cigarettes  . Smokeless tobacco: Never Used  . Alcohol Use: No    ALLERGIES:  is allergic to ciprofloxacin; fluocinolone; and norvasc.  MEDICATIONS:  Current Outpatient Prescriptions  Medication Sig Dispense Refill  . albuterol (PROVENTIL HFA;VENTOLIN HFA) 108 (90 BASE) MCG/ACT inhaler Inhale 2 puffs into the lungs every 6 (six) hours as needed for wheezing.    . budesonide-formoterol (SYMBICORT) 80-4.5 MCG/ACT inhaler Inhale 2 puffs into the lungs as needed.     . calcium carbonate (OS-CAL) 1250 (500 Ca) MG chewable tablet Chew 1 tablet by mouth daily.    . Cyanocobalamin (VITAMIN B-12 IJ) Inject as directed every 30 (thirty) days.    .Marland Kitchendiltiazem (DILACOR XR) 120 MG 24 hr capsule Take 120 mg by mouth daily.    .Marland KitchenguaiFENesin (MUCINEX) 600 MG 12 hr tablet Take 600 mg by mouth 2 (two) times daily.    . Guaifenesin-Codeine 100-6.3 MG/5ML SOLN Take 5 mLs by mouth every 6 (six) hours as needed. (Patient not taking: Reported on 01/27/2016) 100 mL 0  . ipratropium-albuterol (DUONEB) 0.5-2.5 (3) MG/3ML SOLN Take 3 mLs by nebulization.    .Marland Kitchenlisinopril (PRINIVIL,ZESTRIL) 20 MG tablet Take by mouth. Reported on  01/27/2016    . magnesium oxide (MAG-OX) 400 MG tablet Take 400 mg by mouth 2 (two) times daily. Reported on 01/27/2016    . metoprolol (LOPRESSOR) 50 MG tablet Take 1 tablet (50 mg total) by mouth 2 (two) times daily.    . Multiple Vitamin (MULTIVITAMIN) tablet Take 1 tablet by mouth daily.    Marland Kitchen omeprazole (PRILOSEC) 20 MG capsule Takes 20 mg am and 40 mg pm daily.    . potassium chloride (K-DUR,KLOR-CON) 10 MEQ tablet Take 10 mEq by mouth daily.    . predniSONE (DELTASONE) 20 MG tablet Take 2 tablets (40 mg total) by mouth daily. (Patient not taking: Reported on 01/27/2016) 8 tablet 0  . Prenatal Vit-Fe Fumarate-FA (PRENAVITE MULTIPLE VITAMIN PO)  Take by mouth daily. Reported on 01/27/2016    . tiotropium (SPIRIVA) 18 MCG inhalation capsule Place 18 mcg into inhaler and inhale daily.     No current facility-administered medications for this visit.    PHYSICAL EXAMINATION:   BP 130/48 mmHg  Pulse 64  Temp(Src) 97.6 F (36.4 C) (Tympanic)  Resp 18  Ht '4\' 10"'$  (1.473 m)  Wt 119 lb 7.8 oz (54.2 kg)  BMI 24.98 kg/m2  SpO2 98%  Filed Weights   01/27/16 1100  Weight: 119 lb 7.8 oz (54.2 kg)    GENERAL: patient appears frail; wearing her oxygen. She is walking herself. Alert, no distress and comfortable.  Accompanied  By daughter-in-law.  EYES: no pallor or icterus OROPHARYNX: no thrush or ulceration; good dentition  NECK: supple, no masses felt LYMPH:  no palpable lymphadenopathy in the cervical, axillary or inguinal regions LUNGS: decreased air entry bilaterally. No wheeze or crackles HEART/CVS: regular rate & rhythm and no murmurs; No lower extremity edema ABDOMEN:abdomen soft, non-tender and normal bowel sounds Musculoskeletal:no cyanosis of digits and no clubbing  PSYCH: alert & oriented x 3 with fluent speech NEURO: no focal motor/sensory deficits SKIN:  no rashes or significant lesions  LABORATORY DATA:  I have reviewed the data as listed    Component Value Date/Time   NA 133* 08/03/2015 1056   NA 136 12/29/2014 0827   K 4.6 08/03/2015 1056   K 4.0 12/29/2014 0827   CL 98* 08/03/2015 1056   CL 102 12/29/2014 0827   CO2 27 08/03/2015 1056   CO2 28 12/29/2014 0827   GLUCOSE 100* 08/03/2015 1056   GLUCOSE 115* 12/29/2014 0827   BUN 12 08/03/2015 1056   BUN 15 12/29/2014 0827   CREATININE 0.90 01/25/2016 1100   CREATININE 1.23 12/29/2014 0827   CALCIUM 9.3 08/03/2015 1056   CALCIUM 8.8 12/29/2014 0827   PROT 6.7 08/03/2015 1056   PROT 6.3* 12/01/2014 1355   ALBUMIN 3.9 08/03/2015 1056   ALBUMIN 3.2* 12/01/2014 1355   AST 22 08/03/2015 1056   AST 14* 12/01/2014 1355   ALT 17 08/03/2015 1056   ALT 22  12/01/2014 1355   ALKPHOS 79 08/03/2015 1056   ALKPHOS 100 12/01/2014 1355   BILITOT 0.3 08/03/2015 1056   BILITOT 0.3 12/01/2014 1355   GFRNONAA >60 08/03/2015 1056   GFRNONAA 45* 12/29/2014 0827   GFRNONAA 42* 08/13/2014 1020   GFRAA >60 08/03/2015 1056   GFRAA 54* 12/29/2014 0827   GFRAA 49* 08/13/2014 1020    No results found for: SPEP, UPEP  Lab Results  Component Value Date   WBC 7.0 08/03/2015   NEUTROABS 4.3 05/17/2015   HGB 11.2* 08/03/2015   HCT 32.2* 08/03/2015   MCV 91.2 08/03/2015  PLT 174 08/03/2015      Chemistry      Component Value Date/Time   NA 133* 08/03/2015 1056   NA 136 12/29/2014 0827   K 4.6 08/03/2015 1056   K 4.0 12/29/2014 0827   CL 98* 08/03/2015 1056   CL 102 12/29/2014 0827   CO2 27 08/03/2015 1056   CO2 28 12/29/2014 0827   BUN 12 08/03/2015 1056   BUN 15 12/29/2014 0827   CREATININE 0.90 01/25/2016 1100   CREATININE 1.23 12/29/2014 0827      Component Value Date/Time   CALCIUM 9.3 08/03/2015 1056   CALCIUM 8.8 12/29/2014 0827   ALKPHOS 79 08/03/2015 1056   ALKPHOS 100 12/01/2014 1355   AST 22 08/03/2015 1056   AST 14* 12/01/2014 1355   ALT 17 08/03/2015 1056   ALT 22 12/01/2014 1355   BILITOT 0.3 08/03/2015 1056   BILITOT 0.3 12/01/2014 1355       RADIOGRAPHIC STUDIES: I have personally reviewed the radiological images as listed and agreed with the findings in the report. Ct Chest W Contrast  01/25/2016  CLINICAL DATA:  Restaging lung cancer. Chronic fall and shortness of breath. Some weight loss. EXAM: CT CHEST WITH CONTRAST TECHNIQUE: Multidetector CT imaging of the chest was performed during intravenous contrast administration. CONTRAST:  74m OMNIPAQUE IOHEXOL 300 MG/ML  SOLN COMPARISON:  CT 05/21/2014 and 05/22/2015.  Radiographs 08/03/2015. FINDINGS: Mediastinum/Nodes: There are no enlarged mediastinal, hilar or axillary lymph nodes. The thyroid gland, trachea and esophagus demonstrate no significant findings. The  heart size is normal. There is no pericardial effusion. There is diffuse atherosclerosis of the aorta, great vessels and coronary arteries. Lungs/Pleura: There is no pleural effusion. Moderate emphysema again noted. The left suprahilar radiation changes are grossly stable. There is a new solid right upper lobe lesion measuring 8 x 11 mm on image 19 at site of previous ground-glass density. Other scattered ground-glass densities in the right lung and small bilateral pulmonary nodules are unchanged. Upper abdomen: The visualized upper abdomen has a stable appearance with a small right adrenal adenoma. Musculoskeletal/Chest wall: There is no chest wall mass or suspicious osseous finding. IMPRESSION: 1. New right upper lobe nodule at site of previous ground-glass density, worrisome for developing adenocarcinoma (although potentially inflammatory). This lesion is borderline in size for evaluation with PET-CT. Management options include PET-CT and follow-up chest CT in 3-6 months. 2. Post treatment changes in the left upper lobe are stable. 3. Other pulmonary nodules and ground-glass densities in both lungs are stable. No adenopathy or acute findings. Electronically Signed   By: WRichardean SaleM.D.   On: 01/25/2016 14:19     ASSESSMENT & PLAN:   # stage IIIA/T4-invading the mediastinum/squamous cell carcinoma of the left lung- status post chemoradiation 2014. The most recent CT scan from February shows- stable left lung changes; however development of a new right upper lobe lung nodule. ECOG PS-2  # NEW Right upper lobe lung nodule 11 x 8 mm in size- concerning for malignancy versus inflammatory change. We will review the images at the tumor conference tomorrow. Patient has been evaluated by Dr. CDonella Stade she is awaiting a repeat CAT scan 6 months.  # I reviewed the images myself/reviewed the images with the patient and daughter-in-law. She was given a copy of the report.patient was asked to give [place] call  tomorrow to review the tumor conference recommendations.   # 25 minutes face-to-face with the patient discussing the above plan of care; more than  50% of time spent on prognosis/ natural history; counseling and coordination.     Bethany Sickle, MD 01/27/2016 11:06 AM

## 2016-01-27 NOTE — Progress Notes (Signed)
Radiation Oncology Follow up Note  Name: Bethany Lambert   Date:   01/27/2016 MRN:  [id number] DOB: 08/13/1934    This 82 y.o. 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  REFERRING PROVIDER: Adrian Prows, MD  HPI: Patient is a 82 year old female now out 3 years having completed combined modality treatment for stage IIIa (T4 based on invasion of mediastinum) squamous cell carcinoma treated with carbotaxol and radiation therapy. She is seen today in routine follow-up and is doing fairly well she continues on nasal oxygen supplements. She has no cough hemoptysis or chest tightness. Unfortunately recent repeat CT scan shows less than 1 cm developing density in the right upper lobe compatible with probable non-small cell lung cancer. Follow-up CT scan was recommended.. The left chest is stable.  COMPLICATIONS OF TREATMENT: none  FOLLOW UP COMPLIANCE: keeps appointments   PHYSICAL EXAM:  BP 130/48 mmHg  Pulse 64  Temp(Src) 97.6 F (36.4 C)  Resp 20  Wt 119 lb 7.8 oz (54.2 kg) Well-developed female in NAD on nasal oxygen wheelchair-bound. Well-developed well-nourished patient in NAD. HEENT reveals PERLA, EOMI, discs not visualized.  Oral cavity is clear. No oral mucosal lesions are identified. Neck is clear without evidence of cervical or supraclavicular adenopathy. Lungs are clear to A&P. Cardiac examination is essentially unremarkable with regular rate and rhythm without murmur rub or thrill. Abdomen is benign with no organomegaly or masses noted. Motor sensory and DTR levels are equal and symmetric in the upper and lower extremities. Cranial nerves II through XII are grossly intact. Proprioception is intact. No peripheral adenopathy or edema is identified. No motor or sensory levels are noted. Crude visual fields are within normal range.  RADIOLOGY RESULTS: Recent CT scan is  reviewed compatible with the above-stated findings.  PLAN: At this time I'm concerned about the new lesion in the right upper lobe. I will wait another 6 months repeat her CT scan should there be progression will probably refer for endobronchial ultrasound for tissue confirmation. Patient would be candidate for SB RT treatment. This was all explained in detail to the patient. She will see medical oncology today. Follow-up and CT scan were ordered. Patient knows to call sooner with any concerns.  I would like to take this opportunity for allowing me to participate in the care of your patient.Armstead Peaks., MD

## 2016-01-28 LAB — URINE CULTURE

## 2016-02-09 ENCOUNTER — Telehealth: Payer: Self-pay | Admitting: *Deleted

## 2016-02-09 NOTE — Telephone Encounter (Signed)
Patient states she was here about a month ago and had labs and UA.  Would like someone to call her with those results.

## 2016-02-09 NOTE — Telephone Encounter (Signed)
Called pt and gave her the results and they are good. Then pt mentioned that she has 2 nodules one under her chin  About the size of pinto bean. She also states that she needs an appt for scan in 3 months instead of 6 months. I told her that I would check with Dr B and call her tom and she is agreeable.

## 2016-02-12 ENCOUNTER — Telehealth: Payer: Self-pay | Admitting: Internal Medicine

## 2016-02-12 ENCOUNTER — Other Ambulatory Visit: Payer: Self-pay | Admitting: Internal Medicine

## 2016-02-12 NOTE — Telephone Encounter (Signed)
Left message to call us back re: follow up re: CT scan/ Dr.Kasa appt.

## 2016-02-18 ENCOUNTER — Inpatient Hospital Stay: Admission: RE | Admit: 2016-02-18 | Payer: Medicare Other | Source: Ambulatory Visit

## 2016-02-18 ENCOUNTER — Telehealth: Payer: Self-pay | Admitting: *Deleted

## 2016-02-18 NOTE — Telephone Encounter (Signed)
Pt called stating she is cancelling her pre-assessment appointment and her bronch procedure on Monday. States her breathing is not well and that she is concerned she will not make it through the procedure. Offered the pt an appt to come in this morning to see Dr. Mortimer Fries to discuss the procedure face to face with him and she refused. She states she thought the procedure was going to be done in 3 months and it has only been a month. I have called and cancelled her pre-assessment appt and her bronch appt. Thanks.

## 2016-02-19 ENCOUNTER — Telehealth: Payer: Self-pay | Admitting: *Deleted

## 2016-02-19 NOTE — Telephone Encounter (Signed)
I was asked to call pt because we got a call from dr Jenell Milliner office stating that pt refused to have bronch when they called to tell her the date.  i spoke with pt and she feels like she is not in good shape to be put under fo rbx. She has COPD, lung cancer and she is 82 years old. When she went her first round through this she ended up with collapsed lung, tube in because she had lots of fluid.  She feels like it is slow growing from 1 cm to 3 cm in the last 1 12/ years.  She did not get all the scheduled radiation treatment. All she can remember was  They told her she had enough treatments and she is completed.  She feels that she just needs to wait and watch.  She would like to have a scan but just of her chest and not all the other parts.  She has something going on with her right ear and under chin. She is getting appt with PCP to look into that.  I told her that I would call her and let her know what Dr. B says and get back with her tom.  I also asked would it change her mind if she has a office visit with the pulm. Doctor that was going to do her bronch and she said no.  She says she sees Dr. Humphrey Rolls for pulm. On regular basis and he told her he did not want her put under because of pulm. Status and her teeth issue.

## 2016-02-22 ENCOUNTER — Other Ambulatory Visit: Payer: Self-pay | Admitting: *Deleted

## 2016-02-22 ENCOUNTER — Telehealth: Payer: Self-pay | Admitting: *Deleted

## 2016-02-22 ENCOUNTER — Encounter: Admission: RE | Payer: Self-pay | Source: Ambulatory Visit

## 2016-02-22 ENCOUNTER — Ambulatory Visit: Admission: RE | Admit: 2016-02-22 | Payer: Medicare Other | Source: Ambulatory Visit | Admitting: Internal Medicine

## 2016-02-22 DIAGNOSIS — C3492 Malignant neoplasm of unspecified part of left bronchus or lung: Secondary | ICD-10-CM

## 2016-02-22 SURGERY — ELECTROMAGNETIC NAVIGATION BRONCHOSCOPY
Anesthesia: General

## 2016-02-22 NOTE — Telephone Encounter (Signed)
Pt contacted and told her that after I spoke to Dr. B he preferred the bx but understands why pt does not want one done at this time.  He is agreeable to the ct of chest in may and then see pt after a few days.  Pt is agreeable to this and schedulers will call the pt and let her know.

## 2016-02-23 ENCOUNTER — Telehealth: Payer: Self-pay | Admitting: Internal Medicine

## 2016-02-23 NOTE — Telephone Encounter (Signed)
Discussed with Dr. Mortimer Fries; since patient declined pulmonary evaluation this time. We will get a CT scan in approximately 3 months from her previous scan/May 2017; patient will follow-up with me after that.

## 2016-02-26 ENCOUNTER — Other Ambulatory Visit: Payer: Self-pay | Admitting: Physician Assistant

## 2016-02-26 DIAGNOSIS — D381 Neoplasm of uncertain behavior of trachea, bronchus and lung: Secondary | ICD-10-CM

## 2016-02-29 ENCOUNTER — Other Ambulatory Visit: Payer: Self-pay | Admitting: Physician Assistant

## 2016-02-29 DIAGNOSIS — D381 Neoplasm of uncertain behavior of trachea, bronchus and lung: Secondary | ICD-10-CM

## 2016-03-02 ENCOUNTER — Ambulatory Visit
Admission: RE | Admit: 2016-03-02 | Discharge: 2016-03-02 | Disposition: A | Discharge status: Home / Self Care | Payer: Medicare Other | Source: Ambulatory Visit | Attending: Physician Assistant | Admitting: Physician Assistant

## 2016-03-02 DIAGNOSIS — D3501 Benign neoplasm of right adrenal gland: Secondary | ICD-10-CM | POA: Insufficient documentation

## 2016-03-02 DIAGNOSIS — I2510 Atherosclerotic heart disease of native coronary artery without angina pectoris: Secondary | ICD-10-CM | POA: Insufficient documentation

## 2016-03-02 DIAGNOSIS — R918 Other nonspecific abnormal finding of lung field: Secondary | ICD-10-CM | POA: Insufficient documentation

## 2016-03-02 DIAGNOSIS — D381 Neoplasm of uncertain behavior of trachea, bronchus and lung: Secondary | ICD-10-CM

## 2016-03-02 LAB — GLUCOSE, CAPILLARY: Glucose-Capillary: 75 mg/dL (ref 65–99)

## 2016-03-02 MED ORDER — FLUDEOXYGLUCOSE F - 18 (FDG) INJECTION
13.2000 | Freq: Once | INTRAVENOUS | Status: AC | PRN
  Administered 2016-03-02: 13.2 via INTRAVENOUS

## 2016-03-23 ENCOUNTER — Other Ambulatory Visit: Payer: Medicare Other

## 2016-04-22 ENCOUNTER — Other Ambulatory Visit: Payer: Medicare Other

## 2016-04-22 ENCOUNTER — Ambulatory Visit: Payer: Medicare Other

## 2016-04-25 ENCOUNTER — Ambulatory Visit: Payer: Medicare Other | Admitting: Internal Medicine

## 2016-04-25 ENCOUNTER — Other Ambulatory Visit: Payer: Medicare Other

## 2016-05-10 ENCOUNTER — Other Ambulatory Visit: Payer: Self-pay | Admitting: Internal Medicine

## 2016-05-10 DIAGNOSIS — D381 Neoplasm of uncertain behavior of trachea, bronchus and lung: Secondary | ICD-10-CM

## 2016-05-18 ENCOUNTER — Telehealth: Payer: Self-pay | Admitting: *Deleted

## 2016-05-18 NOTE — Telephone Encounter (Addendum)
RN spoke with Dr. Pamella Pert scan of chest w/contrast in August may be cnl. Pt having ct scan of chest w/o contrast-ordered by pcp Dr. Humphrey Rolls on 06/03/16.  Scan in August would be a duplicate test.  Order provided by Dr. Rogue Bussing to cnl ct chest in august. msg sent to [facility]. To cnl this test and to let pt know that ct chest-sch. In August will be cnl.    ----- Message from [place] sent at 05/13/2016  8:45 AM EDT ----- Pt is having this scan on 6/30 so the one for August might need to be cancelled due to insurance . Do you want me to do that?

## 2016-06-03 ENCOUNTER — Ambulatory Visit
Admission: RE | Admit: 2016-06-03 | Discharge: 2016-06-03 | Disposition: A | Discharge status: Home / Self Care | Payer: Medicare Other | Source: Ambulatory Visit | Attending: Internal Medicine | Admitting: Internal Medicine

## 2016-06-03 DIAGNOSIS — I700 Atherosclerosis of aorta: Secondary | ICD-10-CM | POA: Insufficient documentation

## 2016-06-03 DIAGNOSIS — D381 Neoplasm of uncertain behavior of trachea, bronchus and lung: Secondary | ICD-10-CM | POA: Insufficient documentation

## 2016-06-03 DIAGNOSIS — I2510 Atherosclerotic heart disease of native coronary artery without angina pectoris: Secondary | ICD-10-CM | POA: Insufficient documentation

## 2016-06-03 DIAGNOSIS — R911 Solitary pulmonary nodule: Secondary | ICD-10-CM | POA: Insufficient documentation

## 2016-06-03 DIAGNOSIS — J439 Emphysema, unspecified: Secondary | ICD-10-CM | POA: Insufficient documentation

## 2016-06-29 LAB — PULMONARY FUNCTION TEST

## 2016-06-30 ENCOUNTER — Encounter: Payer: Self-pay | Admitting: Radiation Oncology

## 2016-06-30 ENCOUNTER — Ambulatory Visit
Admission: RE | Admit: 2016-06-30 | Discharge: 2016-06-30 | Disposition: A | Discharge status: Home / Self Care | Payer: Medicare Other | Source: Ambulatory Visit | Attending: Radiation Oncology | Admitting: Radiation Oncology

## 2016-06-30 VITALS — BP 161/58 | HR 68 | Temp 98.3°F | Resp 22 | Wt 122.7 lb

## 2016-06-30 DIAGNOSIS — Z9981 Dependence on supplemental oxygen: Secondary | ICD-10-CM | POA: Insufficient documentation

## 2016-06-30 DIAGNOSIS — Z510 Encounter for antineoplastic radiation therapy: Secondary | ICD-10-CM | POA: Insufficient documentation

## 2016-06-30 DIAGNOSIS — R918 Other nonspecific abnormal finding of lung field: Secondary | ICD-10-CM | POA: Diagnosis not present

## 2016-06-30 DIAGNOSIS — J449 Chronic obstructive pulmonary disease, unspecified: Secondary | ICD-10-CM | POA: Diagnosis not present

## 2016-06-30 DIAGNOSIS — C3411 Malignant neoplasm of upper lobe, right bronchus or lung: Secondary | ICD-10-CM

## 2016-06-30 NOTE — Progress Notes (Signed)
Radiation Oncology Follow up Note  Name: Bethany Lambert   Date:   06/30/2016 MRN:  [id number] DOB: 05-05-1934    This 82 y.o. female presents to the clinic today for follow-up for right upper lobe non-small cell lung cancer.  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 We have been following now a approximate 1 cm lesion in the right upper lobe which was hypermetabolic on PET CT scan consistent with non-small cell lung cancer. We've watched for the past 6 months. And recent repeat CT scan showed again spicular nodule in central portion of the right upper lobe again suspicious for small adenocarcinoma. We discussed the case with radiology and based on patient's comorbidities including oxygen dependent significant COPD previous radiation to left lung it was thought to be extremely dangerous to try CT-guided biopsy and possible pneumothorax. She seen today for Route review of her new films. She continues to do well is oxygen dependent specifically denies cough hemoptysis or chest tightness. Left chest is stable.  COMPLICATIONS OF TREATMENT: none  FOLLOW UP COMPLIANCE: keeps appointments   PHYSICAL EXAM:  BP (!) 161/58 (BP Location: Left Arm, Patient Position: Sitting, Cuff Size: Normal)   Pulse 68   Temp 98.3 F (36.8 C)   Resp (!) 22   Wt 122 lb 11 oz (55.7 kg)   BMI 25.64 kg/m  Wheelchair-bound well-developed female in NAD oxygen dependent. Well-developed well-nourished patient in NAD. HEENT reveals PERLA, EOMI, discs not visualized.  Oral cavity is clear. No oral mucosal lesions are identified. Neck is clear without evidence of cervical or supraclavicular adenopathy. Lungs are clear to A&P. Cardiac examination is essentially unremarkable with regular rate and rhythm without murmur rub or thrill. Abdomen is benign with no  organomegaly or masses noted. Motor sensory and DTR levels are equal and symmetric in the upper and lower extremities. Cranial nerves II through XII are grossly intact. Proprioception is intact. No peripheral adenopathy or edema is identified. No motor or sensory levels are noted. Crude visual fields are within normal range.  RADIOLOGY RESULTS: Serial CT scans are reviewed and reviewed with radiology and compatible with the above-stated findings for non-small cell lung cancer  PLAN: At this time radiology does not want to perform CT-guided biopsy and patient adamantly refuses Ace on her prior experience and overall comorbidities. At this time like to go ahead with SB RT to her right upper lobe. Would plan on delivering 5000 cGy in 5 fractions. Risks and benefits of treatment including possible development of cough fatigue possible slight dysphasia secondary to radiation esophagitis and skin reaction all were discussed in detail with the patient and her husband. They both seem to comprehend my treatment plan well. I have personally ordered and scheduled CT simulation for SB RT.  I would like to take this opportunity to thank you for allowing me to participate in the care of your patient.Armstead Peaks., MD

## 2016-07-06 ENCOUNTER — Ambulatory Visit
Admission: RE | Admit: 2016-07-06 | Discharge: 2016-07-06 | Disposition: A | Discharge status: Home / Self Care | Payer: Medicare Other | Source: Ambulatory Visit | Attending: Radiation Oncology | Admitting: Radiation Oncology

## 2016-07-06 ENCOUNTER — Encounter (INDEPENDENT_AMBULATORY_CARE_PROVIDER_SITE_OTHER): Payer: Self-pay

## 2016-07-06 ENCOUNTER — Inpatient Hospital Stay: Payer: Medicare Other | Attending: Internal Medicine | Admitting: Internal Medicine

## 2016-07-06 ENCOUNTER — Other Ambulatory Visit: Payer: Self-pay

## 2016-07-06 DIAGNOSIS — J449 Chronic obstructive pulmonary disease, unspecified: Secondary | ICD-10-CM | POA: Insufficient documentation

## 2016-07-06 DIAGNOSIS — C3412 Malignant neoplasm of upper lobe, left bronchus or lung: Secondary | ICD-10-CM | POA: Insufficient documentation

## 2016-07-06 DIAGNOSIS — I2510 Atherosclerotic heart disease of native coronary artery without angina pectoris: Secondary | ICD-10-CM | POA: Diagnosis not present

## 2016-07-06 DIAGNOSIS — R911 Solitary pulmonary nodule: Secondary | ICD-10-CM | POA: Diagnosis not present

## 2016-07-06 DIAGNOSIS — Z923 Personal history of irradiation: Secondary | ICD-10-CM | POA: Diagnosis not present

## 2016-07-06 DIAGNOSIS — Z87891 Personal history of nicotine dependence: Secondary | ICD-10-CM | POA: Insufficient documentation

## 2016-07-06 DIAGNOSIS — K219 Gastro-esophageal reflux disease without esophagitis: Secondary | ICD-10-CM | POA: Insufficient documentation

## 2016-07-06 DIAGNOSIS — I10 Essential (primary) hypertension: Secondary | ICD-10-CM | POA: Insufficient documentation

## 2016-07-06 DIAGNOSIS — Z79899 Other long term (current) drug therapy: Secondary | ICD-10-CM

## 2016-07-06 DIAGNOSIS — E785 Hyperlipidemia, unspecified: Secondary | ICD-10-CM | POA: Diagnosis not present

## 2016-07-06 DIAGNOSIS — Z9221 Personal history of antineoplastic chemotherapy: Secondary | ICD-10-CM

## 2016-07-06 NOTE — Progress Notes (Signed)
[facility] OFFICE PROGRESS NOTE  Patient Care Team: Leonel Ramsay, MD as PCP - General (Infectious Diseases)   SUMMARY OF ONCOLOGIC HISTORY:  Oncology History   # DEC 2013- Squamous cell CA [ Stage IIIA; T4- invading mediastinum; CT Bx] s/p carbo-taxol with RT [stopped chemo x 2 cycles; Feb 26th 2014- poor tol];   # CT feb 2017- Stable Left lung changes; ~12x86m RUL- June 30th 2017- Solitary- awaiting RT x5 Fx [Dr.Crystal Aug 2017].   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  She had a CAT scan done on June 30 but Dr. KHumphrey Rolls She has met with radiation oncology this morning. Thank you Patient denies any unusual shortness of breath or cough- she has chronic shortness of breath, COPD currently on home O2. No hemoptysis no headaches no vision changes or double vision.  Denies any weight loss or abdominal pain nausea vomiting.   REVIEW OF SYSTEMS:  A complete 10 point review of system is done which is negative except mentioned above/history of present illness.   PAST MEDICAL HISTORY :  Past Medical History:  Diagnosis Date  . [place] disease) [place]   . Erythrocytosis   . GERD (gastroesophageal reflux disease)   . History of chemotherapy   . History of radiation therapy   . Hyperlipidemia   . Hypertension   . Kidney failure   . [place]    squamous, stage 3 non small cell lung ca  . Polycythemia     PAST SURGICAL HISTORY :   Past Surgical History:  Procedure Laterality Date  . CATARACT EXTRACTION    . COLON RESECTION    . CT GUIDED BIOPSY  ([facility] HX)  11/19/2012   lung  . HERNIA REPAIR    . TOTAL ABDOMINAL HYSTERECTOMY      FAMILY HISTORY :   Family  History  Problem Relation Age of Onset  . Heart attack Father   . Cancer Son     SOCIAL HISTORY:   Social History  Substance Use Topics  . Smoking status: Former Smoker    Packs/day: 2.00    Years: 40.00    Types: Cigarettes  . Smokeless tobacco: Never Used  . Alcohol use No    ALLERGIES:  is allergic to ciprofloxacin; fluocinolone; and norvasc [amlodipine besylate].  MEDICATIONS:  Current Outpatient Prescriptions  Medication Sig Dispense Refill  . albuterol (PROVENTIL HFA;VENTOLIN HFA) 108 (90 BASE) MCG/ACT inhaler Inhale 2 puffs into the lungs every 6 (six) hours as needed for wheezing.    . budesonide-formoterol (SYMBICORT) 80-4.5 MCG/ACT inhaler Inhale 2 puffs into the lungs as needed.     . calcium carbonate (OS-CAL) 1250 (500 Ca) MG chewable tablet Chew 1 tablet by mouth daily.    . Cyanocobalamin (VITAMIN B-12 IJ) Inject as directed every 30 (thirty) days.    .Marland Kitchendiltiazem (DILACOR XR) 120 MG 24 hr capsule Take 120 mg by mouth daily.    .Marland KitchenguaiFENesin (MUCINEX) 600 MG 12 hr tablet Take 600 mg by mouth 2 (two) times daily.    .Marland Kitchenipratropium-albuterol (DUONEB) 0.5-2.5 (3) MG/3ML SOLN Take 3 mLs by nebulization.    . metoprolol (LOPRESSOR) 50  MG tablet Take 1 tablet (50 mg total) by mouth 2 (two) times daily.    . Multiple Vitamin (MULTIVITAMIN) tablet Take 1 tablet by mouth daily.    Marland Kitchen omeprazole (PRILOSEC) 20 MG capsule Takes 20 mg am and 40 mg pm daily.    . potassium chloride (K-DUR,KLOR-CON) 10 MEQ tablet Take 10 mEq by mouth daily.    . predniSONE (DELTASONE) 20 MG tablet Take 2 tablets (40 mg total) by mouth daily. 8 tablet 0  . Prenatal Vit-Fe Fumarate-FA (PRENAVITE MULTIPLE VITAMIN PO) Take by mouth daily. Reported on 01/27/2016    . tiotropium (SPIRIVA) 18 MCG inhalation capsule Place 18 mcg into inhaler and inhale daily.    Marland Kitchen triamcinolone cream (KENALOG) 0.1 %      No current facility-administered medications for this visit.     PHYSICAL EXAMINATION:   BP (!)  158/65 (BP Location: Left Arm, Patient Position: Sitting)   Pulse 67   Temp 99.6 F (37.6 C) (Tympanic)   Resp 18   Wt 123 lb 5 oz (55.9 kg)   BMI 25.77 kg/m   Filed Weights   07/06/16 1424  Weight: 123 lb 5 oz (55.9 kg)    GENERAL: patient appears frail; wearing her oxygen. She is walking herself. Alert, no distress and comfortable.  Accompanied  By daughter-in-law.  EYES: no pallor or icterus OROPHARYNX: no thrush or ulceration; good dentition  NECK: supple, no masses felt LYMPH:  no palpable lymphadenopathy in the cervical, axillary or inguinal regions LUNGS: decreased air entry bilaterally. No wheeze or crackles HEART/CVS: regular rate & rhythm and no murmurs; No lower extremity edema ABDOMEN:abdomen soft, non-tender and normal bowel sounds Musculoskeletal:no cyanosis of digits and no clubbing  PSYCH: alert & oriented x 3 with fluent speech NEURO: no focal motor/sensory deficits SKIN:  no rashes or significant lesions  LABORATORY DATA:  I have reviewed the data as listed    Component Value Date/Time   NA 139 01/27/2016 1140   NA 136 12/29/2014 0827   K 4.1 01/27/2016 1140   K 4.0 12/29/2014 0827   CL 102 01/27/2016 1140   CL 102 12/29/2014 0827   CO2 31 01/27/2016 1140   CO2 28 12/29/2014 0827   GLUCOSE 81 01/27/2016 1140   GLUCOSE 115 (H) 12/29/2014 0827   BUN 11 01/27/2016 1140   BUN 15 12/29/2014 0827   CREATININE 0.89 01/27/2016 1140   CREATININE 1.23 12/29/2014 0827   CALCIUM 9.2 01/27/2016 1140   CALCIUM 8.8 12/29/2014 0827   PROT 6.8 01/27/2016 1140   PROT 6.3 (L) 12/01/2014 1355   ALBUMIN 3.8 01/27/2016 1140   ALBUMIN 3.2 (L) 12/01/2014 1355   AST 24 01/27/2016 1140   AST 14 (L) 12/01/2014 1355   ALT 19 01/27/2016 1140   ALT 22 12/01/2014 1355   ALKPHOS 77 01/27/2016 1140   ALKPHOS 100 12/01/2014 1355   BILITOT 0.5 01/27/2016 1140   BILITOT 0.3 12/01/2014 1355   GFRNONAA >60 01/27/2016 1140   GFRNONAA 45 (L) 12/29/2014 0827   GFRNONAA 42 (L)  08/13/2014 1020   GFRAA >60 01/27/2016 1140   GFRAA 54 (L) 12/29/2014 0827   GFRAA 49 (L) 08/13/2014 1020    No results found for: SPEP, UPEP  Lab Results  Component Value Date   WBC 7.9 01/27/2016   NEUTROABS 5.4 01/27/2016   HGB 11.5 (L) 01/27/2016   HCT 33.8 (L) 01/27/2016   MCV 92.1 01/27/2016   PLT 234 01/27/2016      Chemistry  Component Value Date/Time   NA 139 01/27/2016 1140   NA 136 12/29/2014 0827   K 4.1 01/27/2016 1140   K 4.0 12/29/2014 0827   CL 102 01/27/2016 1140   CL 102 12/29/2014 0827   CO2 31 01/27/2016 1140   CO2 28 12/29/2014 0827   BUN 11 01/27/2016 1140   BUN 15 12/29/2014 0827   CREATININE 0.89 01/27/2016 1140   CREATININE 1.23 12/29/2014 0827      Component Value Date/Time   CALCIUM 9.2 01/27/2016 1140   CALCIUM 8.8 12/29/2014 0827   ALKPHOS 77 01/27/2016 1140   ALKPHOS 100 12/01/2014 1355   AST 24 01/27/2016 1140   AST 14 (L) 12/01/2014 1355   ALT 19 01/27/2016 1140   ALT 22 12/01/2014 1355   BILITOT 0.5 01/27/2016 1140   BILITOT 0.3 12/01/2014 1355     IMPRESSION: 10 mm spiculated nodule in the central right upper lobe remains unchanged since 01/25/2016 CT but is new compared to 2016. This showed mild hyper metabolism on recent PET scan, and remains suspicious for a small adenocarcinoma. Tissue sampling should be considered.  Stable post radiation changes in central left upper lobe.  Stable emphysema and other widely scattered bilateral sub-cm pulmonary nodules and ground-glass opacities.  Aortic atherosclerosis and coronary artery calcification again noted.  RADIOGRAPHIC STUDIES: I have personally reviewed the radiological images as listed and agreed with the findings in the report. No results found.   ASSESSMENT & PLAN:   Cancer of upper lobe of left lung ([place]) # stage IIIA/T4-invading the mediastinum/squamous cell carcinoma of the left lung- status post chemoradiation 2014. June 30 CT scan shows stable left  lung scarring from radiation  # NEW Right upper lobe lung nodule 11 x 8 mm in size-suspicious for malignancy/new since 2016; stable from February 2017. Evaluated by Dr. Donella Stade; planned radiation.   # I reviewed the images myself/reviewed the images with the patient and daughter-in-law. She was given a copy of the report.  # Follow-up with me in 3 months with labs- patient will likely need repeat imaging follow-up.  # 25 minutes face-to-face with the patient discussing the above plan of care; more than 50% of time spent on prognosis/ natural history; counseling and coordination.     Cammie Sickle, MD 07/06/2016 5:09 PM

## 2016-07-06 NOTE — Assessment & Plan Note (Addendum)
#   stage IIIA/T4-invading the mediastinum/squamous cell carcinoma of the left lung- status post chemoradiation 2014. June 30 CT scan shows stable left lung scarring from radiation  # NEW Right upper lobe lung nodule 11 x 8 mm in size-suspicious for malignancy/new since 2016; stable from February 2017. Evaluated by Dr. Donella Stade; planned radiation.   # I reviewed the images myself/reviewed the images with the patient and daughter-in-law. She was given a copy of the report.  # Follow-up with me in 3 months with labs- patient will likely need repeat imaging follow-up.  # 25 minutes face-to-face with the patient discussing the above plan of care; more than 50% of time spent on prognosis/ natural history; counseling and coordination.

## 2016-07-07 DIAGNOSIS — R918 Other nonspecific abnormal finding of lung field: Secondary | ICD-10-CM | POA: Diagnosis not present

## 2016-07-14 DIAGNOSIS — R918 Other nonspecific abnormal finding of lung field: Secondary | ICD-10-CM | POA: Diagnosis not present

## 2016-07-15 DIAGNOSIS — R918 Other nonspecific abnormal finding of lung field: Secondary | ICD-10-CM | POA: Diagnosis not present

## 2016-07-18 ENCOUNTER — Ambulatory Visit
Admission: RE | Admit: 2016-07-18 | Discharge: 2016-07-18 | Disposition: A | Discharge status: Home / Self Care | Payer: Medicare Other | Source: Ambulatory Visit | Attending: Radiation Oncology | Admitting: Radiation Oncology

## 2016-07-18 DIAGNOSIS — R918 Other nonspecific abnormal finding of lung field: Secondary | ICD-10-CM | POA: Diagnosis not present

## 2016-07-20 ENCOUNTER — Ambulatory Visit
Admission: RE | Admit: 2016-07-20 | Discharge: 2016-07-20 | Disposition: A | Discharge status: Home / Self Care | Payer: Medicare Other | Source: Ambulatory Visit | Attending: Radiation Oncology | Admitting: Radiation Oncology

## 2016-07-20 DIAGNOSIS — R918 Other nonspecific abnormal finding of lung field: Secondary | ICD-10-CM | POA: Diagnosis not present

## 2016-07-25 ENCOUNTER — Ambulatory Visit
Admission: RE | Admit: 2016-07-25 | Discharge: 2016-07-25 | Disposition: A | Discharge status: Home / Self Care | Payer: Medicare Other | Source: Ambulatory Visit | Attending: Radiation Oncology | Admitting: Radiation Oncology

## 2016-07-25 DIAGNOSIS — R918 Other nonspecific abnormal finding of lung field: Secondary | ICD-10-CM | POA: Diagnosis not present

## 2016-07-27 ENCOUNTER — Ambulatory Visit
Admission: RE | Admit: 2016-07-27 | Discharge: 2016-07-27 | Disposition: A | Discharge status: Home / Self Care | Payer: Medicare Other | Source: Ambulatory Visit | Attending: Radiation Oncology | Admitting: Radiation Oncology

## 2016-07-27 DIAGNOSIS — R918 Other nonspecific abnormal finding of lung field: Secondary | ICD-10-CM | POA: Diagnosis not present

## 2016-07-28 ENCOUNTER — Ambulatory Visit: Payer: Medicare Other

## 2016-08-02 ENCOUNTER — Ambulatory Visit
Admission: RE | Admit: 2016-08-02 | Discharge: 2016-08-02 | Disposition: A | Discharge status: Home / Self Care | Payer: Medicare Other | Source: Ambulatory Visit | Attending: Radiation Oncology | Admitting: Radiation Oncology

## 2016-08-02 DIAGNOSIS — R918 Other nonspecific abnormal finding of lung field: Secondary | ICD-10-CM | POA: Diagnosis not present

## 2016-08-03 ENCOUNTER — Ambulatory Visit: Payer: Medicare Other | Admitting: Radiation Oncology

## 2016-08-03 ENCOUNTER — Ambulatory Visit: Payer: Medicare Other | Admitting: Internal Medicine

## 2016-09-07 ENCOUNTER — Encounter: Payer: Self-pay | Admitting: Radiation Oncology

## 2016-09-07 ENCOUNTER — Ambulatory Visit
Admission: RE | Admit: 2016-09-07 | Discharge: 2016-09-07 | Disposition: A | Discharge status: Home / Self Care | Payer: Medicare Other | Source: Ambulatory Visit | Attending: Radiation Oncology | Admitting: Radiation Oncology

## 2016-09-07 ENCOUNTER — Other Ambulatory Visit: Payer: Self-pay | Admitting: *Deleted

## 2016-09-07 VITALS — BP 133/60 | HR 80 | Temp 99.1°F | Resp 22 | Wt 119.5 lb

## 2016-09-07 DIAGNOSIS — Z87891 Personal history of nicotine dependence: Secondary | ICD-10-CM | POA: Insufficient documentation

## 2016-09-07 DIAGNOSIS — Z85118 Personal history of other malignant neoplasm of bronchus and lung: Secondary | ICD-10-CM

## 2016-09-07 DIAGNOSIS — C3411 Malignant neoplasm of upper lobe, right bronchus or lung: Secondary | ICD-10-CM | POA: Insufficient documentation

## 2016-09-07 NOTE — Progress Notes (Signed)
Radiation Oncology Follow up Note  Name: Bethany Lambert   Date:   09/07/2016 MRN:  [id number] DOB: May 07, 1934    This 82 y.o. female presents to the clinic today for one-month follow-up SB RT right upper lobe.  REFERRING PROVIDER: Leonel Ramsay, MD  HPI: Patient is a 82 year old female now while 1 month having completed SB RT to her right upper lobe for non-small cell lung cancer. She's also out 3/2 years having completed combined modality treatment for stage IIIa squamous cell carcinoma treated with concurrent chemotherapy and radiation. She seen today in routine follow-up is doing fair she said she has some significant congestion in her chest is taking some Mucinex for that. No significant productive cough although there is a chronic nonproductive cough. She is on continuous nasal oxygen wheelchair-bound..  COMPLICATIONS OF TREATMENT: none  FOLLOW UP COMPLIANCE: keeps appointments   PHYSICAL EXAM:  BP 133/60   Pulse 80   Temp 99.1 F (37.3 C)   Resp (!) 22   Wt 119 lb 7.8 oz (54.2 kg)   BMI 24.97 kg/m  Elderly wheelchair-bound female on nasal oxygen in NAD. Well-developed well-nourished patient in NAD. HEENT reveals PERLA, EOMI, discs not visualized.  Oral cavity is clear. No oral mucosal lesions are identified. Neck is clear without evidence of cervical or supraclavicular adenopathy. Lungs are clear to A&P. Cardiac examination is essentially unremarkable with regular rate and rhythm without murmur rub or thrill. Abdomen is benign with no organomegaly or masses noted. Motor sensory and DTR levels are equal and symmetric in the upper and lower extremities. Cranial nerves II through XII are grossly intact. Proprioception is intact. No peripheral adenopathy or edema is identified. No motor or sensory levels are noted. Crude visual fields are within normal range.  RADIOLOGY RESULTS: No current films for review  PLAN: At this time of asked her to see her PMD which she or he has  an appointment for next week to try to tune up some of her pulmonary functions. She may in fact need a course of steroid therapy and I'll let the PMD decide that. Her lungs are clear really having no significant side effects from her SB RT. We'll see her back in 3 months for follow-up and obtain a CT scan prior to that visit. Patient knows to call sooner with any concerns.  I would like to take this opportunity to thank you for allowing me to participate in the care of your patient.Armstead Peaks., MD

## 2016-09-09 ENCOUNTER — Emergency Department: Payer: Medicare Other

## 2016-09-09 ENCOUNTER — Encounter: Payer: Self-pay | Admitting: Emergency Medicine

## 2016-09-09 ENCOUNTER — Inpatient Hospital Stay
Admission: EM | Admit: 2016-09-09 | Discharge: 2016-09-13 | DRG: 190 | Disposition: A | Discharge status: Home Health | Payer: Medicare Other | Attending: Internal Medicine | Admitting: Internal Medicine

## 2016-09-09 DIAGNOSIS — Z90710 Acquired absence of both cervix and uterus: Secondary | ICD-10-CM | POA: Diagnosis not present

## 2016-09-09 DIAGNOSIS — I10 Essential (primary) hypertension: Secondary | ICD-10-CM | POA: Diagnosis present

## 2016-09-09 DIAGNOSIS — Z9221 Personal history of antineoplastic chemotherapy: Secondary | ICD-10-CM

## 2016-09-09 DIAGNOSIS — Z79899 Other long term (current) drug therapy: Secondary | ICD-10-CM | POA: Diagnosis not present

## 2016-09-09 DIAGNOSIS — Z923 Personal history of irradiation: Secondary | ICD-10-CM | POA: Diagnosis not present

## 2016-09-09 DIAGNOSIS — Z888 Allergy status to other drugs, medicaments and biological substances status: Secondary | ICD-10-CM

## 2016-09-09 DIAGNOSIS — C3412 Malignant neoplasm of upper lobe, left bronchus or lung: Secondary | ICD-10-CM | POA: Diagnosis present

## 2016-09-09 DIAGNOSIS — J441 Chronic obstructive pulmonary disease with (acute) exacerbation: Principal | ICD-10-CM | POA: Diagnosis present

## 2016-09-09 DIAGNOSIS — Z7951 Long term (current) use of inhaled steroids: Secondary | ICD-10-CM | POA: Diagnosis not present

## 2016-09-09 DIAGNOSIS — L409 Psoriasis, unspecified: Secondary | ICD-10-CM | POA: Diagnosis present

## 2016-09-09 DIAGNOSIS — Z881 Allergy status to other antibiotic agents status: Secondary | ICD-10-CM | POA: Diagnosis not present

## 2016-09-09 DIAGNOSIS — J9621 Acute and chronic respiratory failure with hypoxia: Secondary | ICD-10-CM | POA: Diagnosis present

## 2016-09-09 DIAGNOSIS — Z87891 Personal history of nicotine dependence: Secondary | ICD-10-CM | POA: Diagnosis not present

## 2016-09-09 DIAGNOSIS — K219 Gastro-esophageal reflux disease without esophagitis: Secondary | ICD-10-CM | POA: Diagnosis present

## 2016-09-09 DIAGNOSIS — Z9849 Cataract extraction status, unspecified eye: Secondary | ICD-10-CM | POA: Diagnosis not present

## 2016-09-09 DIAGNOSIS — R2689 Other abnormalities of gait and mobility: Secondary | ICD-10-CM

## 2016-09-09 HISTORY — DX: Psoriasis, unspecified: L40.9

## 2016-09-09 LAB — CBC WITH DIFFERENTIAL/PLATELET
BASOS ABS: 0.1 10*3/uL (ref 0–0.1)
Basophils Relative: 1 %
Eosinophils Absolute: 0.1 10*3/uL (ref 0–0.7)
Eosinophils Relative: 1 %
HEMATOCRIT: 32.5 % — AB (ref 35.0–47.0)
Hemoglobin: 11.1 g/dL — ABNORMAL LOW (ref 12.0–16.0)
LYMPHS ABS: 0.6 10*3/uL — AB (ref 1.0–3.6)
LYMPHS PCT: 6 %
MCH: 31.8 pg (ref 26.0–34.0)
MCHC: 34.3 g/dL (ref 32.0–36.0)
MCV: 92.7 fL (ref 80.0–100.0)
Monocytes Absolute: 0.6 10*3/uL (ref 0.2–0.9)
Monocytes Relative: 5 %
NEUTROS ABS: 9.6 10*3/uL — AB (ref 1.4–6.5)
Neutrophils Relative %: 87 %
Platelets: 167 10*3/uL (ref 150–440)
RBC: 3.5 MIL/uL — AB (ref 3.80–5.20)
RDW: 12.9 % (ref 11.5–14.5)
WBC: 11 10*3/uL (ref 3.6–11.0)

## 2016-09-09 LAB — COMPREHENSIVE METABOLIC PANEL
ALK PHOS: 70 U/L (ref 38–126)
ALT: 18 U/L (ref 14–54)
AST: 23 U/L (ref 15–41)
Albumin: 3.6 g/dL (ref 3.5–5.0)
Anion gap: 7 (ref 5–15)
BILIRUBIN TOTAL: 0.5 mg/dL (ref 0.3–1.2)
BUN: 11 mg/dL (ref 6–20)
CALCIUM: 8.7 mg/dL — AB (ref 8.9–10.3)
CO2: 28 mmol/L (ref 22–32)
CREATININE: 0.85 mg/dL (ref 0.44–1.00)
Chloride: 98 mmol/L — ABNORMAL LOW (ref 101–111)
GFR calc Af Amer: >60 mL/min (ref >60)
Glucose, Bld: 124 mg/dL — ABNORMAL HIGH (ref 65–99)
Potassium: 3.8 mmol/L (ref 3.5–5.1)
Sodium: 133 mmol/L — ABNORMAL LOW (ref 135–145)
TOTAL PROTEIN: 6.8 g/dL (ref 6.5–8.1)

## 2016-09-09 LAB — URINALYSIS COMPLETE WITH MICROSCOPIC (ARMC ONLY)
Bilirubin Urine: NEGATIVE
GLUCOSE, UA: NEGATIVE mg/dL
HGB URINE DIPSTICK: NEGATIVE
Nitrite: NEGATIVE
Protein, ur: 30 mg/dL — AB
SQUAMOUS EPITHELIAL / LPF: NONE SEEN
Specific Gravity, Urine: 1.012 (ref 1.005–1.030)
pH: 6 (ref 5.0–8.0)

## 2016-09-09 LAB — TROPONIN I

## 2016-09-09 LAB — BRAIN NATRIURETIC PEPTIDE: B Natriuretic Peptide: 116 pg/mL — ABNORMAL HIGH (ref 0.0–100.0)

## 2016-09-09 MED ORDER — PANTOPRAZOLE SODIUM 40 MG PO TBEC
40.0000 mg | DELAYED_RELEASE_TABLET | Freq: Two times a day (BID) | ORAL | Status: DC
  Administered 2016-09-10 – 2016-09-13 (×8): 40 mg via ORAL
  Filled 2016-09-09 (×8): qty 1

## 2016-09-09 MED ORDER — IPRATROPIUM-ALBUTEROL 0.5-2.5 (3) MG/3ML IN SOLN
3.0000 mL | RESPIRATORY_TRACT | Status: DC | PRN
Start: 2016-09-09 — End: 2016-09-13

## 2016-09-09 MED ORDER — AZITHROMYCIN 500 MG PO TABS
500.0000 mg | ORAL_TABLET | Freq: Every day | ORAL | Status: AC
  Administered 2016-09-10 – 2016-09-13 (×4): 500 mg via ORAL
  Filled 2016-09-09 (×4): qty 1

## 2016-09-09 MED ORDER — ACETAMINOPHEN 650 MG RE SUPP: 650.0000 mg | Freq: Four times a day (QID) | RECTAL | Status: DC | PRN

## 2016-09-09 MED ORDER — SODIUM CHLORIDE 0.9% FLUSH
3.0000 mL | Freq: Two times a day (BID) | INTRAVENOUS | Status: DC
  Administered 2016-09-10 – 2016-09-13 (×10): 3 mL via INTRAVENOUS

## 2016-09-09 MED ORDER — MOMETASONE FURO-FORMOTEROL FUM 100-5 MCG/ACT IN AERO
2.0000 | INHALATION_SPRAY | Freq: Two times a day (BID) | RESPIRATORY_TRACT | Status: DC
  Administered 2016-09-10 – 2016-09-13 (×8): 2 via RESPIRATORY_TRACT
  Filled 2016-09-09: qty 8.8

## 2016-09-09 MED ORDER — ONDANSETRON HCL 4 MG/2ML IJ SOLN: 4.0000 mg | Freq: Four times a day (QID) | INTRAMUSCULAR | Status: DC | PRN

## 2016-09-09 MED ORDER — BENZONATATE 100 MG PO CAPS
ORAL_CAPSULE | ORAL | Status: AC
  Filled 2016-09-09: qty 2

## 2016-09-09 MED ORDER — IPRATROPIUM-ALBUTEROL 0.5-2.5 (3) MG/3ML IN SOLN
3.0000 mL | Freq: Once | RESPIRATORY_TRACT | Status: AC
  Administered 2016-09-09: 3 mL via RESPIRATORY_TRACT
  Filled 2016-09-09: qty 3

## 2016-09-09 MED ORDER — ACETAMINOPHEN 325 MG PO TABS
650.0000 mg | ORAL_TABLET | Freq: Four times a day (QID) | ORAL | Status: DC | PRN
Start: 2016-09-09 — End: 2016-09-13

## 2016-09-09 MED ORDER — ENOXAPARIN SODIUM 40 MG/0.4ML ~~LOC~~ SOLN
40.0000 mg | SUBCUTANEOUS | Status: DC
  Administered 2016-09-10 – 2016-09-12 (×3): 40 mg via SUBCUTANEOUS
  Filled 2016-09-09 (×4): qty 0.4

## 2016-09-09 MED ORDER — TIOTROPIUM BROMIDE MONOHYDRATE 18 MCG IN CAPS
18.0000 ug | ORAL_CAPSULE | Freq: Every day | RESPIRATORY_TRACT | Status: DC
  Administered 2016-09-10 – 2016-09-13 (×4): 18 ug via RESPIRATORY_TRACT
  Filled 2016-09-09: qty 5

## 2016-09-09 MED ORDER — DEXTROSE 5 % IV SOLN
500.0000 mg | Freq: Once | INTRAVENOUS | Status: AC
  Administered 2016-09-09: 500 mg via INTRAVENOUS
  Filled 2016-09-09: qty 500

## 2016-09-09 MED ORDER — METOPROLOL TARTRATE 50 MG PO TABS
50.0000 mg | ORAL_TABLET | Freq: Two times a day (BID) | ORAL | Status: DC
  Administered 2016-09-10 – 2016-09-13 (×8): 50 mg via ORAL
  Filled 2016-09-09 (×8): qty 1

## 2016-09-09 MED ORDER — ONDANSETRON HCL 4 MG PO TABS: 4.0000 mg | ORAL_TABLET | Freq: Four times a day (QID) | ORAL | Status: DC | PRN

## 2016-09-09 MED ORDER — DILTIAZEM HCL ER COATED BEADS 120 MG PO CP24
120.0000 mg | ORAL_CAPSULE | Freq: Every day | ORAL | Status: DC
  Administered 2016-09-10 – 2016-09-13 (×4): 120 mg via ORAL
  Filled 2016-09-09 (×4): qty 1

## 2016-09-09 MED ORDER — METHYLPREDNISOLONE SODIUM SUCC 125 MG IJ SOLR
125.0000 mg | Freq: Once | INTRAMUSCULAR | Status: AC
  Administered 2016-09-09: 125 mg via INTRAVENOUS
  Filled 2016-09-09: qty 2

## 2016-09-09 MED ORDER — METHYLPREDNISOLONE SODIUM SUCC 125 MG IJ SOLR
60.0000 mg | Freq: Four times a day (QID) | INTRAMUSCULAR | Status: DC
  Administered 2016-09-10 – 2016-09-11 (×6): 60 mg via INTRAVENOUS
  Filled 2016-09-09 (×5): qty 2

## 2016-09-09 MED ORDER — BENZONATATE 100 MG PO CAPS
200.0000 mg | ORAL_CAPSULE | Freq: Three times a day (TID) | ORAL | Status: DC | PRN
  Administered 2016-09-09: 200 mg via ORAL

## 2016-09-09 NOTE — H&P (Signed)
[place] [facility] [place] NAME: Bethany Lambert    MR#:  [id number]  DATE OF BIRTH:  Jun 28, 1934  DATE OF ADMISSION:  09/09/2016  PRIMARY CARE PHYSICIAN: Leonel Ramsay, MD   REQUESTING/REFERRING PHYSICIAN: Edd Fabian, MD  CHIEF COMPLAINT:   Chief Complaint  Patient presents with  . Shortness of Breath    HISTORY OF PRESENT ILLNESS:  Bethany Lambert  is a 82 y.o. female who presents with Increased shortness of breath and fatigue for the past week. She states that she's also had increased cough as well as some wheezing. She has a known history of COPD. She also recently finished radiation treatment for lung mass. Here in the ED today she was found to have some signs of possible bronchitis on chest x-ray, but no overt pneumonia or edema or effusion. She has baseline oxygen use at home, but has required an increase in order to maintain good O2 saturations. Hospitals were called for admission for COPD exacerbation.  PAST MEDICAL HISTORY:   Past Medical History:  Diagnosis Date  . COPD (chronic obstructive pulmonary disease) ([place])   . Erythrocytosis   . GERD (gastroesophageal reflux disease)   . History of chemotherapy   . History of radiation therapy   . Hyperlipidemia   . Hypertension   . Kidney failure   . Lung cancer (Bethany Lambert)    squamous, stage 3 non small cell lung ca  . Polycythemia     PAST SURGICAL HISTORY:   Past Surgical History:  Procedure Laterality Date  . CATARACT EXTRACTION    . COLON RESECTION    . CT GUIDED BIOPSY  ([facility] HX)  11/19/2012   lung  . HERNIA REPAIR    . TOTAL ABDOMINAL HYSTERECTOMY      SOCIAL HISTORY:   Social History  Substance Use Topics  . Smoking status: Former Smoker    Packs/day: 2.00    Years: 40.00    Types: Cigarettes  . Smokeless tobacco: Never Used  . Alcohol use No    FAMILY HISTORY:   Family History  Problem Relation Age of Onset  . Heart attack Father   . Cancer Son      DRUG ALLERGIES:   Allergies  Allergen Reactions  . Ciprofloxacin   . Fluocinolone Other (See Comments)  . Norvasc [Amlodipine Besylate]     MEDICATIONS AT HOME:   Prior to Admission medications   Medication Sig Start Date End Date Taking? Authorizing Provider  albuterol (PROVENTIL HFA;VENTOLIN HFA) 108 (90 BASE) MCG/ACT inhaler Inhale 2 puffs into the lungs every 6 (six) hours as needed for wheezing.   Yes Historical Provider, MD  budesonide-formoterol (SYMBICORT) 80-4.5 MCG/ACT inhaler Inhale 2 puffs into the lungs as needed.    Yes Historical Provider, MD  calcium carbonate (OS-CAL) 1250 (500 Ca) MG chewable tablet Chew 1 tablet by mouth daily.   Yes Historical Provider, MD  Cyanocobalamin (VITAMIN B-12 IJ) Inject as directed every 30 (thirty) days.   Yes Historical Provider, MD  diltiazem (DILACOR XR) 120 MG 24 hr capsule Take 120 mg by mouth daily.   Yes Historical Provider, MD  docusate sodium (COLACE) 100 MG capsule Take 100 mg by mouth 2 (two) times daily.   Yes Historical Provider, MD  fluocinonide (LIDEX) 0.05 % external solution Apply 1 application topically 2 (two) times daily.  08/18/16  Yes Historical Provider, MD  guaiFENesin (MUCINEX) 600 MG 12 hr tablet Take 600 mg by mouth 2 (two) times daily.  Yes Historical Provider, MD  ipratropium-albuterol (DUONEB) 0.5-2.5 (3) MG/3ML SOLN Take 3 mLs by nebulization.   Yes Historical Provider, MD  metoprolol (LOPRESSOR) 50 MG tablet Take 1 tablet (50 mg total) by mouth 2 (two) times daily. 01/31/13  Yes Wellington Hampshire, MD  Multiple Vitamin (MULTIVITAMIN) tablet Take 1 tablet by mouth daily.   Yes Historical Provider, MD  omeprazole (PRILOSEC) 20 MG capsule Take 20 mg by mouth 2 (two) times daily before a meal. Takes 20 mg am and 40 mg pm daily.   Yes Historical Provider, MD  potassium chloride (K-DUR,KLOR-CON) 10 MEQ tablet Take 10 mEq by mouth daily.   Yes Historical Provider, MD  Prenatal Vit-Fe Fumarate-FA (PRENAVITE MULTIPLE  VITAMIN PO) Take by mouth daily. Reported on 01/27/2016   Yes Historical Provider, MD  tiotropium (SPIRIVA) 18 MCG inhalation capsule Place 18 mcg into inhaler and inhale daily.   Yes Historical Provider, MD  triamcinolone cream (KENALOG) 0.1 % Apply 1 application topically 2 (two) times daily.  06/16/16  Yes Historical Provider, MD    REVIEW OF SYSTEMS:  Review of Systems  Constitutional: Positive for malaise/fatigue. Negative for chills, fever and weight loss.  HENT: Negative for ear pain, hearing loss and tinnitus.   Eyes: Negative for blurred vision, double vision, pain and redness.  Respiratory: Positive for cough, shortness of breath and wheezing. Negative for hemoptysis.   Cardiovascular: Negative for chest pain, palpitations, orthopnea and leg swelling.  Gastrointestinal: Negative for abdominal pain, constipation, diarrhea, nausea and vomiting.  Genitourinary: Negative for dysuria, frequency and hematuria.  Musculoskeletal: Negative for back pain, joint pain and neck pain.  Skin:       No acne, rash, or lesions  Neurological: Negative for dizziness, tremors, focal weakness and weakness.  Endo/Heme/Allergies: Negative for polydipsia. Does not bruise/bleed easily.  Psychiatric/Behavioral: Negative for depression. The patient is not nervous/anxious and does not have insomnia.      VITAL SIGNS:   Vitals:   09/09/16 1848 09/09/16 1900 09/09/16 1930 09/09/16 2000  BP:  (!) 178/58 (!) 170/67 (!) 155/63  Pulse:  80 82 90  Resp:  (!) 33 (!) 34 (!) 21  Temp:      TempSrc:      SpO2: 99% 100% 100% 100%  Height:       Wt Readings from Last 3 Encounters:  09/07/16 54.2 kg (119 lb 7.8 oz)  07/06/16 55.9 kg (123 lb 5 oz)  06/30/16 55.7 kg (122 lb 11 oz)    PHYSICAL EXAMINATION:  Physical Exam  Vitals reviewed. Constitutional: She is oriented to person, place, and time. She appears well-developed and well-nourished. No distress.  HENT:  Head: Normocephalic and atraumatic.   Mouth/Throat: Oropharynx is clear and moist.  Eyes: Conjunctivae and EOM are normal. Pupils are equal, round, and reactive to light. No scleral icterus.  Neck: Normal range of motion. Neck supple. No JVD present. No thyromegaly present.  Cardiovascular: Normal rate, regular rhythm and intact distal pulses.  Exam reveals no gallop and no friction rub.   No murmur heard. Respiratory: Effort normal. No respiratory distress. She has wheezes (and coarse breath sounds bilaterally). She has no rales.  GI: Soft. Bowel sounds are normal. She exhibits no distension. There is no tenderness.  Musculoskeletal: Normal range of motion. She exhibits no edema.  No arthritis, no gout  Lymphadenopathy:    She has no cervical adenopathy.  Neurological: She is alert and oriented to person, place, and time. No cranial nerve deficit.  No  dysarthria, no aphasia  Skin: Skin is warm and dry. No rash noted. No erythema.  Psychiatric: She has a normal mood and affect. Her behavior is normal. Judgment and thought content normal.    LABORATORY PANEL:   CBC  Recent Labs Lab 09/09/16 1902  WBC 11.0  HGB 11.1*  HCT 32.5*  PLT 167   ------------------------------------------------------------------------------------------------------------------  Chemistries   Recent Labs Lab 09/09/16 1902  NA 133*  K 3.8  CL 98*  CO2 28  GLUCOSE 124*  BUN 11  CREATININE 0.85  CALCIUM 8.7*  AST 23  ALT 18  ALKPHOS 70  BILITOT 0.5   ------------------------------------------------------------------------------------------------------------------  Cardiac Enzymes  Recent Labs Lab 09/09/16 1902  TROPONINI <0.03   ------------------------------------------------------------------------------------------------------------------  RADIOLOGY:  Dg Chest Portable 1 View  Result Date: 09/09/2016 CLINICAL DATA:  Initial evaluation for the acute shortness of breath. History of COPD. EXAM: PORTABLE CHEST 1 VIEW  COMPARISON:  Prior CT from 06/03/2016. FINDINGS: Cardiac and mediastinal silhouettes are stable in size and contour, and remain within normal limits. Aortic atherosclerosis noted. Lungs are normally inflated. Post radiation changes present within the left suprahilar region. Underlying emphysema noted. Mildly increased peribronchial thickening within the lower lobes bilaterally, right greater than left, which may reflect superimposed bronchiolitis. No consolidative airspace disease. No pulmonary edema pleural effusion. No pneumothorax. Osseous structures unchanged. IMPRESSION: 1. Mildly increased peribronchial thickening within the bilateral lung bases as compared to previous, greater right greater than left. All findings are at least in part related underlying emphysematous changes, superimposed bronchiolitis may also be present. 2. No other active cardiopulmonary disease identified. 3. Underlying emphysema/COPD. 4. Stable post radiation changes within the left lung. Electronically Signed   By: Jeannine Boga M.D.   On: 09/09/2016 19:45    EKG:   Orders placed or performed during the hospital encounter of 09/09/16  . EKG 12-Lead  . EKG 12-Lead  . EKG 12-Lead  . EKG 12-Lead    IMPRESSION AND PLAN:  Principal Problem:   COPD exacerbation (HCC) - IV steroids, when necessary nebs, azithromycin, when necessary antitussive Active Problems:   Cancer of upper lobe of left lung ([place]) - patient just finished radiation therapy for the same. It is unclear whether this may play a role in her current exacerbation, though felt to be less likely.   HTN (hypertension) - continue home meds   GERD (gastroesophageal reflux disease) - home dose PPI  All the records are reviewed and case discussed with ED provider. Management plans discussed with the patient and/or family.  DVT PROPHYLAXIS: SubQ lovenox  GI PROPHYLAXIS: PPI  ADMISSION STATUS: Inpatient  CODE STATUS: Full Code Status History    This  patient does not have a recorded code status. Please follow your organizational policy for patients in this situation.      TOTAL TIME TAKING CARE OF THIS PATIENT: 45 minutes.    Layna Roeper FIELDING 09/09/2016, 8:41 PM  [facility]  Office  [phone]  CC: Primary care physician; Leonel Ramsay, MD

## 2016-09-09 NOTE — ED Provider Notes (Signed)
[facility] Emergency Department Provider Note   ____________________________________________   First MD Initiated Contact with Patient 09/09/16 1840     (approximate)  I have reviewed the triage vital signs and the nursing notes.   HISTORY  Chief Complaint Shortness of Breath    HPI Bethany Lambert is a 82 y.o. female history of COPD with chronic 2-3 L home oxygen requirement,GERD, status post completion of radiation therapy for lung mass 2 months ago presents for evaluation of one week of worsening shortness of breath, gradual onset, constant, worse with exertion, feels like prior COPD exacerbations. No chest pain. She has had productive cough but no fever. No vomiting or diarrhea. She has also had some dysuria.   Past Medical History:  Diagnosis Date  . COPD (chronic obstructive pulmonary disease) ([place])   . Erythrocytosis   . GERD (gastroesophageal reflux disease)   . History of chemotherapy   . History of radiation therapy   . Hyperlipidemia   . Hypertension   . Kidney failure   . Lung cancer ([place])    squamous, stage 3 non small cell lung ca  . Polycythemia     Patient Active Problem List   Diagnosis Date Noted  . Cancer of upper lobe of left lung ([place]) 07/06/2016  . Tachycardia 02/03/2013  . Dyspnea 02/03/2013    Past Surgical History:  Procedure Laterality Date  . CATARACT EXTRACTION    . COLON RESECTION    . CT GUIDED BIOPSY  ([facility] HX)  11/19/2012   lung  . HERNIA REPAIR    . TOTAL ABDOMINAL HYSTERECTOMY      Prior to Admission medications   Medication Sig Start Date End Date Taking? Authorizing Provider  albuterol (PROVENTIL HFA;VENTOLIN HFA) 108 (90 BASE) MCG/ACT inhaler Inhale 2 puffs into the lungs every 6 (six) hours as needed for wheezing.    Historical Provider, MD  budesonide-formoterol (SYMBICORT) 80-4.5 MCG/ACT inhaler Inhale 2 puffs into the lungs as needed.     Historical Provider, MD  calcium carbonate  (OS-CAL) 1250 (500 Ca) MG chewable tablet Chew 1 tablet by mouth daily.    Historical Provider, MD  Cyanocobalamin (VITAMIN B-12 IJ) Inject as directed every 30 (thirty) days.    Historical Provider, MD  diltiazem (DILACOR XR) 120 MG 24 hr capsule Take 120 mg by mouth daily.    Historical Provider, MD  fluocinonide (LIDEX) 0.05 % external solution  08/18/16   Historical Provider, MD  guaiFENesin (MUCINEX) 600 MG 12 hr tablet Take 600 mg by mouth 2 (two) times daily.    Historical Provider, MD  ipratropium-albuterol (DUONEB) 0.5-2.5 (3) MG/3ML SOLN Take 3 mLs by nebulization.    Historical Provider, MD  metoprolol (LOPRESSOR) 50 MG tablet Take 1 tablet (50 mg total) by mouth 2 (two) times daily. 01/31/13   Wellington Hampshire, MD  Multiple Vitamin (MULTIVITAMIN) tablet Take 1 tablet by mouth daily.    Historical Provider, MD  omeprazole (PRILOSEC) 20 MG capsule Takes 20 mg am and 40 mg pm daily.    Historical Provider, MD  potassium chloride (K-DUR,KLOR-CON) 10 MEQ tablet Take 10 mEq by mouth daily.    Historical Provider, MD  predniSONE (DELTASONE) 20 MG tablet Take 2 tablets (40 mg total) by mouth daily. Patient not taking: Reported on 09/07/2016 08/03/15   Carrie Mew, MD  Prenatal Vit-Fe Fumarate-FA (PRENAVITE MULTIPLE VITAMIN PO) Take by mouth daily. Reported on 01/27/2016    Historical Provider, MD  tiotropium (SPIRIVA) 18 MCG inhalation  capsule Place 18 mcg into inhaler and inhale daily.    Historical Provider, MD  triamcinolone cream (KENALOG) 0.1 %  06/16/16   Historical Provider, MD    Allergies Ciprofloxacin; Fluocinolone; and Norvasc [amlodipine besylate]  Family History  Problem Relation Age of Onset  . Heart attack Father   . Cancer Son     Social History Social History  Substance Use Topics  . Smoking status: Former Smoker    Packs/day: 2.00    Years: 40.00    Types: Cigarettes  . Smokeless tobacco: Never Used  . Alcohol use No    Review of Systems Constitutional: No  fever/chills Eyes: No visual changes. ENT: No sore throat. Cardiovascular: Denies chest pain. Respiratory: + shortness of breath. Gastrointestinal: No abdominal pain.  No nausea, no vomiting.  No diarrhea.  No constipation. Genitourinary: Negative for dysuria. Musculoskeletal: Negative for back pain. Skin: Negative for rash. Neurological: Negative for headaches, focal weakness or numbness.  10-point ROS otherwise negative.  ____________________________________________   PHYSICAL EXAM:  VITAL SIGNS: ED Triage Vitals  Enc Vitals Group     BP 09/09/16 1827 (!) 199/71     Pulse Rate 09/09/16 1827 92     Resp 09/09/16 1827 (!) 28     Temp 09/09/16 1827 99.3 F (37.4 C)     Temp Source 09/09/16 1827 Oral     SpO2 09/09/16 1827 98 %     Weight --      Height 09/09/16 1829 '4\' 10"'$  (1.473 m)     Head Circumference --      Peak Flow --      Pain Score --      Pain Loc --      Pain Edu? --      Excl. in [place]? --     Constitutional: Alert and oriented. In mild to moderate respiratory distress but able to speak in short sentences. Eyes: Conjunctivae are normal. PERRL. EOMI. Head: Atraumatic. Nose: No congestion/rhinnorhea. Mouth/Throat: Mucous membranes are moist.  Oropharynx non-erythematous. Neck: No stridor.  Supple without meningismus. Cardiovascular: Normal rate, regular rhythm. Grossly normal heart sounds.  Good peripheral circulation. Respiratory: Tachypnea with increased work of breathing, diffuse expiratory wheeze, moderate air movement, prolonged expiratory phase. Gastrointestinal: Soft and nontender. No distention. No CVA tenderness. Genitourinary: Deferred Musculoskeletal: No lower extremity tenderness nor edema.  No joint effusions. Neurologic:  Normal speech and language. No gross focal neurologic deficits are appreciated.  Skin:  Skin is warm, dry and intact. No rash noted. Psychiatric: Mood and affect are normal. Speech and behavior are  normal.  ____________________________________________   LABS (all labs ordered are listed, but only abnormal results are displayed)  Labs Reviewed  CBC WITH DIFFERENTIAL/PLATELET - Abnormal; Notable for the following:       Result Value   RBC 3.50 (*)    Hemoglobin 11.1 (*)    HCT 32.5 (*)    Neutro Abs 9.6 (*)    Lymphs Abs 0.6 (*)    All other components within normal limits  COMPREHENSIVE METABOLIC PANEL - Abnormal; Notable for the following:    Sodium 133 (*)    Chloride 98 (*)    Glucose, Bld 124 (*)    Calcium 8.7 (*)    All other components within normal limits  BRAIN NATRIURETIC PEPTIDE - Abnormal; Notable for the following:    B Natriuretic Peptide 116.0 (*)    All other components within normal limits  CULTURE, BLOOD (ROUTINE X 2)  CULTURE, BLOOD (ROUTINE  X 2)  TROPONIN I  URINALYSIS COMPLETEWITH MICROSCOPIC ([facility] ONLY)   ____________________________________________  EKG  ED ECG REPORT I, Joanne Gavel, the attending physician, personally viewed and interpreted this ECG.   Date: 09/09/2016  EKG Time: 08:34  Rate: 81  Rhythm: normal sinus rhythm  Axis: normal  Intervals:none  ST&T Change: No acute ST elevation or acute ST depression.  ____________________________________________  RADIOLOGY  CXR IMPRESSION:  1. Mildly increased peribronchial thickening within the bilateral  lung bases as compared to previous, greater right greater than left.  All findings are at least in part related underlying emphysematous  changes, superimposed bronchiolitis may also be present.  2. No other active cardiopulmonary disease identified.  3. Underlying emphysema/COPD.  4. Stable post radiation changes within the left lung.      ____________________________________________   PROCEDURES  Procedure(s) performed: None  Procedures  Critical Care performed: None  ____________________________________________   INITIAL IMPRESSION / ASSESSMENT AND PLAN / ED  COURSE  Pertinent labs & imaging results that were available during my care of the patient were reviewed by me and considered in my medical decision making (see chart for details).  Bethany Lambert is a 82 y.o. female history of COPD with chronic 2-3 L home oxygen requirement,GERD, status post completion of radiation therapy for lung mass 2 months ago presents for evaluation of one week of worsening shortness of breath. On exam, she is in mild to moderate respiratory distress with diffuse expiratory wheeze, tachypnea, prolonged expiratory phase concerning for acute COPD exacerbation. We will treat her symptomatically with multiple DuoNeb nebs as well as IV Solu-Medrol,, obtain screening labs, chest x-ray and reassess for disposition.  ----------------------------------------- 8:17 PM on 09/09/2016 ----------------------------------------- After 3 DuoNeb treatments as well as Solu-Medrol, patient has mild improvement in her wheezes and air movement but is still tachypneic with increased work of breathing. She becomes quite winded even with just sitting forward. Her labs are generally reassuring, troponin negative. Chest x-ray shows no acute cardiopulmonary abnormality however given her persistent increased work of breathing, I will discuss the case with the hospitalist for admission. Blood cultures and azithromycin ordered.  Clinical Course     ____________________________________________   FINAL CLINICAL IMPRESSION(S) / ED DIAGNOSES  Final diagnoses:  COPD with acute exacerbation ([place])      NEW MEDICATIONS STARTED DURING THIS VISIT:  New Prescriptions   No medications on file     Note:  This document was prepared using Dragon voice recognition software and may include unintentional dictation errors.    Joanne Gavel, MD 09/09/16 2018

## 2016-09-09 NOTE — ED Triage Notes (Signed)
Pt here for SOB. Hx COPD.  Reports feeling poorly all week.

## 2016-09-10 LAB — CBC
HCT: 31.5 % — ABNORMAL LOW (ref 35.0–47.0)
HEMOGLOBIN: 11.2 g/dL — AB (ref 12.0–16.0)
MCH: 32.4 pg (ref 26.0–34.0)
MCHC: 35.4 g/dL (ref 32.0–36.0)
MCV: 91.5 fL (ref 80.0–100.0)
PLATELETS: 169 10*3/uL (ref 150–440)
RBC: 3.45 MIL/uL — AB (ref 3.80–5.20)
RDW: 13 % (ref 11.5–14.5)
WBC: 10.2 10*3/uL (ref 3.6–11.0)

## 2016-09-10 LAB — BASIC METABOLIC PANEL
ANION GAP: 9 (ref 5–15)
BUN: 11 mg/dL (ref 6–20)
CALCIUM: 8.8 mg/dL — AB (ref 8.9–10.3)
CO2: 28 mmol/L (ref 22–32)
CREATININE: 0.9 mg/dL (ref 0.44–1.00)
Chloride: 96 mmol/L — ABNORMAL LOW (ref 101–111)
GFR, EST NON AFRICAN AMERICAN: 59 mL/min — AB (ref >60)
GLUCOSE: 159 mg/dL — AB (ref 65–99)
Potassium: 3.7 mmol/L (ref 3.5–5.1)
Sodium: 133 mmol/L — ABNORMAL LOW (ref 135–145)

## 2016-09-10 MED ORDER — ORAL CARE MOUTH RINSE
15.0000 mL | Freq: Two times a day (BID) | OROMUCOSAL | Status: DC
  Administered 2016-09-10 – 2016-09-12 (×6): 15 mL via OROMUCOSAL

## 2016-09-10 MED ORDER — CYCLOBENZAPRINE HCL 10 MG PO TABS
5.0000 mg | ORAL_TABLET | Freq: Three times a day (TID) | ORAL | Status: DC | PRN
  Administered 2016-09-10 (×2): 5 mg via ORAL
  Filled 2016-09-10 (×2): qty 1

## 2016-09-10 NOTE — Progress Notes (Signed)
Pt awake and very fridgety; picking at telemetry leads. She ask nurse to give her a "pair of scissors to cut this string" (which was the telemetry lead). Reoriented and assisted pt with repositioning. Consider mild disorientation related to flexeril.  Sat up for meal. Reorinnted, reassursed, bed alarm activated for safety.

## 2016-09-10 NOTE — Plan of Care (Signed)
Problem: Education: Goal: Knowledge of [facility] Education information/materials will improve Outcome: Progressing Pt oriented to unit, including call bell, meal ordering.  Pt reports improved work of breathing.   Denies pain.  Bed in low position, call bell within reach.  WCTM.  Problem: Activity: Goal: Risk for activity intolerance will decrease Outcome: Not Progressing DOE d/t COPD exacerbation.  Comments: DOE d/t COPD exacerbation.

## 2016-09-10 NOTE — Progress Notes (Signed)
[place] [facility] [place] NAME: Bethany Lambert    MR#:  [id number]  DATE OF BIRTH:  Oct 01, 1934  SUBJECTIVE:  CHIEF COMPLAINT:   Chief Complaint  Patient presents with  . Shortness of Breath    recent radiation for lung mass, came with COPD, now better. Still some wheezing.  REVIEW OF SYSTEMS:  CONSTITUTIONAL: No fever, fatigue or weakness.  EYES: No blurred or double vision.  EARS, NOSE, AND THROAT: No tinnitus or ear pain.  RESPIRATORY: No cough, some shortness of breath, wheezing or hemoptysis.  CARDIOVASCULAR: No chest pain, orthopnea, edema.  GASTROINTESTINAL: No nausea, vomiting, diarrhea or abdominal pain.  GENITOURINARY: No dysuria, hematuria.  ENDOCRINE: No polyuria, nocturia,  HEMATOLOGY: No anemia, easy bruising or bleeding SKIN: No rash or lesion. MUSCULOSKELETAL: No joint pain or arthritis.   NEUROLOGIC: No tingling, numbness, weakness.  PSYCHIATRY: No anxiety or depression.   ROS  DRUG ALLERGIES:   Allergies  Allergen Reactions  . Ciprofloxacin   . Fluocinolone Other (See Comments)  . Norvasc [Amlodipine Besylate]     VITALS:  Blood pressure (!) 166/61, pulse 80, temperature 97.5 F (36.4 C), temperature source Oral, resp. rate (!) 25, height '4\' 10"'$  (1.473 m), weight 54.4 kg (120 lb), SpO2 100 %.  PHYSICAL EXAMINATION:  GENERAL:  82 y.o.-year-old patient lying in the bed with no acute distress.  EYES: Pupils equal, round, reactive to light and accommodation. No scleral icterus. Extraocular muscles intact.  HEENT: Head atraumatic, normocephalic. Oropharynx and nasopharynx clear.  NECK:  Supple, no jugular venous distention. No thyroid enlargement, no tenderness.  LUNGS: Normal breath sounds bilaterally, mild wheezing, no crepitation. No use of accessory muscles of respiration.  CARDIOVASCULAR: S1, S2 normal. No murmurs, rubs, or gallops.  ABDOMEN: Soft, nontender, nondistended. Bowel sounds present. No organomegaly  or mass.  EXTREMITIES: No pedal edema, cyanosis, or clubbing.  NEUROLOGIC: Cranial nerves II through XII are intact. Muscle strength 5/5 in all extremities. Sensation intact. Gait not checked.  PSYCHIATRIC: The patient is alert and oriented x 3.  SKIN: No obvious rash, lesion, or ulcer.   Physical Exam LABORATORY PANEL:   CBC  Recent Labs Lab 09/10/16 0426  WBC 10.2  HGB 11.2*  HCT 31.5*  PLT 169   ------------------------------------------------------------------------------------------------------------------  Chemistries   Recent Labs Lab 09/09/16 1902 09/10/16 0426  NA 133* 133*  K 3.8 3.7  CL 98* 96*  CO2 28 28  GLUCOSE 124* 159*  BUN 11 11  CREATININE 0.85 0.90  CALCIUM 8.7* 8.8*  AST 23  --   ALT 18  --   ALKPHOS 70  --   BILITOT 0.5  --    ------------------------------------------------------------------------------------------------------------------  Cardiac Enzymes  Recent Labs Lab 09/09/16 1902  TROPONINI <0.03   ------------------------------------------------------------------------------------------------------------------  RADIOLOGY:  Dg Chest Portable 1 View  Result Date: 09/09/2016 CLINICAL DATA:  Initial evaluation for the acute shortness of breath. History of COPD. EXAM: PORTABLE CHEST 1 VIEW COMPARISON:  Prior CT from 06/03/2016. FINDINGS: Cardiac and mediastinal silhouettes are stable in size and contour, and remain within normal limits. Aortic atherosclerosis noted. Lungs are normally inflated. Post radiation changes present within the left suprahilar region. Underlying emphysema noted. Mildly increased peribronchial thickening within the lower lobes bilaterally, right greater than left, which may reflect superimposed bronchiolitis. No consolidative airspace disease. No pulmonary edema pleural effusion. No pneumothorax. Osseous structures unchanged. IMPRESSION: 1. Mildly increased peribronchial thickening within the bilateral lung bases as  compared to previous, greater right greater  than left. All findings are at least in part related underlying emphysematous changes, superimposed bronchiolitis may also be present. 2. No other active cardiopulmonary disease identified. 3. Underlying emphysema/COPD. 4. Stable post radiation changes within the left lung. Electronically Signed   By: Jeannine Boga M.D.   On: 09/09/2016 19:45    ASSESSMENT AND PLAN:   Principal Problem:   COPD exacerbation ([place]) Active Problems:   Cancer of upper lobe of left lung ([facility])   GERD (gastroesophageal reflux disease)   HTN (hypertension)  * COPD exacerbation ([facility]) - IV steroids,  nebs, azithromycin, when necessary antitussive  * Cancer of upper lobe of left lung ([place]) - patient just finished radiation therapy for the same. *  HTN (hypertension) - continue home meds *  GERD (gastroesophageal reflux disease) - home dose PPI  All the records are reviewed and case discussed with ED provider. Management plans discussed with the patient and/or family.  DVT PROPHYLAXIS: SubQ lovenox  GI PROPHYLAXIS: PPI   All the records are reviewed and case discussed with Care Management/Social Workerr. Management plans discussed with the patient, family and they are in agreement.  CODE STATUS: Full  TOTAL TIME TAKING CARE OF THIS PATIENT: 35 minutes.     POSSIBLE D/C IN 1-2 DAYS, DEPENDING ON CLINICAL CONDITION.   Vaughan Basta M.D on 09/10/2016   Between 7am to 6pm - Pager - [phone]  After 6pm go to [email or web address] - password [email or web address] [facility]  Office  [phone]  CC: Primary care physician; FITZGERALD, DAVID Mamie Nick, MD  Note: This dictation was prepared with Dragon dictation along with smaller phrase technology. Any transcriptional errors that result from this process are unintentional.

## 2016-09-10 NOTE — Progress Notes (Signed)
DOE with recovery with minimal rescovery in approximately 5 minutes after using BSC with SB+. Flexeril 5 mg po started for right neck muscle spasms. Home meds given to dgt to take home per pt request.

## 2016-09-11 MED ORDER — METHYLPREDNISOLONE SODIUM SUCC 125 MG IJ SOLR
60.0000 mg | Freq: Two times a day (BID) | INTRAMUSCULAR | Status: DC
  Administered 2016-09-11 – 2016-09-12 (×2): 60 mg via INTRAVENOUS
  Filled 2016-09-11 (×2): qty 2

## 2016-09-11 NOTE — Progress Notes (Signed)
[place] at [place] NAME: Bethany Lambert    MR#:  [id number]  DATE OF BIRTH:  May 25, 1934  SUBJECTIVE:  CHIEF COMPLAINT:   Chief Complaint  Patient presents with  . Shortness of Breath    recent radiation for lung mass, came with COPD, now better. Still some wheezing on minimal exertion.   She may need to arrange for home o2 this time.  REVIEW OF SYSTEMS:  CONSTITUTIONAL: No fever, fatigue or weakness.  EYES: No blurred or double vision.  EARS, NOSE, AND THROAT: No tinnitus or ear pain.  RESPIRATORY: No cough, some shortness of breath, wheezing or hemoptysis.  CARDIOVASCULAR: No chest pain, orthopnea, edema.  GASTROINTESTINAL: No nausea, vomiting, diarrhea or abdominal pain.  GENITOURINARY: No dysuria, hematuria.  ENDOCRINE: No polyuria, nocturia,  HEMATOLOGY: No anemia, easy bruising or bleeding SKIN: No rash or lesion. MUSCULOSKELETAL: No joint pain or arthritis.   NEUROLOGIC: No tingling, numbness, weakness.  PSYCHIATRY: No anxiety or depression.   ROS  DRUG ALLERGIES:   Allergies  Allergen Reactions  . Ciprofloxacin   . Fluocinolone Other (See Comments)  . Norvasc [Amlodipine Besylate]     VITALS:  Blood pressure (!) 170/64, pulse 82, temperature 98.4 F (36.9 C), temperature source Oral, resp. rate 20, height '4\' 10"'$  (1.473 m), weight 54.4 kg (120 lb), SpO2 98 %.  PHYSICAL EXAMINATION:  GENERAL:  82 y.o.-year-old patient lying in the bed with no acute distress.  EYES: Pupils equal, round, reactive to light and accommodation. No scleral icterus. Extraocular muscles intact.  HEENT: Head atraumatic, normocephalic. Oropharynx and nasopharynx clear.  NECK:  Supple, no jugular venous distention. No thyroid enlargement, no tenderness.  LUNGS: Normal breath sounds bilaterally, mild wheezing, no crepitation. No use of accessory muscles of respiration.  CARDIOVASCULAR: S1, S2 normal. No murmurs, rubs, or gallops.  ABDOMEN:  Soft, nontender, nondistended. Bowel sounds present. No organomegaly or mass.  EXTREMITIES: No pedal edema, cyanosis, or clubbing.  NEUROLOGIC: Cranial nerves II through XII are intact. Muscle strength 5/5 in all extremities. Sensation intact. Gait not checked.  PSYCHIATRIC: The patient is alert and oriented x 3.  SKIN: No obvious rash, lesion, or ulcer.   Physical Exam LABORATORY PANEL:   CBC  Recent Labs Lab 09/10/16 0426  WBC 10.2  HGB 11.2*  HCT 31.5*  PLT 169   ------------------------------------------------------------------------------------------------------------------  Chemistries   Recent Labs Lab 09/09/16 1902 09/10/16 0426  NA 133* 133*  K 3.8 3.7  CL 98* 96*  CO2 28 28  GLUCOSE 124* 159*  BUN 11 11  CREATININE 0.85 0.90  CALCIUM 8.7* 8.8*  AST 23  --   ALT 18  --   ALKPHOS 70  --   BILITOT 0.5  --    ------------------------------------------------------------------------------------------------------------------  Cardiac Enzymes  Recent Labs Lab 09/09/16 1902  TROPONINI <0.03   ------------------------------------------------------------------------------------------------------------------  RADIOLOGY:  Dg Chest Portable 1 View  Result Date: 09/09/2016 CLINICAL DATA:  Initial evaluation for the acute shortness of breath. History of COPD. EXAM: PORTABLE CHEST 1 VIEW COMPARISON:  Prior CT from 06/03/2016. FINDINGS: Cardiac and mediastinal silhouettes are stable in size and contour, and remain within normal limits. Aortic atherosclerosis noted. Lungs are normally inflated. Post radiation changes present within the left suprahilar region. Underlying emphysema noted. Mildly increased peribronchial thickening within the lower lobes bilaterally, right greater than left, which may reflect superimposed bronchiolitis. No consolidative airspace disease. No pulmonary edema pleural effusion. No pneumothorax. Osseous structures unchanged. IMPRESSION: 1. Mildly  increased  peribronchial thickening within the bilateral lung bases as compared to previous, greater right greater than left. All findings are at least in part related underlying emphysematous changes, superimposed bronchiolitis may also be present. 2. No other active cardiopulmonary disease identified. 3. Underlying emphysema/COPD. 4. Stable post radiation changes within the left lung. Electronically Signed   By: Jeannine Boga M.D.   On: 09/09/2016 19:45    ASSESSMENT AND PLAN:   Principal Problem:   COPD exacerbation ([place]) Active Problems:   Cancer of upper lobe of left lung ([facility])   GERD (gastroesophageal reflux disease)   HTN (hypertension)  * COPD exacerbation ([facility]) - IV steroids,  nebs, azithromycin, when necessary antitussive.   May need home O2. Will get PT eval.   Still have significant wheezing and hypoxia on minimal exertion to batheroom.  * Cancer of upper lobe of left lung ([place]) - patient just finished radiation therapy for the same. *  HTN (hypertension) - continue home meds *  GERD (gastroesophageal reflux disease) - home dose PPI  All the records are reviewed and case discussed with ED provider. Management plans discussed with the patient and/or family.  DVT PROPHYLAXIS: SubQ lovenox  GI PROPHYLAXIS: PPI   All the records are reviewed and case discussed with Care Management/Social Workerr. Management plans discussed with the patient, family and they are in agreement.  CODE STATUS: Full  TOTAL TIME TAKING CARE OF THIS PATIENT: 35 minutes.     POSSIBLE D/C IN 1-2 DAYS, DEPENDING ON CLINICAL CONDITION.   Vaughan Basta M.D on 09/11/2016   Between 7am to 6pm - Pager - [phone]  After 6pm go to [email or web address] - password [email or web address] [facility]  Office  [phone]  CC: Primary care physician; FITZGERALD, DAVID Mamie Nick, MD  Note: This dictation was prepared with Dragon dictation along with smaller phrase technology. Any  transcriptional errors that result from this process are unintentional.

## 2016-09-11 NOTE — Progress Notes (Signed)
Initial Nutrition Assessment  DOCUMENTATION CODES:   Not applicable  INTERVENTION:  -Magic cup TID with meals, each supplement provides 290 kcal and 9 grams of protein  NUTRITION DIAGNOSIS:   Increased nutrient needs related to chronic illness as evidenced by estimated needs.  GOAL:   Patient will meet greater than or equal to 90% of their needs  MONITOR:   PO intake, Supplement acceptance, I & O's, Labs, Weight trends  REASON FOR ASSESSMENT:   Malnutrition Screening Tool    ASSESSMENT:   Bethany Lambert  is a 82 y.o. female who presents with Increased shortness of breath and fatigue for the past week. She states that she's also had increased cough as well as some wheezing. She has a known history of COPD.  Spoke with Bethany Lambert, family at bedside. She endorses good PO when she isn't feeling sick. Recently, her PO has been poor with copd exacerbation. States she ate 1/3 of her lunch tray today. States at one point her wt got up to the 140s while on chemo/radiation, now back down to 120# which she states is a comfortable weight for her. She also struggles with tough foods, tries to order soft foods w/ gravy. Will provide extra sauce/gravies at meals. Nutrition-Focused physical exam completed. Findings are mild-moderate fat depletion, moderate muscle depletion, and no edema.  Labs and medications reviewed.   Diet Order:  Diet Heart Room service appropriate? Yes; Fluid consistency: Thin  Skin:  Reviewed, no issues  Last BM:  10/7  Height:   Ht Readings from Last 1 Encounters:  09/09/16 '4\' 10"'$  (1.473 m)    Weight:   Wt Readings from Last 1 Encounters:  09/09/16 120 lb (54.4 kg)    Ideal Body Weight:  40.9 kg  BMI:  Body mass index is 25.08 kg/m.  Estimated Nutritional Needs:   Kcal:  1600-1900 calories  Protein:  65-81 gm  Fluid:  >/= 1.6L  EDUCATION NEEDS:   No education needs identified at this time  Satira Anis. Nozomi Mettler, MS, RD LDN Inpatient Clinical  Dietitian Pager [phone]

## 2016-09-11 NOTE — Plan of Care (Signed)
Problem: Education: Goal: Knowledge of [facility] Education information/materials will improve Outcome: Progressing Continue dyspnea, pursed lip breathing.  Pt does report improvement compared to status on admission.  Reported neck tightness, received PRN Flexeril.  No other complaints.  Bed in low position, call bell within reach.  WCTM.  Problem: Activity: Goal: Risk for activity intolerance will decrease Outcome: Not Progressing DOE, pursed lip breathing at rest.  Pt reports improved work of breathing.

## 2016-09-11 NOTE — Care Management Important Message (Signed)
Important Message  Patient Details  Name: Bethany Lambert MRN: [id number] Date of Birth: Sep 12, 1934   Medicare Important Message Given:  Yes    Rishaan Gunner A, RN 09/11/2016, 12:55 PM

## 2016-09-11 NOTE — Progress Notes (Signed)
Remains very DOE which resolves with rest when up to [facility]. Appetite improved. Alert,oriented, appropriate today. Telemetry discontinued.

## 2016-09-12 MED ORDER — METHYLPREDNISOLONE SODIUM SUCC 125 MG IJ SOLR
60.0000 mg | INTRAMUSCULAR | Status: DC
  Administered 2016-09-13: 09:00:00 60 mg via INTRAVENOUS
  Filled 2016-09-12: qty 2

## 2016-09-12 NOTE — Progress Notes (Signed)
CSW aware that PT recommended SNF placement for patient. CSW will continue to follow and assist.  Ernest Pine, MSW, LCSW, [place] Social Worker [phone]

## 2016-09-12 NOTE — Evaluation (Signed)
Physical Therapy Evaluation Patient Details Name: Bethany Lambert MRN: [id number] DOB: 1934-11-26 Today's Date: 09/12/2016   History of Present Illness  Pt. presented to ED with SOB, hx. COPD, L lung CA, HTN. Wears 2L O2 at baseline  Clinical Impression  Pt. Supine in bed upon arrival, alert and oriented. Pt. Demonstrates grossly 5/5 strength BUE/BLE. She is able to perform bed mobility mod I. Requires CGA for sit<>stand transfers with RW demonstrating safe technique. Pt. Was able to perform approx. 76f. Of gait CGA with RW demonstrates slowed cadence, even step length/step through pattern. Ambulation distance limited by SOB. Unsafe to perform further gait distance and stairs during today's session due to pt.'s decreased endurance. This is a significant change from the pt's baseline of being able to perform all tasks such as grocery shopping independently prior to admission. She performed the 5xSTS stand test in 16.58 seconds classifying her as an increased risk of falling. Although pt. Reports SOB and dizziness following short bouts of activity spO2 remained >93% throughout today's session. Recommend use of RW for all mobility at this time. Would benefit from skilled PT to address above deficits and promote optimal return to PLOF Recommend SNF placement upon d/c to follow up with further skilled PT needs.     Follow Up Recommendations SNF    Equipment Recommendations  Rolling walker with 5" wheels    Recommendations for Other Services       Precautions / Restrictions Precautions Precautions: Fall Restrictions Weight Bearing Restrictions: No      Mobility  Bed Mobility Overal bed mobility: Modified Independent             General bed mobility comments: Pt. able to perform supine<>sit in both directions without assist.   Transfers Overall transfer level: Needs assistance Equipment used: Rolling walker (2 wheeled) Transfers: Sit to/from Stand Sit to Stand: Min guard         General transfer comment: Pt. demonstrates good safety awareness performing sit<>stand transfers with safe technique. Slow movement transitions  Ambulation/Gait Ambulation/Gait assistance: Min guard Ambulation Distance (Feet): 30 Feet Assistive device: Rolling walker (2 wheeled)       General Gait Details: Pt. demonstrates slowed cadence, even step length step through pattern, requires RW for B UE assist, requires rest breaks after short distances.   Stairs            Wheelchair Mobility    Modified Rankin (Stroke Patients Only)       Balance Overall balance assessment: Needs assistance Sitting-balance support: Feet supported Sitting balance-Leahy Scale: Good     Standing balance support: Bilateral upper extremity supported Standing balance-Leahy Scale: Good Standing balance comment: Pt. able to perform static standing without BUE support but requires BUE support for dynamic standing activities.                              Pertinent Vitals/Pain Pain Assessment: No/denies pain    Home Living Family/patient expects to be discharged to:: Private residence Living Arrangements: Children (Pt's son lives with her, however he is rarely home due to work schedule) Available Help at Discharge: Family Type of Home: House Home Access: Stairs to enter Entrance Stairs-Rails: None ETechnical brewerof Steps: 2 Home Layout: One level Home Equipment: WEnvironmental consultant- 2 wheels      Prior Function Level of Independence: Independent with assistive device(s)         Comments: Pt. on 2L O2 at baseline, owns  RW and uses intermittently when feels necessary      Hand Dominance        Extremity/Trunk Assessment   Upper Extremity Assessment: Overall WFL for tasks assessed           Lower Extremity Assessment: Overall WFL for tasks assessed (B DF limited to neutral, pt. wears platform heeled shoes for all mobility at baseline. )         Communication    Communication: No difficulties  Cognition Arousal/Alertness: Awake/alert Behavior During Therapy: WFL for tasks assessed/performed Overall Cognitive Status: Within Functional Limits for tasks assessed                      General Comments      Exercises Other Exercises Other Exercises: 5x STS =16.58 seconds demonstrating decreased functional LE strength and indicating that she is at risk for falls. Pt. performed marching in sitting and standing x10B, UE PNF movements x10B.    Assessment/Plan    PT Assessment Patient needs continued PT services  PT Problem List Decreased balance;Decreased mobility;Decreased activity tolerance;Decreased knowledge of use of DME;Decreased range of motion          PT Treatment Interventions DME instruction;Gait training;Stair training;Therapeutic exercise;Therapeutic activities;Functional mobility training;Balance training;Neuromuscular re-education;Patient/family education    PT Goals (Current goals can be found in the Care Plan section)  Acute Rehab PT Goals Patient Stated Goal: Pt. would like to improve endurance to return to baseline function PT Goal Formulation: With patient Time For Goal Achievement: 09/26/16 Potential to Achieve Goals: Good    Frequency Min 2X/week   Barriers to discharge Decreased caregiver support Pt. does not have access to 24/7 assist.     Co-evaluation               End of Session Equipment Utilized During Treatment: Gait belt;Oxygen Activity Tolerance: Patient limited by fatigue;Patient tolerated treatment well (Pt. agreeable to all activities, limited by SOB.) Patient left: in chair;with call bell/phone within reach;with chair alarm set;with family/visitor present           Time: 1610-9604 PT Time Calculation (min) (ACUTE ONLY): 34 min   Charges:         PT G Codes:         Melanie Crazier, SPT  09/12/16,4:43 PM

## 2016-09-12 NOTE — Progress Notes (Signed)
[facility] [place] NAME: Bethany Lambert    MRN#:  [id number]  DATE OF BIRTH:  12/20/1933  SUBJECTIVE:  Hospital Day: 3 days Bethany Lambert is a 82 y.o. female presenting with Shortness of Breath .   Overnight events: No overnight events Interval Events: Continued complaints of shortness breath especially with activity  REVIEW OF SYSTEMS:  CONSTITUTIONAL: No fever, fatigue or weakness.  EYES: No blurred or double vision.  EARS, NOSE, AND THROAT: No tinnitus or ear pain.  RESPIRATORY: No cough, Positive shortness of breath, denies wheezing or hemoptysis.  CARDIOVASCULAR: No chest pain, orthopnea, edema.  GASTROINTESTINAL: No nausea, vomiting, diarrhea or abdominal pain.  GENITOURINARY: No dysuria, hematuria.  ENDOCRINE: No polyuria, nocturia,  HEMATOLOGY: No anemia, easy bruising or bleeding SKIN: No rash or lesion. MUSCULOSKELETAL: No joint pain or arthritis.   NEUROLOGIC: No tingling, numbness, weakness.  PSYCHIATRY: No anxiety or depression.   DRUG ALLERGIES:   Allergies  Allergen Reactions  . Ciprofloxacin   . Fluocinolone Other (See Comments)  . Norvasc [Amlodipine Besylate]     VITALS:  Blood pressure (!) 152/91, pulse 86, temperature 98.5 F (36.9 C), temperature source Oral, resp. rate 18, height '4\' 10"'$  (1.473 m), weight 54.4 kg (120 lb), SpO2 96 %.  PHYSICAL EXAMINATION:  VITAL SIGNS: Vitals:   09/12/16 0537 09/12/16 0812  BP: (!) 171/61 (!) 152/91  Pulse: 61 86  Resp: 20 18  Temp: 98.5 F (36.9 C)    GENERAL:82 y.o.female currently in no acute distress.  HEAD: Normocephalic, atraumatic.  EYES: Pupils equal, round, reactive to light. Extraocular muscles intact. No scleral icterus.  MOUTH: Moist mucosal membrane. Dentition intact. No abscess noted.  EAR, NOSE, THROAT: Clear without exudates. No external lesions.  NECK: Supple. No thyromegaly. No nodules. No JVD.  PULMONARY: Greatly diminished  breath sounds without wheeze rails or rhonci. No use of accessory muscles, Good respiratory effort. good air entry bilaterally CHEST: Nontender to palpation.  CARDIOVASCULAR: S1 and S2. Regular rate and rhythm. No murmurs, rubs, or gallops. No edema. Pedal pulses 2+ bilaterally.  GASTROINTESTINAL: Soft, nontender, nondistended. No masses. Positive bowel sounds. No hepatosplenomegaly.  MUSCULOSKELETAL: No swelling, clubbing, or edema. Range of motion full in all extremities.  NEUROLOGIC: Cranial nerves II through XII are intact. No gross focal neurological deficits. Sensation intact. Reflexes intact.  SKIN: No ulceration, lesions, rashes, or cyanosis. Skin warm and dry. Turgor intact.  PSYCHIATRIC: Mood, affect within normal limits. The patient is awake, alert and oriented x 3. Insight, judgment intact.      LABORATORY PANEL:   CBC  Recent Labs Lab 09/10/16 0426  WBC 10.2  HGB 11.2*  HCT 31.5*  PLT 169   ------------------------------------------------------------------------------------------------------------------  Chemistries   Recent Labs Lab 09/09/16 1902 09/10/16 0426  NA 133* 133*  K 3.8 3.7  CL 98* 96*  CO2 28 28  GLUCOSE 124* 159*  BUN 11 11  CREATININE 0.85 0.90  CALCIUM 8.7* 8.8*  AST 23  --   ALT 18  --   ALKPHOS 70  --   BILITOT 0.5  --    ------------------------------------------------------------------------------------------------------------------  Cardiac Enzymes  Recent Labs Lab 09/09/16 1902  TROPONINI <0.03   ------------------------------------------------------------------------------------------------------------------  RADIOLOGY:  No results found.  EKG:   Orders placed or performed during the hospital encounter of 09/09/16  . EKG 12-Lead  . EKG 12-Lead  . EKG 12-Lead  . EKG 12-Lead    ASSESSMENT AND PLAN:   Larence Penning  is a 82 y.o. female presenting with Shortness of Breath . Admitted 09/09/2016 : Day #: 3 days 1.  Acute on chronic respiratory failure with hypoxia: COPD exacerbation, continue oxygen keep O2 saturation greater than 80% Decrease steroids 2. Lung cancer, status post radiation treatment 3. Hypertension essential, continue medications 4. GERD without esophagitis: PPI therapy  Disposition: Physical therapy evaluation  All the records are reviewed and case discussed with Care Management/Social Workerr. Management plans discussed with the patient, family and they are in agreement.  CODE STATUS: full TOTAL TIME TAKING CARE OF THIS PATIENT: 28 minutes.   POSSIBLE D/C IN 1-2DAYS, DEPENDING ON CLINICAL CONDITION.   Hower,  Karenann Cai.D on 09/12/2016 at 12:01 PM  Between 7am to 6pm - Pager - [phone]  After 6pm: House Pager: - [phone]  [facility]  Office  [phone]  CC: Primary care physician; Leonel Ramsay, MD

## 2016-09-13 MED ORDER — PREDNISONE 10 MG (21) PO TBPK: ORAL_TABLET | ORAL | 0 refills | Status: DC

## 2016-09-13 NOTE — Progress Notes (Signed)
Physical Therapy Treatment Patient Details Name: Bethany Lambert MRN: [id number] DOB: [birth year] Today's Date: 09/13/2016    History of Present Illness Pt. presented to ED with SOB, hx. COPD, L lung CA, HTN. Wears 2L O2 at baseline    PT Comments    Pt. Sitting in bed upon arrival, pt. Able to progress mobility today and demonstrate improved although still overall limited tolerance to activity. Pt. Demonstrates mod I bed mobility and sit<>stand transfers with use of RW from various surfaces with supervision. Pt. Was able to ambulate approx. 129f. Total CGA with use of RW she required one sitting break to catch her breath. SpO2 remained at 95% with activity. She was able to perform 6 steps with use of R sided railing CGA, demonstrating reciprocal stepping pattern when ascending and step to pattern leading with RLE when descending. Pt. Demonstrates safe techniques with mobility and good overall safety awareness. Her mobility is limited overall by her functional endurance and decreasd respiratory tolerance to sustained activity. Would benefit from skilled PT to address above deficits and promote optimal return to PLOF Recommend HHPT to follow up with further skilled PT needs upon d/c.   Follow Up Recommendations  Home health PT     Equipment Recommendations  Rolling walker with 5" wheels    Recommendations for Other Services       Precautions / Restrictions Precautions Precautions: Fall Restrictions Weight Bearing Restrictions: No    Mobility  Bed Mobility Overal bed mobility: Modified Independent             General bed mobility comments: Pt. able to perform supine<>sit in both directions without assist, HOB slightly elevated  Transfers Overall transfer level: Needs assistance Equipment used: Rolling walker (2 wheeled) Transfers: Sit to/from Stand Sit to Stand: Supervision         General transfer comment: Pt. demonstrates good safety awareness performing sit<>stand  transfers with safe technique. Slow movement transitions  Ambulation/Gait Ambulation/Gait assistance: Min guard Ambulation Distance (Feet): 150 Feet Assistive device: Rolling walker (2 wheeled)       General Gait Details: Pt. demonstrates slowed cadence, even step length step through pattern, requires RW for B UE assist. pt. did take sitting break after about 742f    Stairs Stairs: Yes Stairs assistance: Min guard Stair Management: One rail Right Number of Stairs: 6 General stair comments: Pt. able to ascend stairs demonstrating reciprocal pattern and RUE assist, when descending stairs demonstrates step to pattern leading with RLE, LUE supported.   Wheelchair Mobility    Modified Rankin (Stroke Patients Only)       Balance Overall balance assessment: Needs assistance Sitting-balance support: Feet supported Sitting balance-Leahy Scale: Good     Standing balance support: Bilateral upper extremity supported Standing balance-Leahy Scale: Good                      Cognition Arousal/Alertness: Awake/alert Behavior During Therapy: WFL for tasks assessed/performed Overall Cognitive Status: Within Functional Limits for tasks assessed                      Exercises      General Comments        Pertinent Vitals/Pain Pain Assessment: No/denies pain    Home Living                      Prior Function            PT Goals (current goals can  now be found in the care plan section) Acute Rehab PT Goals Patient Stated Goal: Pt. would like to improve endurance to return to baseline function PT Goal Formulation: With patient Time For Goal Achievement: 09/26/16 Potential to Achieve Goals: Good Progress towards PT goals: Progressing toward goals    Frequency    Min 2X/week      PT Plan Discharge plan needs to be updated    Co-evaluation             End of Session Equipment Utilized During Treatment: Gait belt;Oxygen Activity  Tolerance: Patient tolerated treatment well;Patient limited by fatigue (Pt. requires rest breaks to catch her breath) Patient left: in chair;with call bell/phone within reach;with chair alarm set     Time: 9211-9417 PT Time Calculation (min) (ACUTE ONLY): 13 min  Charges:                       G Codes:      Melanie Crazier, SPT  09-23-16,4:04 PM

## 2016-09-13 NOTE — Progress Notes (Signed)
Patient declined SNF with RN, RNCM and MD. Patient agreed to [facility] services. RNCM arranging services.  The nature of sun-induced photo-aging and skin cancers is discussed.  Sun avoidance, protective clothing, and the use of 30-SPF sunscreens is advised. Observe closely for skin damage/changes, and call if such occurs.

## 2016-09-13 NOTE — Care Management Note (Signed)
Case Management Note  Patient Details  Name: TANIESHA GLANZ MRN: [id number] Date of Birth: [birth year]  Subjective/Objective:   Discussed discharge planning with Ms Pacetti. Ms Donalson reports that she lives with a son who is with her every night. She reports that another son and his wife will be checking her throughout the day after she returns home. A referrral for home health PT, RN,and aide was called to Tharon Aquas at [facility].                Action/Plan:   Expected Discharge Date:                  Expected Discharge Plan:     In-House Referral:     Discharge planning Services     Post Acute Care Choice:    Choice offered to:     DME Arranged:    DME Agency:     HH Arranged:    HH Agency:     Status of Service:     If discussed at [employer] of Stay Meetings, dates discussed:    Additional Comments:  Nakul Avino A, RN 09/13/2016, 12:28 PM

## 2016-09-13 NOTE — Progress Notes (Signed)
Patient discharge paperwork reviewed with patient who verbalized understanding. Prednisone prescription sent electronically, patient aware. Patient's daughter to transport home. Patient's home oxygen tank present for discharge.

## 2016-09-13 NOTE — Discharge Summary (Signed)
[facility] NAME: Sherra Kimmons    MR#:  [id number]  DATE OF BIRTH:  September 22, 1934  DATE OF ADMISSION:  09/09/2016 ADMITTING PHYSICIAN: Lance Coon, MD  DATE OF DISCHARGE: 09/13/16  PRIMARY CARE PHYSICIAN: Leonel Ramsay, MD    ADMISSION DIAGNOSIS:  COPD with acute exacerbation ([place]) [J44.1]  DISCHARGE DIAGNOSIS:  Principal Problem: Acute on chronic respiratory failure with hypoxia   COPD exacerbation (HCC) Active Problems:   Cancer of upper lobe of left lung (HCC)   GERD (gastroesophageal reflux disease)   HTN (hypertension)   SECONDARY DIAGNOSIS:   Past Medical History:  Diagnosis Date  . COPD (chronic obstructive pulmonary disease) ([place])   . Erythrocytosis   . GERD (gastroesophageal reflux disease)   . History of chemotherapy   . History of radiation therapy   . Hyperlipidemia   . Hypertension   . Kidney failure   . Lung cancer ([place])    squamous, stage 3 non small cell lung ca  . Polycythemia   . Psoriasis     HOSPITAL COURSE:  August Gosser  is a 82 y.o. female admitted 09/09/2016 with chief complaint Shortness of Breath . Please see H&P performed by Lance Coon, MD for further information. Patient presented with the above complaints and found to have COPD exacerbation. She required increased levels of oxygen. She has improved with receiving breathing treatments steroids and antibiotics and is now back to her baseline oxygen requirements  DISCHARGE CONDITIONS:   Stable  CONSULTS OBTAINED:    DRUG ALLERGIES:   Allergies  Allergen Reactions  . Ciprofloxacin   . Fluocinolone Other (See Comments)  . Norvasc [Amlodipine Besylate]     DISCHARGE MEDICATIONS:   Current Discharge Medication List    START taking these medications   Details  predniSONE (STERAPRED UNI-PAK 21 TAB) 10 MG (21) TBPK tablet '40mg'$  x1day, '20mg'$  x2day, '10mg'$  x2 day then stop Qty: 10 tablet, Refills: 0      CONTINUE  these medications which have NOT CHANGED   Details  albuterol (PROVENTIL HFA;VENTOLIN HFA) 108 (90 BASE) MCG/ACT inhaler Inhale 2 puffs into the lungs every 6 (six) hours as needed for wheezing.    budesonide-formoterol (SYMBICORT) 80-4.5 MCG/ACT inhaler Inhale 2 puffs into the lungs as needed.     calcium carbonate (OS-CAL) 1250 (500 Ca) MG chewable tablet Chew 1 tablet by mouth daily.    Cyanocobalamin (VITAMIN B-12 IJ) Inject as directed every 30 (thirty) days.    diltiazem (DILACOR XR) 120 MG 24 hr capsule Take 120 mg by mouth daily.    docusate sodium (COLACE) 100 MG capsule Take 100 mg by mouth 2 (two) times daily.    fluocinonide (LIDEX) 0.05 % external solution Apply 1 application topically 2 (two) times daily.     guaiFENesin (MUCINEX) 600 MG 12 hr tablet Take 600 mg by mouth 2 (two) times daily.    ipratropium-albuterol (DUONEB) 0.5-2.5 (3) MG/3ML SOLN Take 3 mLs by nebulization.    metoprolol (LOPRESSOR) 50 MG tablet Take 1 tablet (50 mg total) by mouth 2 (two) times daily.   Associated Diagnoses: Tachycardia    Multiple Vitamin (MULTIVITAMIN) tablet Take 1 tablet by mouth daily.    omeprazole (PRILOSEC) 20 MG capsule Take 20 mg by mouth 2 (two) times daily before a meal. Takes 20 mg am and 40 mg pm daily.    potassium chloride (K-DUR,KLOR-CON) 10 MEQ tablet Take 10 mEq by mouth daily.    Prenatal Vit-Fe  Fumarate-FA (PRENAVITE MULTIPLE VITAMIN PO) Take by mouth daily. Reported on 01/27/2016    tiotropium (SPIRIVA) 18 MCG inhalation capsule Place 18 mcg into inhaler and inhale daily.    triamcinolone cream (KENALOG) 0.1 % Apply 1 application topically 2 (two) times daily.          DISCHARGE INSTRUCTIONS:    DIET:  Cardiac diet  DISCHARGE CONDITION:  Stable  ACTIVITY:  Activity as tolerated  OXYGEN:  Home Oxygen: Yes.     Oxygen Delivery: 2 liters/min via Patient connected to nasal cannula oxygen  DISCHARGE LOCATION:  home   If you experience  worsening of your admission symptoms, develop shortness of breath, life threatening emergency, suicidal or homicidal thoughts you must seek medical attention immediately by calling 911 or calling your MD immediately  if symptoms less severe.  You Must read complete instructions/literature along with all the possible adverse reactions/side effects for all the Medicines you take and that have been prescribed to you. Take any new Medicines after you have completely understood and accpet all the possible adverse reactions/side effects.   Please note  You were cared for by a hospitalist during your hospital stay. If you have any questions about your discharge medications or the care you received while you were in the hospital after you are discharged, you can call the unit and asked to speak with the hospitalist on call if the hospitalist that took care of you is not available. Once you are discharged, your primary care physician will handle any further medical issues. Please note that NO REFILLS for any discharge medications will be authorized once you are discharged, as it is imperative that you return to your primary care physician (or establish a relationship with a primary care physician if you do not have one) for your aftercare needs so that they can reassess your need for medications and monitor your lab values.    On the day of Discharge:   VITAL SIGNS:  Blood pressure (!) 174/51, pulse 76, temperature 97.8 F (36.6 C), temperature source Oral, resp. rate 18, height '4\' 10"'$  (1.473 m), weight 54.4 kg (120 lb), SpO2 95 %.  I/O:  No intake or output data in the 24 hours ending 09/13/16 1156  PHYSICAL EXAMINATION:  GENERAL:  82 y.o.-year-old patient lying in the bed with no acute distress.  EYES: Pupils equal, round, reactive to light and accommodation. No scleral icterus. Extraocular muscles intact.  HEENT: Head atraumatic, normocephalic. Oropharynx and nasopharynx clear.  NECK:  Supple, no  jugular venous distention. No thyroid enlargement, no tenderness.  LUNGS: Normal breath sounds bilaterally, no wheezing, rales,rhonchi or crepitation. No use of accessory muscles of respiration.  CARDIOVASCULAR: S1, S2 normal. No murmurs, rubs, or gallops.  ABDOMEN: Soft, non-tender, non-distended. Bowel sounds present. No organomegaly or mass.  EXTREMITIES: No pedal edema, cyanosis, or clubbing.  NEUROLOGIC: Cranial nerves II through XII are intact. Muscle strength 5/5 in all extremities. Sensation intact. Gait not checked.  PSYCHIATRIC: The patient is alert and oriented x 3.  SKIN: No obvious rash, lesion, or ulcer.   DATA REVIEW:   CBC  Recent Labs Lab 09/10/16 0426  WBC 10.2  HGB 11.2*  HCT 31.5*  PLT 169    Chemistries   Recent Labs Lab 09/09/16 1902 09/10/16 0426  NA 133* 133*  K 3.8 3.7  CL 98* 96*  CO2 28 28  GLUCOSE 124* 159*  BUN 11 11  CREATININE 0.85 0.90  CALCIUM 8.7* 8.8*  AST 23  --  ALT 18  --   ALKPHOS 70  --   BILITOT 0.5  --     Cardiac Enzymes  Recent Labs Lab 09/09/16 1902  TROPONINI <0.03    Microbiology Results  Results for orders placed or performed during the hospital encounter of 09/09/16  Blood culture (routine x 2)     Status: None (Preliminary result)   Collection Time: 09/09/16  8:10 PM  Result Value Ref Range Status   Specimen Description BLOOD LEFT ASSIST CONTROL  Final   Special Requests BOTTLES DRAWN AEROBIC AND ANAEROBIC 5CC  Final   Culture NO GROWTH 4 DAYS  Final   Report Status PENDING  Incomplete  Blood culture (routine x 2)     Status: None (Preliminary result)   Collection Time: 09/09/16  8:10 PM  Result Value Ref Range Status   Specimen Description BLOOD RIGHT ASSIST CONTROL  Final   Special Requests BOTTLES DRAWN AEROBIC AND ANAEROBIC 10CC  Final   Culture NO GROWTH 4 DAYS  Final   Report Status PENDING  Incomplete    RADIOLOGY:  No results found.   Management plans discussed with the patient, family  and they are in agreement.  CODE STATUS:     Code Status Orders        Start     Ordered   09/09/16 2317  Full code  Continuous     09/09/16 2317    Code Status History    Date Active Date Inactive Code Status Order ID Comments User Context   This patient has a current code status but no historical code status.      TOTAL TIME TAKING CARE OF THIS PATIENT: 33 minutes.    Hower,  Karenann Cai.D on 09/13/2016 at 11:56 AM  Between 7am to 6pm - Pager - [phone]  After 6pm go to [email or web address] - [employer] [facility]  Office  [phone]  CC: Primary care physician; Leonel Ramsay, MD

## 2016-09-14 LAB — CULTURE, BLOOD (ROUTINE X 2)
Culture: NO GROWTH
Culture: NO GROWTH

## 2016-10-05 ENCOUNTER — Inpatient Hospital Stay: Payer: Medicare Other

## 2016-10-05 ENCOUNTER — Inpatient Hospital Stay: Payer: Medicare Other | Attending: Internal Medicine | Admitting: Internal Medicine

## 2016-10-05 VITALS — BP 130/64 | HR 73 | Temp 98.3°F | Resp 18 | Wt 117.0 lb

## 2016-10-05 DIAGNOSIS — Z79899 Other long term (current) drug therapy: Secondary | ICD-10-CM | POA: Insufficient documentation

## 2016-10-05 DIAGNOSIS — C3412 Malignant neoplasm of upper lobe, left bronchus or lung: Secondary | ICD-10-CM | POA: Diagnosis present

## 2016-10-05 DIAGNOSIS — R911 Solitary pulmonary nodule: Secondary | ICD-10-CM | POA: Insufficient documentation

## 2016-10-05 DIAGNOSIS — I10 Essential (primary) hypertension: Secondary | ICD-10-CM | POA: Diagnosis not present

## 2016-10-05 DIAGNOSIS — Z9221 Personal history of antineoplastic chemotherapy: Secondary | ICD-10-CM | POA: Diagnosis not present

## 2016-10-05 DIAGNOSIS — Z87891 Personal history of nicotine dependence: Secondary | ICD-10-CM | POA: Insufficient documentation

## 2016-10-05 DIAGNOSIS — E785 Hyperlipidemia, unspecified: Secondary | ICD-10-CM | POA: Diagnosis not present

## 2016-10-05 DIAGNOSIS — Z923 Personal history of irradiation: Secondary | ICD-10-CM | POA: Insufficient documentation

## 2016-10-05 DIAGNOSIS — K219 Gastro-esophageal reflux disease without esophagitis: Secondary | ICD-10-CM | POA: Diagnosis not present

## 2016-10-05 LAB — BASIC METABOLIC PANEL
Anion gap: 8 (ref 5–15)
BUN: 12 mg/dL (ref 6–20)
CHLORIDE: 100 mmol/L — AB (ref 101–111)
CO2: 29 mmol/L (ref 22–32)
CREATININE: 0.83 mg/dL (ref 0.44–1.00)
Calcium: 8.2 mg/dL — ABNORMAL LOW (ref 8.9–10.3)
GFR calc non Af Amer: >60 mL/min (ref >60)
Glucose, Bld: 96 mg/dL (ref 65–99)
POTASSIUM: 3.2 mmol/L — AB (ref 3.5–5.1)
Sodium: 137 mmol/L (ref 135–145)

## 2016-10-05 LAB — CBC WITH DIFFERENTIAL/PLATELET
BASOS PCT: 1 %
Basophils Absolute: 0 10*3/uL (ref 0–0.1)
Eosinophils Absolute: 0.1 10*3/uL (ref 0–0.7)
Eosinophils Relative: 3 %
HEMATOCRIT: 28.9 % — AB (ref 35.0–47.0)
HEMOGLOBIN: 10 g/dL — AB (ref 12.0–16.0)
LYMPHS ABS: 1 10*3/uL (ref 1.0–3.6)
Lymphocytes Relative: 27 %
MCH: 31.8 pg (ref 26.0–34.0)
MCHC: 34.6 g/dL (ref 32.0–36.0)
MCV: 91.8 fL (ref 80.0–100.0)
MONOS PCT: 10 %
Monocytes Absolute: 0.4 10*3/uL (ref 0.2–0.9)
NEUTROS ABS: 2.2 10*3/uL (ref 1.4–6.5)
NEUTROS PCT: 59 %
Platelets: 291 10*3/uL (ref 150–440)
RBC: 3.14 MIL/uL — AB (ref 3.80–5.20)
RDW: 13.5 % (ref 11.5–14.5)
WBC: 3.8 10*3/uL (ref 3.6–11.0)

## 2016-10-05 NOTE — Progress Notes (Signed)
[facility] OFFICE PROGRESS NOTE  Patient Care Team: Leonel Ramsay, MD as PCP - General (Infectious Diseases)   SUMMARY OF ONCOLOGIC HISTORY:  Oncology History   # DEC 2013- Squamous cell CA [ Stage IIIA; T4- invading mediastinum; CT Bx] s/p carbo-taxol with RT [stopped chemo x 2 cycles; Feb 26th 2014- poor tol];   # CT feb 2017- Stable Left lung changes; ~12x81m RUL- June 30th 2017- s/p RT x5 Fx [Dr.Crystal Aug 2017].   # COPD Home O2 2.5L -     Cancer of upper lobe of left lung ([facility]      INTERVAL HISTORY:  82year-old female patient with above history of stage IIIa squamous cell cancer of the left lung status post chemoradiation- finished spring of 2014; Also stage I right upper lung cancer status post radiation approximately 2 months ago. She is here for follow-up.  In the interim patient was admitted to hospital for COPD exacerbation. Patient denies any unusual shortness of breath or cough- she has chronic shortness of breath, COPD currently on home O2. No hemoptysis no headaches no vision changes or double vision. Patient continues to have dizzy spells for which she has been started on Meclizine.  Denies any weight loss or abdominal pain nausea vomiting.   REVIEW OF SYSTEMS:  A complete 10 point review of system is done which is negative except mentioned above/history of present illness.   PAST MEDICAL HISTORY :  Past Medical History:  Diagnosis Date  . COPD [place] obstructive pulmonary disease) [place]   . Erythrocytosis   . GERD (gastroesophageal reflux disease)   . History of chemotherapy   . History of radiation therapy   . Hyperlipidemia   . Hypertension   . Kidney failure   . Lung cancer ([place]    squamous, stage 3 non small cell lung ca  . Polycythemia   . Psoriasis     PAST SURGICAL HISTORY :   Past Surgical History:  Procedure Laterality Date  . CATARACT EXTRACTION    . COLON RESECTION    . CT GUIDED BIOPSY  ([facility] HX)  11/19/2012    lung  . HERNIA REPAIR    . TOTAL ABDOMINAL HYSTERECTOMY      FAMILY HISTORY :   Family History  Problem Relation Age of Onset  . Heart attack Father   . Cancer Son     SOCIAL HISTORY:   Social History  Substance Use Topics  . Smoking status: Former Smoker    Packs/day: 2.00    Years: 40.00    Types: Cigarettes  . Smokeless tobacco: Never Used  . Alcohol use No    ALLERGIES:  is allergic to ciprofloxacin; fluocinolone; and norvasc [amlodipine besylate].  MEDICATIONS:  Current Outpatient Prescriptions  Medication Sig Dispense Refill  . albuterol (PROVENTIL HFA;VENTOLIN HFA) 108 (90 BASE) MCG/ACT inhaler Inhale 2 puffs into the lungs every 6 (six) hours as needed for wheezing.    . budesonide-formoterol (SYMBICORT) 80-4.5 MCG/ACT inhaler Inhale 2 puffs into the lungs as needed.     . calcium carbonate (OS-CAL) 1250 (500 Ca) MG chewable tablet Chew 1 tablet by mouth daily.    . Cyanocobalamin (VITAMIN B-12 IJ) Inject as directed every 30 (thirty) days.    .Marland Kitchendiltiazem (DILACOR XR) 120 MG 24 hr capsule Take 120 mg by mouth daily.    .Marland Kitchendocusate sodium (COLACE) 100 MG capsule Take 100 mg by mouth 2 (two) times daily.    .Marland KitchenguaiFENesin (MUCINEX) 600 MG  12 hr tablet Take 600 mg by mouth 2 (two) times daily.    Marland Kitchen ipratropium-albuterol (DUONEB) 0.5-2.5 (3) MG/3ML SOLN Take 3 mLs by nebulization.    . meclizine (ANTIVERT) 12.5 MG tablet Take 12.5 mg by mouth 3 (three) times daily as needed for dizziness.    . metoprolol (LOPRESSOR) 50 MG tablet Take 1 tablet (50 mg total) by mouth 2 (two) times daily.    . Multiple Vitamin (MULTIVITAMIN) tablet Take 1 tablet by mouth daily.    Marland Kitchen omeprazole (PRILOSEC) 20 MG capsule Take 20 mg by mouth 2 (two) times daily before a meal. Takes 20 mg am and 40 mg pm daily.    . potassium chloride (K-DUR,KLOR-CON) 10 MEQ tablet Take 10 mEq by mouth daily.    . Prenatal Vit-Fe Fumarate-FA (PRENAVITE MULTIPLE VITAMIN PO) Take by mouth daily. Reported on  01/27/2016    . tiotropium (SPIRIVA) 18 MCG inhalation capsule Place 18 mcg into inhaler and inhale daily.    . fluocinonide (LIDEX) 0.05 % external solution Apply 1 application topically 2 (two) times daily.     . predniSONE (STERAPRED UNI-PAK 21 TAB) 10 MG (21) TBPK tablet '40mg'$  x1day, '20mg'$  x2day, '10mg'$  x2 day then stop (Patient not taking: Reported on 10/05/2016) 10 tablet 0  . triamcinolone cream (KENALOG) 0.1 % Apply 1 application topically 2 (two) times daily.      No current facility-administered medications for this visit.     PHYSICAL EXAMINATION:   BP 130/64 (BP Location: Left Arm, Patient Position: Sitting)   Pulse 73   Temp 98.3 F (36.8 C) (Tympanic)   Resp 18   Wt 117 lb (53.1 kg)   SpO2 98% Comment: patient is on oxyen with 2.5 l  BMI 24.45 kg/m   Filed Weights   10/05/16 1425  Weight: 117 lb (53.1 kg)    GENERAL: patient appears frail; wearing her oxygen. She is walking herself. Alert, no distress and comfortable.  Accompanied  By daughter. She is in a wheel chair.  EYES: no pallor or icterus OROPHARYNX: no thrush or ulceration; good dentition  NECK: supple, no masses felt LYMPH:  no palpable lymphadenopathy in the cervical, axillary or inguinal regions LUNGS: decreased air entry bilaterally. No wheeze or crackles HEART/CVS: regular rate & rhythm and no murmurs; No lower extremity edema ABDOMEN:abdomen soft, non-tender and normal bowel sounds Musculoskeletal:no cyanosis of digits and no clubbing  PSYCH: alert & oriented x 3 with fluent speech NEURO: no focal motor/sensory deficits SKIN:  no rashes or significant lesions  LABORATORY DATA:  I have reviewed the data as listed    Component Value Date/Time   NA 137 10/05/2016 1357   NA 136 12/29/2014 0827   K 3.2 (L) 10/05/2016 1357   K 4.0 12/29/2014 0827   CL 100 (L) 10/05/2016 1357   CL 102 12/29/2014 0827   CO2 29 10/05/2016 1357   CO2 28 12/29/2014 0827   GLUCOSE 96 10/05/2016 1357   GLUCOSE 115 (H)  12/29/2014 0827   BUN 12 10/05/2016 1357   BUN 15 12/29/2014 0827   CREATININE 0.83 10/05/2016 1357   CREATININE 1.23 12/29/2014 0827   CALCIUM 8.2 (L) 10/05/2016 1357   CALCIUM 8.8 12/29/2014 0827   PROT 6.8 09/09/2016 1902   PROT 6.3 (L) 12/01/2014 1355   ALBUMIN 3.6 09/09/2016 1902   ALBUMIN 3.2 (L) 12/01/2014 1355   AST 23 09/09/2016 1902   AST 14 (L) 12/01/2014 1355   ALT 18 09/09/2016 1902   ALT 22  12/01/2014 1355   ALKPHOS 70 09/09/2016 1902   ALKPHOS 100 12/01/2014 1355   BILITOT 0.5 09/09/2016 1902   BILITOT 0.3 12/01/2014 1355   GFRNONAA >60 10/05/2016 1357   GFRNONAA 45 (L) 12/29/2014 0827   GFRNONAA 42 (L) 08/13/2014 1020   GFRAA >60 10/05/2016 1357   GFRAA 54 (L) 12/29/2014 0827   GFRAA 49 (L) 08/13/2014 1020    No results found for: SPEP, UPEP  Lab Results  Component Value Date   WBC 3.8 10/05/2016   NEUTROABS 2.2 10/05/2016   HGB 10.0 (L) 10/05/2016   HCT 28.9 (L) 10/05/2016   MCV 91.8 10/05/2016   PLT 291 10/05/2016      Chemistry      Component Value Date/Time   NA 137 10/05/2016 1357   NA 136 12/29/2014 0827   K 3.2 (L) 10/05/2016 1357   K 4.0 12/29/2014 0827   CL 100 (L) 10/05/2016 1357   CL 102 12/29/2014 0827   CO2 29 10/05/2016 1357   CO2 28 12/29/2014 0827   BUN 12 10/05/2016 1357   BUN 15 12/29/2014 0827   CREATININE 0.83 10/05/2016 1357   CREATININE 1.23 12/29/2014 0827      Component Value Date/Time   CALCIUM 8.2 (L) 10/05/2016 1357   CALCIUM 8.8 12/29/2014 0827   ALKPHOS 70 09/09/2016 1902   ALKPHOS 100 12/01/2014 1355   AST 23 09/09/2016 1902   AST 14 (L) 12/01/2014 1355   ALT 18 09/09/2016 1902   ALT 22 12/01/2014 1355   BILITOT 0.5 09/09/2016 1902   BILITOT 0.3 12/01/2014 1355       RADIOGRAPHIC STUDIES: I have personally reviewed the radiological images as listed and agreed with the findings in the report. No results found.   ASSESSMENT & PLAN:   Cancer of upper lobe of left lung ([place])  # NEW Right upper lobe  lung nodule 11 x 8 mm in size s/p SBRT in end of July 2017. Will repeat CT scan in 2 months  # stage IIIA/T4-invading the mediastinum/squamous cell carcinoma of the left lung- status post chemoradiation 2014. June 30th 2017- CT scan shows stable left lung scarring from radiation  # follow up with me in Feb 2018/ labs.      Cammie Sickle, MD 10/05/2016 5:17 PM

## 2016-10-05 NOTE — Progress Notes (Signed)
Patient is here for follow up, she mentions she is dizzy, was seen by ENT for vertigo. She is always dizzy except when she is laying down.

## 2016-10-05 NOTE — Assessment & Plan Note (Addendum)
#   NEW Right upper lobe lung nodule 11 x 8 mm in size s/p SBRT in end of July 2017. Will repeat CT scan in 2 months  # stage IIIA/T4-invading the mediastinum/squamous cell carcinoma of the left lung- status post chemoradiation 2014. June 30th 2017- CT scan shows stable left lung scarring from radiation  # follow up with me in Feb 2018/ labs.

## 2016-12-12 ENCOUNTER — Ambulatory Visit
Admission: RE | Admit: 2016-12-12 | Discharge: 2016-12-12 | Disposition: A | Discharge status: Home / Self Care | Payer: Medicare Other | Source: Ambulatory Visit | Attending: Radiation Oncology | Admitting: Radiation Oncology

## 2016-12-12 DIAGNOSIS — J439 Emphysema, unspecified: Secondary | ICD-10-CM | POA: Insufficient documentation

## 2016-12-12 DIAGNOSIS — K862 Cyst of pancreas: Secondary | ICD-10-CM | POA: Diagnosis not present

## 2016-12-12 DIAGNOSIS — I2510 Atherosclerotic heart disease of native coronary artery without angina pectoris: Secondary | ICD-10-CM | POA: Insufficient documentation

## 2016-12-12 DIAGNOSIS — R918 Other nonspecific abnormal finding of lung field: Secondary | ICD-10-CM | POA: Diagnosis not present

## 2016-12-12 DIAGNOSIS — Z85118 Personal history of other malignant neoplasm of bronchus and lung: Secondary | ICD-10-CM | POA: Insufficient documentation

## 2016-12-12 DIAGNOSIS — I700 Atherosclerosis of aorta: Secondary | ICD-10-CM | POA: Diagnosis not present

## 2016-12-12 LAB — POCT I-STAT CREATININE: Creatinine, Ser: 0.9 mg/dL (ref 0.44–1.00)

## 2016-12-12 MED ORDER — IOPAMIDOL (ISOVUE-300) INJECTION 61%
75.0000 mL | Freq: Once | INTRAVENOUS | Status: AC | PRN
  Administered 2016-12-12: 75 mL via INTRAVENOUS

## 2016-12-19 ENCOUNTER — Other Ambulatory Visit: Payer: Self-pay | Admitting: *Deleted

## 2016-12-19 ENCOUNTER — Ambulatory Visit
Admission: RE | Admit: 2016-12-19 | Discharge: 2016-12-19 | Disposition: A | Discharge status: Home / Self Care | Payer: Medicare Other | Source: Ambulatory Visit | Attending: Radiation Oncology | Admitting: Radiation Oncology

## 2016-12-19 ENCOUNTER — Encounter: Payer: Self-pay | Admitting: Radiation Oncology

## 2016-12-19 VITALS — BP 154/69 | HR 73 | Temp 97.5°F | Resp 18 | Wt 120.4 lb

## 2016-12-19 DIAGNOSIS — C3411 Malignant neoplasm of upper lobe, right bronchus or lung: Secondary | ICD-10-CM | POA: Insufficient documentation

## 2016-12-19 DIAGNOSIS — Z923 Personal history of irradiation: Secondary | ICD-10-CM | POA: Diagnosis not present

## 2016-12-19 DIAGNOSIS — Z87891 Personal history of nicotine dependence: Secondary | ICD-10-CM | POA: Insufficient documentation

## 2016-12-19 NOTE — Progress Notes (Signed)
Radiation Oncology Follow up Note  Name: Bethany [facility]   Date:   12/19/2016 MRN:  [id number] DOB: Sep 13, 1934    This 83 y.o. female presents to the clinic today for 5 month follow-up status post SB RT to right upper lobe.  REFERRING PROVIDER: Leonel Ramsay, MD  HPI: Patient is a 83 year old female now out 5 months having completed SB RT to her right upper lobe for non-small cell lung cancer. She is seen today in routine follow-up and is doing well she specifically denies cough hemoptysis or chest tightness.. Recent CT scan this month showed reduced size of right upper lobe nodule. There is also a slight increase in density and associated sub-solid opacity in the right upper lobe which merits surveillance. Clinically she is doing well.  COMPLICATIONS OF TREATMENT: none  FOLLOW UP COMPLIANCE: keeps appointments   PHYSICAL EXAM:  BP (!) 154/69   Pulse 73   Temp 97.5 F (36.4 C)   Resp 18   Wt 120 lb 5.9 oz (54.6 kg)   BMI 25.16 kg/m  Well-developed well-nourished patient in NAD. HEENT reveals PERLA, EOMI, discs not visualized.  Oral cavity is clear. No oral mucosal lesions are identified. Neck is clear without evidence of cervical or supraclavicular adenopathy. Lungs are clear to A&P. Cardiac examination is essentially unremarkable with regular rate and rhythm without murmur rub or thrill. Abdomen is benign with no organomegaly or masses noted. Motor sensory and DTR levels are equal and symmetric in the upper and lower extremities. Cranial nerves II through XII are grossly intact. Proprioception is intact. No peripheral adenopathy or edema is identified. No motor or sensory levels are noted. Crude visual fields are within normal range.  RADIOLOGY RESULTS: Most recent CT scans are reviewed and compatible with the above-stated findings  PLAN: Present time patient is doing well now out 5 months status post SB RT. I've asked to see her back in 6 months for follow-up and will get a  follow-up CT scan with contrast at that time. Patient is doing well she knows to call with any concerns.  I would like to take this opportunity to thank you for allowing me to participate in the care of your patient.Armstead Peaks., MD

## 2017-01-09 ENCOUNTER — Inpatient Hospital Stay: Payer: Medicare Other

## 2017-01-09 ENCOUNTER — Inpatient Hospital Stay: Payer: Medicare Other | Attending: Internal Medicine | Admitting: Internal Medicine

## 2017-01-09 VITALS — BP 147/47 | HR 65 | Temp 98.2°F | Wt 117.4 lb

## 2017-01-09 DIAGNOSIS — Z9221 Personal history of antineoplastic chemotherapy: Secondary | ICD-10-CM

## 2017-01-09 DIAGNOSIS — E785 Hyperlipidemia, unspecified: Secondary | ICD-10-CM | POA: Diagnosis not present

## 2017-01-09 DIAGNOSIS — J449 Chronic obstructive pulmonary disease, unspecified: Secondary | ICD-10-CM | POA: Diagnosis not present

## 2017-01-09 DIAGNOSIS — I10 Essential (primary) hypertension: Secondary | ICD-10-CM | POA: Diagnosis not present

## 2017-01-09 DIAGNOSIS — Z79899 Other long term (current) drug therapy: Secondary | ICD-10-CM | POA: Insufficient documentation

## 2017-01-09 DIAGNOSIS — Z87891 Personal history of nicotine dependence: Secondary | ICD-10-CM | POA: Diagnosis not present

## 2017-01-09 DIAGNOSIS — C3412 Malignant neoplasm of upper lobe, left bronchus or lung: Secondary | ICD-10-CM | POA: Diagnosis not present

## 2017-01-09 DIAGNOSIS — K219 Gastro-esophageal reflux disease without esophagitis: Secondary | ICD-10-CM | POA: Insufficient documentation

## 2017-01-09 DIAGNOSIS — Z923 Personal history of irradiation: Secondary | ICD-10-CM | POA: Diagnosis not present

## 2017-01-09 LAB — CBC WITH DIFFERENTIAL/PLATELET
BASOS ABS: 0.1 10*3/uL (ref 0–0.1)
BASOS PCT: 1 %
Eosinophils Absolute: 0.2 10*3/uL (ref 0–0.7)
Eosinophils Relative: 3 %
HEMATOCRIT: 32.1 % — AB (ref 35.0–47.0)
HEMOGLOBIN: 11.4 g/dL — AB (ref 12.0–16.0)
Lymphocytes Relative: 20 %
Lymphs Abs: 1.4 10*3/uL (ref 1.0–3.6)
MCH: 32.1 pg (ref 26.0–34.0)
MCHC: 35.5 g/dL (ref 32.0–36.0)
MCV: 90.6 fL (ref 80.0–100.0)
Monocytes Absolute: 0.5 10*3/uL (ref 0.2–0.9)
Monocytes Relative: 7 %
NEUTROS ABS: 4.8 10*3/uL (ref 1.4–6.5)
NEUTROS PCT: 69 %
Platelets: 199 10*3/uL (ref 150–440)
RBC: 3.54 MIL/uL — ABNORMAL LOW (ref 3.80–5.20)
RDW: 13.8 % (ref 11.5–14.5)
WBC: 6.9 10*3/uL (ref 3.6–11.0)

## 2017-01-09 LAB — COMPREHENSIVE METABOLIC PANEL
ALBUMIN: 3.7 g/dL (ref 3.5–5.0)
ALK PHOS: 80 U/L (ref 38–126)
ALT: 18 U/L (ref 14–54)
AST: 22 U/L (ref 15–41)
Anion gap: 6 (ref 5–15)
BILIRUBIN TOTAL: 0.3 mg/dL (ref 0.3–1.2)
BUN: 16 mg/dL (ref 6–20)
CALCIUM: 9 mg/dL (ref 8.9–10.3)
CO2: 29 mmol/L (ref 22–32)
Chloride: 103 mmol/L (ref 101–111)
Creatinine, Ser: 1.02 mg/dL — ABNORMAL HIGH (ref 0.44–1.00)
GFR calc Af Amer: 59 mL/min — ABNORMAL LOW (ref >60)
GFR calc non Af Amer: 51 mL/min — ABNORMAL LOW (ref >60)
GLUCOSE: 101 mg/dL — AB (ref 65–99)
POTASSIUM: 3.9 mmol/L (ref 3.5–5.1)
Sodium: 138 mmol/L (ref 135–145)
Total Protein: 6.7 g/dL (ref 6.5–8.1)

## 2017-01-09 NOTE — Progress Notes (Signed)
[place] OFFICE PROGRESS NOTE  Patient Care Team: Leonel Ramsay, MD as PCP - General (Infectious Diseases)   SUMMARY OF ONCOLOGIC HISTORY:  Oncology History   # DEC 2013- Squamous cell CA [ Stage IIIA; T4- invading mediastinum; CT Bx] s/p carbo-taxol with RT [stopped chemo x 2 cycles; Feb 26th 2014- poor tol];   # CT feb 2017- Stable Left lung changes; ~12x7m RUL- June 30th 2017- s/p RT x5 Fx [Dr.Crystal Aug 2017].   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  She is here for follow-up Accompanied by her son.  Patient continues to be on oxygen for COPD. Denies any worsening shortness of breath. She continues to have chronic shortness of breath or chronic cough. No worse.  No hemoptysis no headaches no vision changes or double vision. Denies any weight loss or abdominal pain nausea vomiting.    REVIEW OF SYSTEMS:  A complete 10 point review of system is done which is negative except mentioned above/history of present illness.   PAST MEDICAL HISTORY :  Past Medical History:  Diagnosis Date  . COPD (chronic obstructive pulmonary disease) ([place]   . Erythrocytosis   . GERD (gastroesophageal reflux disease)   . History of chemotherapy   . History of radiation therapy   . Hyperlipidemia   . Hypertension   . Kidney failure   . Lung cancer ([place]    squamous, stage 3 non small cell lung ca  . Polycythemia   . Psoriasis     PAST SURGICAL HISTORY :   Past Surgical History:  Procedure Laterality Date  . CATARACT EXTRACTION    . COLON RESECTION    . CT GUIDED BIOPSY  ([facility] HX)  11/19/2012   lung  . HERNIA REPAIR    . TOTAL ABDOMINAL HYSTERECTOMY      FAMILY HISTORY :   Family  History  Problem Relation Age of Onset  . Heart attack Father   . Cancer Son     SOCIAL HISTORY:   Social History  Substance Use Topics  . Smoking status: Former Smoker    Packs/day: 2.00    Years: 40.00    Types: Cigarettes  . Smokeless tobacco: Never Used  . Alcohol use No    ALLERGIES:  is allergic to ciprofloxacin; fluocinolone; and norvasc [amlodipine besylate].  MEDICATIONS:  Current Outpatient Prescriptions  Medication Sig Dispense Refill  . albuterol (PROVENTIL HFA;VENTOLIN HFA) 108 (90 BASE) MCG/ACT inhaler Inhale 2 puffs into the lungs every 6 (six) hours as needed for wheezing.    . budesonide-formoterol (SYMBICORT) 80-4.5 MCG/ACT inhaler Inhale 2 puffs into the lungs as needed.     . calcium carbonate (OS-CAL) 1250 (500 Ca) MG chewable tablet Chew 1 tablet by mouth daily.    . Cyanocobalamin (VITAMIN B-12 IJ) Inject as directed every 30 (thirty) days.    .Marland Kitchendiltiazem (DILACOR XR) 120 MG 24 hr capsule Take 120 mg by mouth daily.    .Marland Kitchendocusate sodium (COLACE) 100 MG capsule Take 100 mg by mouth 2 (two) times daily.    .Marland Kitchenipratropium-albuterol (DUONEB) 0.5-2.5 (3) MG/3ML SOLN Take 3 mLs by nebulization.    . meclizine (ANTIVERT)  12.5 MG tablet Take 12.5 mg by mouth 3 (three) times daily as needed for dizziness.    . Melatonin 3 MG TABS Take 3 mg by mouth at bedtime.    . metoprolol (LOPRESSOR) 50 MG tablet Take 1 tablet (50 mg total) by mouth 2 (two) times daily.    Marland Kitchen omeprazole (PRILOSEC) 20 MG capsule Take 20 mg by mouth 2 (two) times daily before a meal. Takes 20 mg am and 40 mg pm daily.    . potassium chloride (K-DUR,KLOR-CON) 10 MEQ tablet Take 10 mEq by mouth daily.    . Prenatal Vit-Fe Fumarate-FA (PRENAVITE MULTIPLE VITAMIN PO) Take by mouth daily. Reported on 01/27/2016    . tiotropium (SPIRIVA) 18 MCG inhalation capsule Place 18 mcg into inhaler and inhale daily.    Marland Kitchen triamcinolone cream (KENALOG) 0.1 % Apply 1 application topically 2 (two) times daily.     .  fluocinonide (LIDEX) 0.05 % external solution Apply 1 application topically 2 (two) times daily.     Marland Kitchen guaiFENesin (MUCINEX) 600 MG 12 hr tablet Take 600 mg by mouth 2 (two) times daily.     No current facility-administered medications for this visit.     PHYSICAL EXAMINATION:   BP (!) 147/47 (BP Location: Left Arm, Patient Position: Sitting)   Pulse 65   Temp 98.2 F (36.8 C) (Tympanic)   Wt 117 lb 6 oz (53.2 kg)   BMI 24.53 kg/m   Filed Weights   01/09/17 1508  Weight: 117 lb 6 oz (53.2 kg)    GENERAL: patient appears frail; wearing her oxygen. She is walking herself. Alert, no distress and comfortable.  Accompanied  By son. She is in a wheel chair.  EYES: no pallor or icterus OROPHARYNX: no thrush or ulceration; good dentition  NECK: supple, no masses felt LYMPH:  no palpable lymphadenopathy in the cervical, axillary or inguinal regions LUNGS: decreased air entry bilaterally. No wheeze or crackles HEART/CVS: regular rate & rhythm and no murmurs; No lower extremity edema ABDOMEN:abdomen soft, non-tender and normal bowel sounds Musculoskeletal:no cyanosis of digits and no clubbing  PSYCH: alert & oriented x 3 with fluent speech NEURO: no focal motor/sensory deficits SKIN:  no rashes or significant lesions  LABORATORY DATA:  I have reviewed the data as listed    Component Value Date/Time   NA 138 01/09/2017 1422   NA 136 12/29/2014 0827   K 3.9 01/09/2017 1422   K 4.0 12/29/2014 0827   CL 103 01/09/2017 1422   CL 102 12/29/2014 0827   CO2 29 01/09/2017 1422   CO2 28 12/29/2014 0827   GLUCOSE 101 (H) 01/09/2017 1422   GLUCOSE 115 (H) 12/29/2014 0827   BUN 16 01/09/2017 1422   BUN 15 12/29/2014 0827   CREATININE 1.02 (H) 01/09/2017 1422   CREATININE 1.23 12/29/2014 0827   CALCIUM 9.0 01/09/2017 1422   CALCIUM 8.8 12/29/2014 0827   PROT 6.7 01/09/2017 1422   PROT 6.3 (L) 12/01/2014 1355   ALBUMIN 3.7 01/09/2017 1422   ALBUMIN 3.2 (L) 12/01/2014 1355   AST 22  01/09/2017 1422   AST 14 (L) 12/01/2014 1355   ALT 18 01/09/2017 1422   ALT 22 12/01/2014 1355   ALKPHOS 80 01/09/2017 1422   ALKPHOS 100 12/01/2014 1355   BILITOT 0.3 01/09/2017 1422   BILITOT 0.3 12/01/2014 1355   GFRNONAA 51 (L) 01/09/2017 1422   GFRNONAA 45 (L) 12/29/2014 0827   GFRNONAA 42 (L) 08/13/2014 1020   GFRAA 59 (L) 01/09/2017  [place] (L) 12/29/2014 0827   GFRAA 49 (L) 08/13/2014 1020    No results found for: SPEP, UPEP  Lab Results  Component Value Date   WBC 6.9 01/09/2017   NEUTROABS 4.8 01/09/2017   HGB 11.4 (L) 01/09/2017   HCT 32.1 (L) 01/09/2017   MCV 90.6 01/09/2017   PLT 199 01/09/2017      Chemistry      Component Value Date/Time   NA 138 01/09/2017 1422   NA 136 12/29/2014 0827   K 3.9 01/09/2017 1422   K 4.0 12/29/2014 0827   CL 103 01/09/2017 1422   CL 102 12/29/2014 0827   CO2 29 01/09/2017 1422   CO2 28 12/29/2014 0827   BUN 16 01/09/2017 1422   BUN 15 12/29/2014 0827   CREATININE 1.02 (H) 01/09/2017 1422   CREATININE 1.23 12/29/2014 0827      Component Value Date/Time   CALCIUM 9.0 01/09/2017 1422   CALCIUM 8.8 12/29/2014 0827   ALKPHOS 80 01/09/2017 1422   ALKPHOS 100 12/01/2014 1355   AST 22 01/09/2017 1422   AST 14 (L) 12/01/2014 1355   ALT 18 01/09/2017 1422   ALT 22 12/01/2014 1355   BILITOT 0.3 01/09/2017 1422   BILITOT 0.3 12/01/2014 1355     IMPRESSION: 1. Significantly reduced size of the right upper lobe nodule, which now appears flattened. 2. Reduced size but slight increase in density of the associated sub solid opacity in the right upper lobe. This merits surveillance. 3. Other solid and sub solid nodules in the lungs appear stable. 4. Cystic lesion in the pancreatic tail, slowly enlarging over the last several years, and separate cystic lesion at the junction of the pancreatic body and tail, stable. These lesions were not hypermetabolic on prior PET-CT. The pancreatic tail lesion merit surveillance  for possible intraductal papillary mucinous neoplasm. 5. Coronary, aortic arch, and branch vessel atherosclerotic vascular disease. 6. Stable emphysema. 7. Left suprahilar radiation therapy related findings.   Electronically Signed   By: Van Clines M.D.   On: 12/12/2016 12:22  RADIOGRAPHIC STUDIES: I have personally reviewed the radiological images as listed and agreed with the findings in the report. No results found.   ASSESSMENT & PLAN:   Cancer of upper lobe of left lung ([place]) # NEW Right upper lobe lung nodule 11 x 8 mm in size s/p SBRT in end of July 2017. CT scan Jan 2018- improvement.   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 June 30th 2017- CT scan shows stable left lung scarring from radiation  # pancreatic cyst- noted however patient declines any further workup at this time.   # COPD/ Dr.Khan.   # follow up with me CT chest in 6 months/few days prior.    # I reviewed the blood work- with the patient in detail; also reviewed the imaging independently [as summarized above]; and with the patient in detail.   # 25 minutes face-to-face with the patient discussing the above plan of care; more than 50% of time spent on prognosis/ natural history; counseling and coordination.      Cammie Sickle, MD 01/10/2017 2:21 PM

## 2017-01-09 NOTE — Progress Notes (Signed)
Patient here today for follow up.   

## 2017-01-09 NOTE — Assessment & Plan Note (Addendum)
#   NEW Right upper lobe lung nodule 11 x 8 mm in size s/p SBRT in end of July 2017. CT scan Jan 2018- improvement.   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 June 30th 2017- CT scan shows stable left lung scarring from radiation  # pancreatic cyst- noted however patient declines any further workup at this time.   # COPD/ Dr.Khan.   # follow up with me CT chest in 6 months/few days prior.    # I reviewed the blood work- with the patient in detail; also reviewed the imaging independently [as summarized above]; and with the patient in detail.   # 25 minutes face-to-face with the patient discussing the above plan of care; more than 50% of time spent on prognosis/ natural history; counseling and coordination.

## 2017-02-28 ENCOUNTER — Encounter: Payer: Self-pay | Admitting: *Deleted

## 2017-02-28 ENCOUNTER — Emergency Department: Payer: Medicare Other

## 2017-02-28 ENCOUNTER — Emergency Department
Admission: EM | Admit: 2017-02-28 | Discharge: 2017-02-28 | Disposition: A | Discharge status: Home / Self Care | Payer: Medicare Other | Attending: Student in an Organized Health Care Education/Training Program | Admitting: Student in an Organized Health Care Education/Training Program

## 2017-02-28 DIAGNOSIS — J449 Chronic obstructive pulmonary disease, unspecified: Secondary | ICD-10-CM | POA: Insufficient documentation

## 2017-02-28 DIAGNOSIS — Z87891 Personal history of nicotine dependence: Secondary | ICD-10-CM | POA: Diagnosis not present

## 2017-02-28 DIAGNOSIS — R451 Restlessness and agitation: Secondary | ICD-10-CM | POA: Diagnosis present

## 2017-02-28 DIAGNOSIS — R251 Tremor, unspecified: Secondary | ICD-10-CM | POA: Insufficient documentation

## 2017-02-28 DIAGNOSIS — I10 Essential (primary) hypertension: Secondary | ICD-10-CM | POA: Insufficient documentation

## 2017-02-28 DIAGNOSIS — Z79899 Other long term (current) drug therapy: Secondary | ICD-10-CM | POA: Insufficient documentation

## 2017-02-28 LAB — CBC WITH DIFFERENTIAL/PLATELET
BASOS PCT: 1 %
Basophils Absolute: 0 10*3/uL (ref 0–0.1)
Eosinophils Absolute: 0.1 10*3/uL (ref 0–0.7)
Eosinophils Relative: 2 %
HEMATOCRIT: 32 % — AB (ref 35.0–47.0)
HEMOGLOBIN: 11.3 g/dL — AB (ref 12.0–16.0)
LYMPHS ABS: 1.1 10*3/uL (ref 1.0–3.6)
Lymphocytes Relative: 20 %
MCH: 32.4 pg (ref 26.0–34.0)
MCHC: 35.1 g/dL (ref 32.0–36.0)
MCV: 92.1 fL (ref 80.0–100.0)
MONOS PCT: 9 %
Monocytes Absolute: 0.5 10*3/uL (ref 0.2–0.9)
NEUTROS ABS: 3.7 10*3/uL (ref 1.4–6.5)
NEUTROS PCT: 68 %
Platelets: 185 10*3/uL (ref 150–440)
RBC: 3.48 MIL/uL — AB (ref 3.80–5.20)
RDW: 14.1 % (ref 11.5–14.5)
WBC: 5.3 10*3/uL (ref 3.6–11.0)

## 2017-02-28 LAB — COMPREHENSIVE METABOLIC PANEL
ALBUMIN: 3.7 g/dL (ref 3.5–5.0)
ALT: 21 U/L (ref 14–54)
ANION GAP: 8 (ref 5–15)
AST: 26 U/L (ref 15–41)
Alkaline Phosphatase: 68 U/L (ref 38–126)
BILIRUBIN TOTAL: 0.8 mg/dL (ref 0.3–1.2)
BUN: 9 mg/dL (ref 6–20)
CALCIUM: 9.3 mg/dL (ref 8.9–10.3)
CO2: 29 mmol/L (ref 22–32)
Chloride: 101 mmol/L (ref 101–111)
Creatinine, Ser: 0.82 mg/dL (ref 0.44–1.00)
GFR calc Af Amer: >60 mL/min (ref >60)
GFR calc non Af Amer: >60 mL/min (ref >60)
GLUCOSE: 107 mg/dL — AB (ref 65–99)
Potassium: 3 mmol/L — ABNORMAL LOW (ref 3.5–5.1)
Sodium: 138 mmol/L (ref 135–145)
TOTAL PROTEIN: 6.9 g/dL (ref 6.5–8.1)

## 2017-02-28 LAB — URINALYSIS, COMPLETE (UACMP) WITH MICROSCOPIC
BILIRUBIN URINE: NEGATIVE
Bacteria, UA: NONE SEEN
Glucose, UA: NEGATIVE mg/dL
HGB URINE DIPSTICK: NEGATIVE
Ketones, ur: 5 mg/dL — AB
Nitrite: NEGATIVE
PH: 6 (ref 5.0–8.0)
Protein, ur: NEGATIVE mg/dL
SPECIFIC GRAVITY, URINE: 1.013 (ref 1.005–1.030)

## 2017-02-28 LAB — TROPONIN I: Troponin I: <0.03 ng/mL (ref <0.03)

## 2017-02-28 MED ORDER — POTASSIUM CHLORIDE CRYS ER 20 MEQ PO TBCR
40.0000 meq | EXTENDED_RELEASE_TABLET | Freq: Once | ORAL | Status: AC
  Administered 2017-02-28: 40 meq via ORAL
  Filled 2017-02-28: qty 2

## 2017-02-28 NOTE — ED Notes (Signed)
Pt taken to car in wheelchair.

## 2017-02-28 NOTE — ED Provider Notes (Signed)
[facility] Emergency Department Provider Note    First MD Initiated Contact with Patient 02/28/17 1430     (approximate)  I have reviewed the triage vital signs and the nursing notes.   HISTORY  Chief Complaint Medication Reaction     HPI Bethany Lambert is a 83 y.o. female presents with chief complaint of weakness, restlessness and tremors for the past 2 days after starting a new medication. Patient has a history of COPD and is on 2 L to 3 L nasal cannula chronically. States that she was prescribed Daliresp and started this medication 2 days ago. States the symptoms have been progressive and development over the past 2 days. She did not take a dose today. She denies any numbness or tingling. Feels generally anxious. Denies any nausea or vomiting. No cough. States that shortness of breath is unchanged from her baseline. No fevers.   Past Medical History:  Diagnosis Date  . COPD (chronic obstructive pulmonary disease) ([place])   . Erythrocytosis   . GERD (gastroesophageal reflux disease)   . History of chemotherapy   . History of radiation therapy   . Hyperlipidemia   . Hypertension   . Kidney failure   . Lung cancer ([place])    squamous, stage 3 non small cell lung ca  . Polycythemia   . Psoriasis    Family History  Problem Relation Age of Onset  . Heart attack Father   . Cancer Son    Past Surgical History:  Procedure Laterality Date  . CATARACT EXTRACTION    . COLON RESECTION    . CT GUIDED BIOPSY  ([facility] HX)  11/19/2012   lung  . HERNIA REPAIR    . TOTAL ABDOMINAL HYSTERECTOMY     Patient Active Problem List   Diagnosis Date Noted  . COPD exacerbation ([place]) 09/09/2016  . GERD (gastroesophageal reflux disease) 09/09/2016  . HTN (hypertension) 09/09/2016  . Cancer of upper lobe of left lung ([place]) 07/06/2016  . Tachycardia 02/03/2013  . Dyspnea 02/03/2013      Prior to Admission medications   Medication Sig Start Date End Date  Taking? Authorizing Provider  albuterol (PROVENTIL HFA;VENTOLIN HFA) 108 (90 BASE) MCG/ACT inhaler Inhale 2 puffs into the lungs every 6 (six) hours as needed for wheezing.    Historical Provider, MD  budesonide-formoterol (SYMBICORT) 80-4.5 MCG/ACT inhaler Inhale 2 puffs into the lungs as needed.     Historical Provider, MD  calcium carbonate (OS-CAL) 1250 (500 Ca) MG chewable tablet Chew 1 tablet by mouth daily.    Historical Provider, MD  Cyanocobalamin (VITAMIN B-12 IJ) Inject as directed every 30 (thirty) days.    Historical Provider, MD  diltiazem (DILACOR XR) 120 MG 24 hr capsule Take 120 mg by mouth daily.    Historical Provider, MD  docusate sodium (COLACE) 100 MG capsule Take 100 mg by mouth 2 (two) times daily.    Historical Provider, MD  fluocinonide (LIDEX) 0.05 % external solution Apply 1 application topically 2 (two) times daily.  08/18/16   Historical Provider, MD  guaiFENesin (MUCINEX) 600 MG 12 hr tablet Take 600 mg by mouth 2 (two) times daily.    Historical Provider, MD  ipratropium-albuterol (DUONEB) 0.5-2.5 (3) MG/3ML SOLN Take 3 mLs by nebulization.    Historical Provider, MD  meclizine (ANTIVERT) 12.5 MG tablet Take 12.5 mg by mouth 3 (three) times daily as needed for dizziness.    Historical Provider, MD  Melatonin 3 MG TABS Take 3 mg  by mouth at bedtime.    Historical Provider, MD  metoprolol (LOPRESSOR) 50 MG tablet Take 1 tablet (50 mg total) by mouth 2 (two) times daily. 01/31/13   Wellington Hampshire, MD  omeprazole (PRILOSEC) 20 MG capsule Take 20 mg by mouth 2 (two) times daily before a meal. Takes 20 mg am and 40 mg pm daily.    Historical Provider, MD  potassium chloride (K-DUR,KLOR-CON) 10 MEQ tablet Take 10 mEq by mouth daily.    Historical Provider, MD  Prenatal Vit-Fe Fumarate-FA (PRENAVITE MULTIPLE VITAMIN PO) Take by mouth daily. Reported on 01/27/2016    Historical Provider, MD  tiotropium (SPIRIVA) 18 MCG inhalation capsule Place 18 mcg into inhaler and inhale  daily.    Historical Provider, MD  triamcinolone cream (KENALOG) 0.1 % Apply 1 application topically 2 (two) times daily.  06/16/16   Historical Provider, MD    Allergies Ciprofloxacin; Fluocinolone; and Norvasc [amlodipine besylate]    Social History Social History  Substance Use Topics  . Smoking status: Former Smoker    Packs/day: 2.00    Years: 40.00    Types: Cigarettes  . Smokeless tobacco: Never Used  . Alcohol use No    Review of Systems Patient denies headaches, rhinorrhea, blurry vision, numbness, shortness of breath, chest pain, edema, cough, abdominal pain, nausea, vomiting, diarrhea, dysuria, fevers, rashes or hallucinations unless otherwise stated above in HPI. ____________________________________________   PHYSICAL EXAM:  VITAL SIGNS: Vitals:   02/28/17 1200  BP: (!) 140/52  Pulse: 85  Resp: 18  Temp: 98.3 F (36.8 C)    Constitutional: Alert and oriented. Well appearing and in no acute distress. Eyes: Conjunctivae are normal. PERRL. EOMI. Head: Atraumatic. Nose: No congestion/rhinnorhea. Mouth/Throat: Mucous membranes are moist.  Oropharynx non-erythematous. Neck: No stridor. Painless ROM. No cervical spine tenderness to palpation Hematological/Lymphatic/Immunilogical: No cervical lymphadenopathy. Cardiovascular: Normal rate, regular rhythm. Grossly normal heart sounds.  Good peripheral circulation. Respiratory: Normal respiratory effort.  No retractions. Lungs with good air movement bilaterally, faint expiratory wheezing.   Gastrointestinal: Soft and nontender. No distention. No abdominal bruits. No CVA tenderness. Genitourinary:  Musculoskeletal: No lower extremity tenderness nor edema.  No joint effusions. Neurologic:  Normal speech and language. No gross focal neurologic deficits are appreciated. No gait instability.  Subtle resting tremor to BUE.  Skin:  Skin is warm, dry and intact. No rash noted. Psychiatric: Mood and affect are normal. Speech  and behavior are normal.  ____________________________________________   LABS (all labs ordered are listed, but only abnormal results are displayed)  Results for orders placed or performed during the hospital encounter of 02/28/17 (from the past 24 hour(s))  CBC with Differential/Platelet     Status: Abnormal   Collection Time: 02/28/17  4:10 PM  Result Value Ref Range   WBC 5.3 3.6 - 11.0 K/uL   RBC 3.48 (L) 3.80 - 5.20 MIL/uL   Hemoglobin 11.3 (L) 12.0 - 16.0 g/dL   HCT 32.0 (L) 35.0 - 47.0 %   MCV 92.1 80.0 - 100.0 fL   MCH 32.4 26.0 - 34.0 pg   MCHC 35.1 32.0 - 36.0 g/dL   RDW 14.1 11.5 - 14.5 %   Platelets 185 150 - 440 K/uL   Neutrophils Relative % 68 %   Neutro Abs 3.7 1.4 - 6.5 K/uL   Lymphocytes Relative 20 %   Lymphs Abs 1.1 1.0 - 3.6 K/uL   Monocytes Relative 9 %   Monocytes Absolute 0.5 0.2 - 0.9 K/uL   Eosinophils  Relative 2 %   Eosinophils Absolute 0.1 0 - 0.7 K/uL   Basophils Relative 1 %   Basophils Absolute 0.0 0 - 0.1 K/uL  Comprehensive metabolic panel     Status: Abnormal   Collection Time: 02/28/17  4:10 PM  Result Value Ref Range   Sodium 138 135 - 145 mmol/L   Potassium 3.0 (L) 3.5 - 5.1 mmol/L   Chloride 101 101 - 111 mmol/L   CO2 29 22 - 32 mmol/L   Glucose, Bld 107 (H) 65 - 99 mg/dL   BUN 9 6 - 20 mg/dL   Creatinine, Ser 0.82 0.44 - 1.00 mg/dL   Calcium 9.3 8.9 - 10.3 mg/dL   Total Protein 6.9 6.5 - 8.1 g/dL   Albumin 3.7 3.5 - 5.0 g/dL   AST 26 15 - 41 U/L   ALT 21 14 - 54 U/L   Alkaline Phosphatase 68 38 - 126 U/L   Total Bilirubin 0.8 0.3 - 1.2 mg/dL   GFR calc non Af Amer >60 >60 mL/min   GFR calc Af Amer >60 >60 mL/min   Anion gap 8 5 - 15  Troponin I     Status: None   Collection Time: 02/28/17  4:10 PM  Result Value Ref Range   Troponin I <0.03 <0.03 ng/mL  Urinalysis, Complete w Microscopic     Status: Abnormal   Collection Time: 02/28/17  4:10 PM  Result Value Ref Range   Color, Urine YELLOW (A) YELLOW   APPearance HAZY (A)  CLEAR   Specific Gravity, Urine 1.013 1.005 - 1.030   pH 6.0 5.0 - 8.0   Glucose, UA NEGATIVE NEGATIVE mg/dL   Hgb urine dipstick NEGATIVE NEGATIVE   Bilirubin Urine NEGATIVE NEGATIVE   Ketones, ur 5 (A) NEGATIVE mg/dL   Protein, ur NEGATIVE NEGATIVE mg/dL   Nitrite NEGATIVE NEGATIVE   Leukocytes, UA SMALL (A) NEGATIVE   RBC / HPF 0-5 0 - 5 RBC/hpf   WBC, UA 6-30 0 - 5 WBC/hpf   Bacteria, UA NONE SEEN NONE SEEN   Squamous Epithelial / LPF 0-5 (A) NONE SEEN   Mucous PRESENT    Hyaline Casts, UA PRESENT    ____________________________________________  EKG ED ECG REPORT I, Merlyn Lot, the attending physician, personally viewed and interpreted this ECG.   Date: 02/28/2017  EKG Time: 16:18  Rate: 85  Rhythm: normal EKG, normal sinus rhythm, unchanged from previous tracings  Axis: normal  Intervals:normal  ST&T Change: no st elevations  ____________________________________________  RADIOLOGY  I personally reviewed all radiographic images ordered to evaluate for the above acute complaints and reviewed radiology reports and findings.  These findings were personally discussed with the patient.  Please see medical record for radiology report. ____________________________________________   PROCEDURES  Procedure(s) performed:  Procedures    Critical Care performed: no ____________________________________________   INITIAL IMPRESSION / ASSESSMENT AND PLAN / ED COURSE  Pertinent labs & imaging results that were available during my care of the patient were reviewed by me and considered in my medical decision making (see chart for details).  DDX: medication reaction, copd, pna, chf, allergic reaction  Bethany Lambert is a 82 y.o. who presents to the ED with symptoms as described above. Patient arrives in no acute distress. No new hypoxia. Lung sounds are surprisingly clear. I will check baseline labs to evaluate for any evidence of electrolyte abnormality. We'll  check x-ray to evaluate for any pneumonia or worsening consolidation. Do suspect her symptoms are related  to side effects of her new medication are listed as none side effects.  Clinical Course as of Feb 29 1716  Tue Feb 28, 2017  1627 CXR appears stable.  [PR]  1716 Blood work is reassuring. Discussed suspect that her symptoms are secondary to medication. pneumonia. Vital signs remain stable. She has no focal neurodeficits of this at this time. Patient appears stable for discharged for follow-up with PCP regarding decision for continuation of the new medication or not.  Have discussed with the patient and available family all diagnostics and treatments performed thus far and all questions were answered to the best of my ability. The patient demonstrates understanding and agreement with plan.   [PR]    Clinical Course User Index [PR] Merlyn Lot, MD     ____________________________________________   FINAL CLINICAL IMPRESSION(S) / ED DIAGNOSES  Final diagnoses:  Restlessness  Tremulousness      NEW MEDICATIONS STARTED DURING THIS VISIT:  New Prescriptions   No medications on file     Note:  This document was prepared using Dragon voice recognition software and may include unintentional dictation errors.    Merlyn Lot, MD 02/28/17 [phone]

## 2017-02-28 NOTE — ED Triage Notes (Signed)
States she started taking Daliresp 2 weeks ago, states she believes she is having a reaction, states feeling shaky, denies any pain, hx of COPD on o2

## 2017-02-28 NOTE — Discharge Instructions (Signed)
Drink plenty of fluids. Return for any worsening shortness of breath, chest pain, numbness or tingling.

## 2017-03-03 ENCOUNTER — Encounter: Payer: Self-pay | Admitting: *Deleted

## 2017-06-27 ENCOUNTER — Ambulatory Visit
Admission: RE | Admit: 2017-06-27 | Discharge: 2017-06-27 | Disposition: A | Discharge status: Home / Self Care | Payer: Medicare Other | Source: Ambulatory Visit | Attending: Radiation Oncology | Admitting: Radiation Oncology

## 2017-06-27 DIAGNOSIS — C3411 Malignant neoplasm of upper lobe, right bronchus or lung: Secondary | ICD-10-CM | POA: Diagnosis present

## 2017-06-27 DIAGNOSIS — J439 Emphysema, unspecified: Secondary | ICD-10-CM | POA: Diagnosis not present

## 2017-06-27 DIAGNOSIS — I2510 Atherosclerotic heart disease of native coronary artery without angina pectoris: Secondary | ICD-10-CM | POA: Diagnosis not present

## 2017-06-27 DIAGNOSIS — K862 Cyst of pancreas: Secondary | ICD-10-CM | POA: Diagnosis not present

## 2017-06-27 DIAGNOSIS — R935 Abnormal findings on diagnostic imaging of other abdominal regions, including retroperitoneum: Secondary | ICD-10-CM | POA: Insufficient documentation

## 2017-06-27 DIAGNOSIS — I700 Atherosclerosis of aorta: Secondary | ICD-10-CM | POA: Insufficient documentation

## 2017-06-27 DIAGNOSIS — R590 Localized enlarged lymph nodes: Secondary | ICD-10-CM | POA: Insufficient documentation

## 2017-06-27 LAB — POCT I-STAT CREATININE: CREATININE: 1.1 mg/dL — AB (ref 0.44–1.00)

## 2017-06-27 MED ORDER — IOPAMIDOL (ISOVUE-300) INJECTION 61%
75.0000 mL | Freq: Once | INTRAVENOUS | Status: AC | PRN
  Administered 2017-06-27: 75 mL via INTRAVENOUS

## 2017-07-04 ENCOUNTER — Ambulatory Visit: Payer: Medicare Other | Admitting: Radiation Oncology

## 2017-07-07 ENCOUNTER — Other Ambulatory Visit: Payer: Medicare Other

## 2017-07-10 ENCOUNTER — Inpatient Hospital Stay: Payer: Medicare Other

## 2017-07-10 ENCOUNTER — Ambulatory Visit
Admission: RE | Admit: 2017-07-10 | Discharge: 2017-07-10 | Disposition: A | Discharge status: Home / Self Care | Payer: Medicare Other | Source: Ambulatory Visit | Attending: Radiation Oncology | Admitting: Radiation Oncology

## 2017-07-10 ENCOUNTER — Inpatient Hospital Stay: Payer: Medicare Other | Attending: Internal Medicine | Admitting: Internal Medicine

## 2017-07-10 ENCOUNTER — Other Ambulatory Visit: Payer: Self-pay

## 2017-07-10 ENCOUNTER — Other Ambulatory Visit: Payer: Self-pay | Admitting: *Deleted

## 2017-07-10 DIAGNOSIS — K219 Gastro-esophageal reflux disease without esophagitis: Secondary | ICD-10-CM | POA: Diagnosis not present

## 2017-07-10 DIAGNOSIS — J449 Chronic obstructive pulmonary disease, unspecified: Secondary | ICD-10-CM | POA: Insufficient documentation

## 2017-07-10 DIAGNOSIS — K862 Cyst of pancreas: Secondary | ICD-10-CM | POA: Insufficient documentation

## 2017-07-10 DIAGNOSIS — Z923 Personal history of irradiation: Secondary | ICD-10-CM | POA: Diagnosis not present

## 2017-07-10 DIAGNOSIS — Z87891 Personal history of nicotine dependence: Secondary | ICD-10-CM | POA: Insufficient documentation

## 2017-07-10 DIAGNOSIS — Z9221 Personal history of antineoplastic chemotherapy: Secondary | ICD-10-CM | POA: Insufficient documentation

## 2017-07-10 DIAGNOSIS — C3411 Malignant neoplasm of upper lobe, right bronchus or lung: Secondary | ICD-10-CM | POA: Diagnosis not present

## 2017-07-10 DIAGNOSIS — I10 Essential (primary) hypertension: Secondary | ICD-10-CM | POA: Insufficient documentation

## 2017-07-10 DIAGNOSIS — Z79899 Other long term (current) drug therapy: Secondary | ICD-10-CM | POA: Diagnosis not present

## 2017-07-10 DIAGNOSIS — E785 Hyperlipidemia, unspecified: Secondary | ICD-10-CM | POA: Diagnosis not present

## 2017-07-10 DIAGNOSIS — C3412 Malignant neoplasm of upper lobe, left bronchus or lung: Secondary | ICD-10-CM

## 2017-07-10 DIAGNOSIS — R911 Solitary pulmonary nodule: Secondary | ICD-10-CM | POA: Insufficient documentation

## 2017-07-10 DIAGNOSIS — K869 Disease of pancreas, unspecified: Secondary | ICD-10-CM | POA: Diagnosis not present

## 2017-07-10 DIAGNOSIS — L409 Psoriasis, unspecified: Secondary | ICD-10-CM | POA: Diagnosis not present

## 2017-07-10 DIAGNOSIS — Z90710 Acquired absence of both cervix and uterus: Secondary | ICD-10-CM | POA: Diagnosis not present

## 2017-07-10 LAB — CBC WITH DIFFERENTIAL/PLATELET
Basophils Absolute: 0.1 10*3/uL (ref 0–0.1)
Basophils Relative: 1 %
EOS ABS: 0.1 10*3/uL (ref 0–0.7)
Eosinophils Relative: 2 %
HCT: 28.6 % — ABNORMAL LOW (ref 35.0–47.0)
HEMOGLOBIN: 10.2 g/dL — AB (ref 12.0–16.0)
LYMPHS ABS: 0.8 10*3/uL — AB (ref 1.0–3.6)
Lymphocytes Relative: 15 %
MCH: 32.5 pg (ref 26.0–34.0)
MCHC: 35.6 g/dL (ref 32.0–36.0)
MCV: 91.3 fL (ref 80.0–100.0)
MONO ABS: 0.5 10*3/uL (ref 0.2–0.9)
MONOS PCT: 9 %
NEUTROS PCT: 73 %
Neutro Abs: 4.1 10*3/uL (ref 1.4–6.5)
Platelets: 192 10*3/uL (ref 150–440)
RBC: 3.13 MIL/uL — ABNORMAL LOW (ref 3.80–5.20)
RDW: 13.4 % (ref 11.5–14.5)
WBC: 5.6 10*3/uL (ref 3.6–11.0)

## 2017-07-10 LAB — BASIC METABOLIC PANEL
Anion gap: 8 (ref 5–15)
BUN: 12 mg/dL (ref 6–20)
CALCIUM: 8.5 mg/dL — AB (ref 8.9–10.3)
CHLORIDE: 99 mmol/L — AB (ref 101–111)
CO2: 27 mmol/L (ref 22–32)
CREATININE: 1.01 mg/dL — AB (ref 0.44–1.00)
GFR calc Af Amer: 59 mL/min — ABNORMAL LOW (ref >60)
GFR calc non Af Amer: 51 mL/min — ABNORMAL LOW (ref >60)
Glucose, Bld: 86 mg/dL (ref 65–99)
Potassium: 3.8 mmol/L (ref 3.5–5.1)
SODIUM: 134 mmol/L — AB (ref 135–145)

## 2017-07-10 NOTE — Progress Notes (Signed)
[place] OFFICE PROGRESS NOTE  Patient Care Team: Leonel Ramsay, MD as PCP - General (Infectious Diseases)   SUMMARY OF ONCOLOGIC HISTORY:  Oncology History   # DEC 2013- Squamous cell CA [ Stage IIIA; T4- invading mediastinum; CT Bx] s/p carbo-taxol with RT [stopped chemo x 2 cycles; Feb 26th 2014- poor tol];   # CT feb 2017- Stable Left lung changes; ~12x86mm RUL- June 30th 2017- s/p RT x5 Fx [Dr.Crystal Aug 2017].   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  She is here for follow-up Accompanied by her son- to review the results of her surveillance CT scan.  In the interim patient was evaluated by radiation oncology.   Patient continues to be on oxygen for COPD. Denies any worsening shortness of breath. She continues to have chronic shortness of breath or chronic cough. No worse. No hemoptysis no headaches no vision changes or double vision. Denies any weight loss or abdominal pain nausea vomiting.    REVIEW OF SYSTEMS:  A complete 10 point review of system is done which is negative except mentioned above/history of present illness.   PAST MEDICAL HISTORY :  Past Medical History:  Diagnosis Date  . COPD (chronic obstructive pulmonary disease) ([place])   . Erythrocytosis   . GERD (gastroesophageal reflux disease)   . History of chemotherapy   . History of radiation therapy   . Hyperlipidemia   . Hypertension   . Kidney failure   . Lung cancer ([place])    squamous, stage 3 non small cell lung ca  . Polycythemia   . Psoriasis     PAST SURGICAL HISTORY :   Past Surgical History:  Procedure Laterality Date  . CATARACT EXTRACTION    .  COLON RESECTION    . CT GUIDED BIOPSY  ([facility] HX)  11/19/2012   lung  . HERNIA REPAIR    . TOTAL ABDOMINAL HYSTERECTOMY      FAMILY HISTORY :   Family History  Problem Relation Age of Onset  . Heart attack Father   . Cancer Son     SOCIAL HISTORY:   Social History  Substance Use Topics  . Smoking status: Former Smoker    Packs/day: 2.00    Years: 40.00    Types: Cigarettes  . Smokeless tobacco: Never Used  . Alcohol use No    ALLERGIES:  is allergic to ciprofloxacin; fluocinolone; and norvasc [amlodipine besylate].  MEDICATIONS:  Current Outpatient Prescriptions  Medication Sig Dispense Refill  . albuterol (PROVENTIL HFA;VENTOLIN HFA) 108 (90 BASE) MCG/ACT inhaler Inhale 2 puffs into the lungs every 6 (six) hours as needed for wheezing.    . budesonide-formoterol (SYMBICORT) 80-4.5 MCG/ACT inhaler Inhale 2 puffs into the lungs as needed.     . calcium carbonate (OS-CAL) 1250 (500 Ca) MG chewable tablet Chew 1 tablet by mouth daily.    . Cyanocobalamin (VITAMIN B-12 IJ) Inject as directed every 30 (thirty) days.    Marland Kitchen diltiazem (DILACOR XR) 120 MG 24 hr capsule Take 120 mg by mouth daily.    Marland Kitchen docusate sodium (COLACE) 100 MG capsule Take 100 mg  by mouth 2 (two) times daily.    . fluocinonide (LIDEX) 0.05 % external solution Apply 1 application topically 2 (two) times daily.     Marland Kitchen guaiFENesin (MUCINEX) 600 MG 12 hr tablet Take 600 mg by mouth 2 (two) times daily.    Marland Kitchen ipratropium-albuterol (DUONEB) 0.5-2.5 (3) MG/3ML SOLN Take 3 mLs by nebulization.    . meclizine (ANTIVERT) 12.5 MG tablet Take 12.5 mg by mouth 3 (three) times daily as needed for dizziness.    . Melatonin 3 MG TABS Take 3 mg by mouth at bedtime.    . metoprolol (LOPRESSOR) 50 MG tablet Take 1 tablet (50 mg total) by mouth 2 (two) times daily.    Marland Kitchen omeprazole (PRILOSEC) 20 MG capsule Take 20 mg by mouth 2 (two) times daily before a meal. Takes 20 mg am and 40 mg pm daily.    . potassium chloride (K-DUR,KLOR-CON)  10 MEQ tablet Take 10 mEq by mouth daily.    . Prenatal Vit-Fe Fumarate-FA (PRENAVITE MULTIPLE VITAMIN PO) Take by mouth daily. Reported on 01/27/2016    . tiotropium (SPIRIVA) 18 MCG inhalation capsule Place 18 mcg into inhaler and inhale daily.    Marland Kitchen triamcinolone cream (KENALOG) 0.1 % Apply 1 application topically 2 (two) times daily.      No current facility-administered medications for this visit.     PHYSICAL EXAMINATION:   There were no vitals taken for this visit.  There were no vitals filed for this visit.  GENERAL: patient appears frail; wearing her oxygen. She is walking herself. Alert, no distress and comfortable.  Accompanied  By son. She is in a wheel chair.  EYES: no pallor or icterus OROPHARYNX: no thrush or ulceration; poor dentition  NECK: supple, no masses felt LYMPH:  no palpable lymphadenopathy in the cervical, axillary or inguinal regions LUNGS: decreased air entry bilaterally. No wheeze or crackles HEART/CVS: regular rate & rhythm and no murmurs; No lower extremity edema ABDOMEN:abdomen soft, non-tender and normal bowel sounds Musculoskeletal:no cyanosis of digits and no clubbing  PSYCH: alert & oriented x 3 with fluent speech NEURO: no focal motor/sensory deficits SKIN:  no rashes or significant lesions  LABORATORY DATA:  I have reviewed the data as listed    Component Value Date/Time   NA 134 (L) 07/10/2017 1116   NA 136 12/29/2014 0827   K 3.8 07/10/2017 1116   K 4.0 12/29/2014 0827   CL 99 (L) 07/10/2017 1116   CL 102 12/29/2014 0827   CO2 27 07/10/2017 1116   CO2 28 12/29/2014 0827   GLUCOSE 86 07/10/2017 1116   GLUCOSE 115 (H) 12/29/2014 0827   BUN 12 07/10/2017 1116   BUN 15 12/29/2014 0827   CREATININE 1.01 (H) 07/10/2017 1116   CREATININE 1.23 12/29/2014 0827   CALCIUM 8.5 (L) 07/10/2017 1116   CALCIUM 8.8 12/29/2014 0827   PROT 6.9 02/28/2017 1610   PROT 6.3 (L) 12/01/2014 1355   ALBUMIN 3.7 02/28/2017 1610   ALBUMIN 3.2 (L) 12/01/2014  1355   AST 26 02/28/2017 1610   AST 14 (L) 12/01/2014 1355   ALT 21 02/28/2017 1610   ALT 22 12/01/2014 1355   ALKPHOS 68 02/28/2017 1610   ALKPHOS 100 12/01/2014 1355   BILITOT 0.8 02/28/2017 1610   BILITOT 0.3 12/01/2014 1355   GFRNONAA 51 (L) 07/10/2017 1116   GFRNONAA 45 (L) 12/29/2014 0827   GFRNONAA 42 (L) 08/13/2014 1020   GFRAA 59 (L) 07/10/2017 1116   GFRAA 54 (L) 12/29/2014 0827  GFRAA 49 (L) 08/13/2014 1020    No results found for: SPEP, UPEP  Lab Results  Component Value Date   WBC 5.6 07/10/2017   NEUTROABS 4.1 07/10/2017   HGB 10.2 (L) 07/10/2017   HCT 28.6 (L) 07/10/2017   MCV 91.3 07/10/2017   PLT 192 07/10/2017      Chemistry      Component Value Date/Time   NA 134 (L) 07/10/2017 1116   NA 136 12/29/2014 0827   K 3.8 07/10/2017 1116   K 4.0 12/29/2014 0827   CL 99 (L) 07/10/2017 1116   CL 102 12/29/2014 0827   CO2 27 07/10/2017 1116   CO2 28 12/29/2014 0827   BUN 12 07/10/2017 1116   BUN 15 12/29/2014 0827   CREATININE 1.01 (H) 07/10/2017 1116   CREATININE 1.23 12/29/2014 0827      Component Value Date/Time   CALCIUM 8.5 (L) 07/10/2017 1116   CALCIUM 8.8 12/29/2014 0827   ALKPHOS 68 02/28/2017 1610   ALKPHOS 100 12/01/2014 1355   AST 26 02/28/2017 1610   AST 14 (L) 12/01/2014 1355   ALT 21 02/28/2017 1610   ALT 22 12/01/2014 1355   BILITOT 0.8 02/28/2017 1610   BILITOT 0.3 12/01/2014 1355     IMPRESSION: 1. Further decrease to resolution of previously described diminutive right upper lobe pulmonary nodule. Adjacent sub solid pulmonary nodule is also smaller but more solid-appearing today. This could be due to developing scar. Recommend attention on follow-up. 2.  Emphysema (ICD10-J43.9). 3. Other areas of solid and sub solid pulmonary nodularity are similar. Left suprahilar presumed radiation fibrosis is not significantly changed. 4. Similar size of mediastinal and borderline enlarged right hilar nodes. No progressive or developing  adenopathy. 5. Coronary artery atherosclerosis. Aortic Atherosclerosis (ICD10-I70.0). 6. Similar cystic lesions within the pancreatic tail and body. These remain indeterminate and warrant followup attention. 7. Gastric underdistention.  Suspect concurrent gastritis.   Electronically Signed   By: Abigail Miyamoto M.D.   On: 06/27/2017 17:15  RADIOGRAPHIC STUDIES: I have personally reviewed the radiological images as listed and agreed with the findings in the report. No results found.   ASSESSMENT & PLAN:   Cancer of upper lobe of left lung ([place]) # Right upper lobe lung nodule 11 x 8 mm in size s/p SBRT in end of July 2017.  CT- July 2018- Improving right upper lobe lung nodule [see discussion below]. Recommend a CT scan again in one year.   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 July 2018- CT scan shows stable left lung scarring from radiation; no evidence of progression/or disease recurrence.  # Pancreatic cyst-July 2018- CT scan stable. Would recommend an MRI of the abdomen in 6 months for further evaluation. Patient is a poor candidate for any invasive workup/or aggressive treatments. Patient understands.  # COPD- STABLE follows up with Dr.Khan.   # Follow-up in 6 months labs CA-19-9; MRI abdomen prior.   # I reviewed the blood work- with the patient in detail; also reviewed the imaging independently [as summarized above]; and with the patient in detail.   c; Dr.Fitzgerld; Dr.Khan     Cammie Sickle, MD 07/10/2017 1:18 PM

## 2017-07-10 NOTE — Assessment & Plan Note (Addendum)
#   Right upper lobe lung nodule 11 x 8 mm in size s/p SBRT in end of July 2017.  CT- July 2018- Improving right upper lobe lung nodule [see discussion below]. Recommend a CT scan again in one year.   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 July 2018- CT scan shows stable left lung scarring from radiation; no evidence of progression/or disease recurrence.  # Pancreatic cyst-July 2018- CT scan stable. Would recommend an MRI of the abdomen in 6 months for further evaluation. Patient is a poor candidate for any invasive workup/or aggressive treatments. Patient understands.  # COPD- STABLE follows up with Dr.Khan.   # Follow-up in 6 months labs CA-19-9; MRI abdomen prior.   # I reviewed the blood work- with the patient in detail; also reviewed the imaging independently [as summarized above]; and with the patient in detail.   c; Dr.Fitzgerld; Dr.Khan

## 2017-07-10 NOTE — Progress Notes (Signed)
Radiation Oncology Follow up Note  Name: Bethany Lambert   Date:   07/10/2017 MRN:  [id number] DOB: [birth year]    This 83 y.o. female presents to the clinic today for 1 year follow-up status post SB RT to her right upper lobe for.non-small cell lung cancer  REFERRING PROVIDER: Leonel Ramsay, MD  HPI: patient is an 83 year old female now seen out a year having completed SB RT to right upper lobe for stage non-small cell cell carcinoma.she is seen today in routine follow-up and is doing well. She is also multiple medical comorbidities although specifically denies productive cough hemoptysis chest tightness areashe recently had a CT scan of her chest showing further decrease in resolution of right upper lobe pulmonary nodule. Mediastinal and borderline-enlarged right hilar lymph node showed no progression of disease. Cystic lesions within the pancreatic tail and body were unchanged.  COMPLICATIONS OF TREATMENT: none  FOLLOW UP COMPLIANCE: keeps appointments   PHYSICAL EXAM:  There were no vitals taken for this visit. Well-developed wheelchair-bound female on nasal oxygen in NAD. Well-developed well-nourished patient in NAD. HEENT reveals PERLA, EOMI, discs not visualized.  Oral cavity is clear. No oral mucosal lesions are identified. Neck is clear without evidence of cervical or supraclavicular adenopathy. Lungs are clear to A&P. Cardiac examination is essentially unremarkable with regular rate and rhythm without murmur rub or thrill. Abdomen is benign with no organomegaly or masses noted. Motor sensory and DTR levels are equal and symmetric in the upper and lower extremities. Cranial nerves II through XII are grossly intact. Proprioception is intact. No peripheral adenopathy or edema is identified. No motor or sensory levels are noted. Crude visual fields are within normal range.  RADIOLOGY RESULTS: serial CT scans are reviewed and compatible with the above-stated findings  PLAN: present  time patient appears to be doing quite well with CT evidence of no progression of disease. I'm please were overall progress. I've asked to see her back in 1 year for follow-up in of ordered a CT scan with contrast prior to that visit. Patient Plans her treatment plan well. She knows to call with any concerns.  I would like to take this opportunity to thank you for allowing me to participate in the care of your patient.Armstead Peaks., MD

## 2017-10-13 ENCOUNTER — Other Ambulatory Visit: Payer: Self-pay | Admitting: Nurse Practitioner

## 2017-10-13 DIAGNOSIS — R0602 Shortness of breath: Secondary | ICD-10-CM

## 2017-10-18 ENCOUNTER — Ambulatory Visit
Admission: RE | Admit: 2017-10-18 | Discharge: 2017-10-18 | Disposition: A | Discharge status: Home / Self Care | Payer: Medicare Other | Source: Ambulatory Visit | Attending: Nurse Practitioner | Admitting: Nurse Practitioner

## 2017-10-18 DIAGNOSIS — R0602 Shortness of breath: Secondary | ICD-10-CM | POA: Insufficient documentation

## 2017-12-13 ENCOUNTER — Other Ambulatory Visit: Payer: Self-pay | Admitting: Internal Medicine

## 2018-01-10 ENCOUNTER — Ambulatory Visit
Admission: RE | Admit: 2018-01-10 | Discharge: 2018-01-10 | Disposition: A | Discharge status: Home / Self Care | Payer: Medicare Other | Source: Ambulatory Visit | Attending: Internal Medicine | Admitting: Internal Medicine

## 2018-01-10 DIAGNOSIS — C3412 Malignant neoplasm of upper lobe, left bronchus or lung: Secondary | ICD-10-CM | POA: Diagnosis present

## 2018-01-10 DIAGNOSIS — K862 Cyst of pancreas: Secondary | ICD-10-CM | POA: Diagnosis not present

## 2018-01-10 LAB — POCT I-STAT CREATININE: CREATININE: 1 mg/dL (ref 0.44–1.00)

## 2018-01-10 MED ORDER — GADOBENATE DIMEGLUMINE 529 MG/ML IV SOLN
15.0000 mL | Freq: Once | INTRAVENOUS | Status: AC | PRN
  Administered 2018-01-10: 11 mL via INTRAVENOUS

## 2018-01-12 ENCOUNTER — Inpatient Hospital Stay: Payer: Medicare Other | Attending: Internal Medicine

## 2018-01-12 ENCOUNTER — Encounter: Payer: Self-pay | Admitting: Internal Medicine

## 2018-01-12 ENCOUNTER — Inpatient Hospital Stay (HOSPITAL_BASED_OUTPATIENT_CLINIC_OR_DEPARTMENT_OTHER): Payer: Medicare Other | Admitting: Internal Medicine

## 2018-01-12 VITALS — BP 136/49 | HR 68 | Temp 97.3°F | Resp 16 | Wt 113.0 lb

## 2018-01-12 DIAGNOSIS — K863 Pseudocyst of pancreas: Secondary | ICD-10-CM | POA: Insufficient documentation

## 2018-01-12 DIAGNOSIS — I10 Essential (primary) hypertension: Secondary | ICD-10-CM | POA: Insufficient documentation

## 2018-01-12 DIAGNOSIS — C3491 Malignant neoplasm of unspecified part of right bronchus or lung: Secondary | ICD-10-CM | POA: Diagnosis not present

## 2018-01-12 DIAGNOSIS — J449 Chronic obstructive pulmonary disease, unspecified: Secondary | ICD-10-CM | POA: Insufficient documentation

## 2018-01-12 DIAGNOSIS — Z9221 Personal history of antineoplastic chemotherapy: Secondary | ICD-10-CM | POA: Insufficient documentation

## 2018-01-12 DIAGNOSIS — D649 Anemia, unspecified: Secondary | ICD-10-CM

## 2018-01-12 DIAGNOSIS — Z923 Personal history of irradiation: Secondary | ICD-10-CM

## 2018-01-12 DIAGNOSIS — L409 Psoriasis, unspecified: Secondary | ICD-10-CM | POA: Insufficient documentation

## 2018-01-12 DIAGNOSIS — D378 Neoplasm of uncertain behavior of other specified digestive organs: Secondary | ICD-10-CM | POA: Insufficient documentation

## 2018-01-12 DIAGNOSIS — C3412 Malignant neoplasm of upper lobe, left bronchus or lung: Secondary | ICD-10-CM | POA: Diagnosis present

## 2018-01-12 DIAGNOSIS — I2510 Atherosclerotic heart disease of native coronary artery without angina pectoris: Secondary | ICD-10-CM | POA: Insufficient documentation

## 2018-01-12 DIAGNOSIS — K862 Cyst of pancreas: Secondary | ICD-10-CM

## 2018-01-12 DIAGNOSIS — Z87891 Personal history of nicotine dependence: Secondary | ICD-10-CM | POA: Diagnosis not present

## 2018-01-12 LAB — COMPREHENSIVE METABOLIC PANEL
ALBUMIN: 3.4 g/dL — AB (ref 3.5–5.0)
ALT: 22 U/L (ref 14–54)
ANION GAP: 7 (ref 5–15)
AST: 26 U/L (ref 15–41)
Alkaline Phosphatase: 70 U/L (ref 38–126)
BILIRUBIN TOTAL: 0.5 mg/dL (ref 0.3–1.2)
BUN: 13 mg/dL (ref 6–20)
CO2: 30 mmol/L (ref 22–32)
Calcium: 8.8 mg/dL — ABNORMAL LOW (ref 8.9–10.3)
Chloride: 100 mmol/L — ABNORMAL LOW (ref 101–111)
Creatinine, Ser: 0.97 mg/dL (ref 0.44–1.00)
GFR calc Af Amer: >60 mL/min (ref >60)
GFR, EST NON AFRICAN AMERICAN: 53 mL/min — AB (ref >60)
Glucose, Bld: 97 mg/dL (ref 65–99)
POTASSIUM: 3.7 mmol/L (ref 3.5–5.1)
Sodium: 137 mmol/L (ref 135–145)
TOTAL PROTEIN: 6.4 g/dL — AB (ref 6.5–8.1)

## 2018-01-12 LAB — CBC WITH DIFFERENTIAL/PLATELET
BASOS PCT: 1 %
Basophils Absolute: 0.1 10*3/uL (ref 0–0.1)
Eosinophils Absolute: 0.2 10*3/uL (ref 0–0.7)
Eosinophils Relative: 4 %
HEMATOCRIT: 30.7 % — AB (ref 35.0–47.0)
Hemoglobin: 10.3 g/dL — ABNORMAL LOW (ref 12.0–16.0)
Lymphocytes Relative: 16 %
Lymphs Abs: 0.8 10*3/uL — ABNORMAL LOW (ref 1.0–3.6)
MCH: 31.7 pg (ref 26.0–34.0)
MCHC: 33.7 g/dL (ref 32.0–36.0)
MCV: 94.1 fL (ref 80.0–100.0)
Monocytes Absolute: 0.5 10*3/uL (ref 0.2–0.9)
Monocytes Relative: 9 %
NEUTROS ABS: 3.5 10*3/uL (ref 1.4–6.5)
Neutrophils Relative %: 70 %
Platelets: 195 10*3/uL (ref 150–440)
RBC: 3.26 MIL/uL — ABNORMAL LOW (ref 3.80–5.20)
RDW: 13.5 % (ref 11.5–14.5)
WBC: 5 10*3/uL (ref 3.6–11.0)

## 2018-01-12 LAB — IRON AND TIBC
IRON: 70 ug/dL (ref 28–170)
SATURATION RATIOS: 23 % (ref 10.4–31.8)
TIBC: 301 ug/dL (ref 250–450)
UIBC: 231 ug/dL

## 2018-01-12 LAB — FERRITIN: Ferritin: 43 ng/mL (ref 11–307)

## 2018-01-12 NOTE — Progress Notes (Signed)
[place] OFFICE PROGRESS NOTE  Patient Care Team: Leonel Ramsay, MD as PCP - General (Infectious Diseases)   SUMMARY OF ONCOLOGIC HISTORY:  Oncology History   # DEC 2013- Squamous cell CA [ Stage IIIA; T4- invading mediastinum; CT Bx] s/p carbo-taxol with RT [stopped chemo x 2 cycles; Feb 26th 2014- poor tol];   # CT feb 2017- Stable Left lung changes; ~12x1mm RUL- June 30th 2017- s/p RT x5 Fx [Dr.Crystal Aug 2017].   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  She is here for follow-up Accompanied by her son.   She is seen to review the results of her MRI of the abdomen that was ordered for pancreatic cyst.  Patient denies any unusual abdominal pain nausea vomiting.  Denies any significant weight loss.  Denies any blood in stools or black colored stools.  Patient notes to have chronic shortness of breath for which she is on oxygen.  Not getting any worse.  REVIEW OF SYSTEMS:  A complete 10 point review of system is done which is negative except mentioned above/history of present illness.   PAST MEDICAL HISTORY :  Past Medical History:  Diagnosis Date  . COPD (chronic obstructive pulmonary disease) ([place])   . Erythrocytosis   . GERD (gastroesophageal reflux disease)   . History of chemotherapy   . History of radiation therapy   . Hyperlipidemia   . Hypertension   . Kidney failure   . Lung cancer ([place])    squamous, stage 3 non small cell lung ca  . Polycythemia   . Psoriasis     PAST SURGICAL HISTORY :   Past Surgical History:  Procedure Laterality Date  .  CATARACT EXTRACTION    . COLON RESECTION    . CT GUIDED BIOPSY  ([facility] HX)  11/19/2012   lung  . HERNIA REPAIR    . TOTAL ABDOMINAL HYSTERECTOMY      FAMILY HISTORY :   Family History  Problem Relation Age of Onset  . Heart attack Father   . Cancer Son     SOCIAL HISTORY:   Social History   Tobacco Use  . Smoking status: Former Smoker    Packs/day: 2.00    Years: 40.00    Pack years: 80.00    Types: Cigarettes  . Smokeless tobacco: Never Used  Substance Use Topics  . Alcohol use: No    Alcohol/week: 0.0 oz  . Drug use: No    ALLERGIES:  is allergic to ciprofloxacin; fluocinolone; and norvasc [amlodipine besylate].  MEDICATIONS:  Current Outpatient Medications  Medication Sig Dispense Refill  . albuterol (PROVENTIL HFA;VENTOLIN HFA) 108 (90 BASE) MCG/ACT inhaler Inhale 2 puffs into the lungs every 6 (six) hours as needed for wheezing.    . calcium carbonate (OS-CAL) 1250 (500 Ca) MG chewable tablet Chew 1 tablet by mouth daily.    . Cyanocobalamin (VITAMIN B-12 IJ) Inject as directed every 30 (thirty) days.    Marland Kitchen diltiazem (DILACOR XR) 120 MG 24 hr capsule Take 120 mg by mouth daily.    Marland Kitchen  docusate sodium (COLACE) 100 MG capsule Take 100 mg by mouth 2 (two) times daily.    . fluocinonide (LIDEX) 0.05 % external solution Apply 1 application topically 2 (two) times daily.     Marland Kitchen guaiFENesin (MUCINEX) 600 MG 12 hr tablet Take 600 mg by mouth 2 (two) times daily.    Marland Kitchen ipratropium-albuterol (DUONEB) 0.5-2.5 (3) MG/3ML SOLN Take 3 mLs by nebulization.    . meclizine (ANTIVERT) 12.5 MG tablet Take 12.5 mg by mouth 3 (three) times daily as needed for dizziness.    . Melatonin 3 MG TABS Take 3 mg by mouth at bedtime.    . metoprolol (LOPRESSOR) 50 MG tablet Take 1 tablet (50 mg total) by mouth 2 (two) times daily.    Marland Kitchen omeprazole (PRILOSEC) 20 MG capsule Take 20 mg by mouth 2 (two) times daily before a meal. Takes 20 mg am and 40 mg pm daily.    . potassium chloride (K-DUR,KLOR-CON)  10 MEQ tablet Take 10 mEq by mouth daily.    . Prenatal Vit-Fe Fumarate-FA (PRENAVITE MULTIPLE VITAMIN PO) Take by mouth daily. Reported on 01/27/2016    . SYMBICORT 80-4.5 MCG/ACT inhaler USE TWO PUFFS TWO TIMES A DAY 20.4 Inhaler 2  . tiotropium (SPIRIVA) 18 MCG inhalation capsule Place 18 mcg into inhaler and inhale daily.    Marland Kitchen triamcinolone cream (KENALOG) 0.1 % Apply 1 application topically 2 (two) times daily.      No current facility-administered medications for this visit.     PHYSICAL EXAMINATION:   BP (!) 136/49 (BP Location: Left Arm, Patient Position: Sitting)   Pulse 68   Temp (!) 97.3 F (36.3 C) (Tympanic)   Resp 16   Wt 113 lb (51.3 kg)   BMI 23.62 kg/m   Filed Weights   01/12/18 1144  Weight: 113 lb (51.3 kg)    GENERAL: patient appears frail; wearing her oxygen. She is walking herself. Alert, no distress and comfortable.  Accompanied  By son. She is in a wheel chair.  EYES: no pallor or icterus OROPHARYNX: no thrush or ulceration; poor dentition  NECK: supple, no masses felt LYMPH:  no palpable lymphadenopathy in the cervical, axillary or inguinal regions LUNGS: decreased air entry bilaterally. No wheeze or crackles HEART/CVS: regular rate & rhythm and no murmurs; No lower extremity edema ABDOMEN:abdomen soft, non-tender and normal bowel sounds Musculoskeletal:no cyanosis of digits and no clubbing  PSYCH: alert & oriented x 3 with fluent speech NEURO: no focal motor/sensory deficits SKIN:  no rashes or significant lesions  LABORATORY DATA:  I have reviewed the data as listed    Component Value Date/Time   NA 137 01/12/2018 1117   NA 136 12/29/2014 0827   K 3.7 01/12/2018 1117   K 4.0 12/29/2014 0827   CL 100 (L) 01/12/2018 1117   CL 102 12/29/2014 0827   CO2 30 01/12/2018 1117   CO2 28 12/29/2014 0827   GLUCOSE 97 01/12/2018 1117   GLUCOSE 115 (H) 12/29/2014 0827   BUN 13 01/12/2018 1117   BUN 15 12/29/2014 0827   CREATININE 0.97 01/12/2018 1117    CREATININE 1.23 12/29/2014 0827   CALCIUM 8.8 (L) 01/12/2018 1117   CALCIUM 8.8 12/29/2014 0827   PROT 6.4 (L) 01/12/2018 1117   PROT 6.3 (L) 12/01/2014 1355   ALBUMIN 3.4 (L) 01/12/2018 1117   ALBUMIN 3.2 (L) 12/01/2014 1355   AST 26 01/12/2018 1117   AST 14 (L) 12/01/2014 1355   ALT 22 01/12/2018 1117  ALT 22 12/01/2014 1355   ALKPHOS 70 01/12/2018 1117   ALKPHOS 100 12/01/2014 1355   BILITOT 0.5 01/12/2018 1117   BILITOT 0.3 12/01/2014 1355   GFRNONAA 53 (L) 01/12/2018 1117   GFRNONAA 45 (L) 12/29/2014 0827   GFRNONAA 42 (L) 08/13/2014 1020   GFRAA >60 01/12/2018 1117   GFRAA 54 (L) 12/29/2014 0827   GFRAA 49 (L) 08/13/2014 1020    No results found for: SPEP, UPEP  Lab Results  Component Value Date   WBC 5.0 01/12/2018   NEUTROABS 3.5 01/12/2018   HGB 10.3 (L) 01/12/2018   HCT 30.7 (L) 01/12/2018   MCV 94.1 01/12/2018   PLT 195 01/12/2018      Chemistry      Component Value Date/Time   NA 137 01/12/2018 1117   NA 136 12/29/2014 0827   K 3.7 01/12/2018 1117   K 4.0 12/29/2014 0827   CL 100 (L) 01/12/2018 1117   CL 102 12/29/2014 0827   CO2 30 01/12/2018 1117   CO2 28 12/29/2014 0827   BUN 13 01/12/2018 1117   BUN 15 12/29/2014 0827   CREATININE 0.97 01/12/2018 1117   CREATININE 1.23 12/29/2014 0827      Component Value Date/Time   CALCIUM 8.8 (L) 01/12/2018 1117   CALCIUM 8.8 12/29/2014 0827   ALKPHOS 70 01/12/2018 1117   ALKPHOS 100 12/01/2014 1355   AST 26 01/12/2018 1117   AST 14 (L) 12/01/2014 1355   ALT 22 01/12/2018 1117   ALT 22 12/01/2014 1355   BILITOT 0.5 01/12/2018 1117   BILITOT 0.3 12/01/2014 1355     IMPRESSION: 1. Further decrease to resolution of previously described diminutive right upper lobe pulmonary nodule. Adjacent sub solid pulmonary nodule is also smaller but more solid-appearing today. This could be due to developing scar. Recommend attention on follow-up. 2.  Emphysema (ICD10-J43.9). 3. Other areas of solid and sub  solid pulmonary nodularity are similar. Left suprahilar presumed radiation fibrosis is not significantly changed. 4. Similar size of mediastinal and borderline enlarged right hilar nodes. No progressive or developing adenopathy. 5. Coronary artery atherosclerosis. Aortic Atherosclerosis (ICD10-I70.0). 6. Similar cystic lesions within the pancreatic tail and body. These remain indeterminate and warrant followup attention. 7. Gastric underdistention.  Suspect concurrent gastritis.   Electronically Signed   By: Abigail Miyamoto M.D.   On: 06/27/2017 17:15  RADIOGRAPHIC STUDIES: I have personally reviewed the radiological images as listed and agreed with the findings in the report. No results found.  IMPRESSION: 1. Multiple small non-aggressive appearing cystic lesions scattered throughout the pancreas, largest of which is in the tail of the pancreas measuring 12 x 12 x 15 mm. These are favored to be benign, likely small pancreatic pseudocysts. Repeat MRI of the abdomen with and without IV gadolinium with MRCP is recommended in 2 years to assess for stability of these lesions. This recommendation follows ACR consensus guidelines: Management of Incidental Pancreatic Cysts: A White Paper of the ACR Incidental Findings Committee. [place] 5284;13:244-010.   Electronically Signed   By: Vinnie Langton M.D.   On: 01/10/2018 13:26 ASSESSMENT & PLAN:   Cancer of upper lobe of left lung ([place]) # Right upper lobe lung nodule 11 x 8 mm in size s/p SBRT in end of July 2017.  CT- July 2018- Improving right upper lobe lung nodule [see discussion below]. Recommend a CT scan again in 6 months from now.    # stage IIIA/T4-invading the mediastinum/squamous cell carcinoma of the  left lung- status post chemoradiation 2014. July 2018- CT scan shows stable left lung scarring from radiation; no evidence of progression/or disease recurrence.  # Pancreatic cyst-February 2019 MRI abdomen shows  multiple cysts in the pancreas-pseudocyst not concerning for any obvious malignancy.  Recommended MRI again in February 2021 changes typically.  # COPD- STABLE follows up with Dr.Khan.   # mild anemia- Hb 10- ? Etiology. Check iron studies.  Chronic.  Monitor for now.  # I reviewed the blood work- with the patient in detail; also reviewed the imaging independently [as summarized above]; and with the patient in detail.   # follow up in 6 months/ CT prior chest/labs  c; Dr.Fitzgerld; Dr.Khan     Cammie Sickle, MD 01/13/2018 8:38 AM

## 2018-01-12 NOTE — Assessment & Plan Note (Addendum)
#   Right upper lobe lung nodule 11 x 8 mm in size s/p SBRT in end of July 2017.  CT- July 2018- Improving right upper lobe lung nodule [see discussion below]. Recommend a CT scan again in 6 months from now.    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 July 2018- CT scan shows stable left lung scarring from radiation; no evidence of progression/or disease recurrence.  # Pancreatic cyst-February 2019 MRI abdomen shows multiple cysts in the pancreas-pseudocyst not concerning for any obvious malignancy.  Recommended MRI again in February 2021 changes typically.  # COPD- STABLE follows up with Dr.Khan.   # mild anemia- Hb 10- ? Etiology. Check iron studies.  Chronic.  Monitor for now.  # I reviewed the blood work- with the patient in detail; also reviewed the imaging independently [as summarized above]; and with the patient in detail.   # follow up in 6 months/ CT prior chest/labs  c; Dr.Fitzgerld; Dr.Khan

## 2018-01-13 LAB — CANCER ANTIGEN 19-9: CA 19-9: 21 U/mL (ref 0–35)

## 2018-03-08 ENCOUNTER — Encounter: Payer: Self-pay | Admitting: Internal Medicine

## 2018-03-08 ENCOUNTER — Ambulatory Visit: Payer: Medicare Other | Admitting: Internal Medicine

## 2018-03-08 VITALS — BP 140/80 | HR 76 | Resp 16 | Ht <= 58 in | Wt 114.6 lb

## 2018-03-08 DIAGNOSIS — R0602 Shortness of breath: Secondary | ICD-10-CM

## 2018-03-08 DIAGNOSIS — C3412 Malignant neoplasm of upper lobe, left bronchus or lung: Secondary | ICD-10-CM | POA: Diagnosis not present

## 2018-03-08 DIAGNOSIS — K219 Gastro-esophageal reflux disease without esophagitis: Secondary | ICD-10-CM | POA: Diagnosis not present

## 2018-03-08 DIAGNOSIS — J449 Chronic obstructive pulmonary disease, unspecified: Secondary | ICD-10-CM

## 2018-03-08 NOTE — Patient Instructions (Signed)

## 2018-03-08 NOTE — Progress Notes (Signed)
[facility] [place], [place] [address]  Pulmonary Sleep Medicine   Office Visit Note  Patient Name: Bethany [facility] DOB: 19-Aug-1934 MRN [id number]  Date of Service: 03/08/2018  Complaints/HPI:  She has been basically doing fairly well has not had any flare-ups severe COPD.  She is not any patient is to [facility].  She did have specific question regarding her breathing and possible dental extraction.  The patient has rotten teeth and she says that she wants them taken out but she is not sure if a dentist will take them out.  She is obviously at increased risk for complications what I aspirate that she see the dentist if this can be done without general anesthesia and then it would be possible for her to have the procedure done however with general anesthesia would be a problem more than likely for  ROS  General: (-) fever, (-) chills, (-) night sweats, (-) weakness Skin: (-) rashes, (-) itching,. Eyes: (-) visual changes, (-) redness, (-) itching. Nose and Sinuses: (-) nasal stuffiness or itchiness, (-) postnasal drip, (-) nosebleeds, (-) sinus trouble. Mouth and Throat: (-) sore throat, (-) hoarseness. Neck: (-) swollen glands, (-) enlarged thyroid, (-) neck pain. Respiratory: - cough, (-) bloody sputum, - shortness of breath, - wheezing. Cardiovascular: - ankle swelling, (-) chest pain. Lymphatic: (-) lymph node enlargement. Neurologic: (-) numbness, (-) tingling. Psychiatric: (-) anxiety, (-) depression   Current Medication: Outpatient Encounter Medications as of 03/08/2018  Medication Sig  . albuterol (PROVENTIL HFA;VENTOLIN HFA) 108 (90 BASE) MCG/ACT inhaler Inhale 2 puffs into the lungs every 6 (six) hours as needed for wheezing.  . calcium carbonate (OS-CAL) 1250 (500 Ca) MG chewable tablet Chew 1 tablet by mouth daily.  . Cyanocobalamin (VITAMIN B-12 IJ) Inject as directed every 30 (thirty) days.  Marland Kitchen diltiazem (DILACOR XR) 120 MG 24 hr capsule Take  120 mg by mouth daily.  Marland Kitchen docusate sodium (COLACE) 100 MG capsule Take 100 mg by mouth 2 (two) times daily.  . fluocinonide (LIDEX) 0.05 % external solution Apply 1 application topically 2 (two) times daily.   Marland Kitchen guaiFENesin (MUCINEX) 600 MG 12 hr tablet Take 600 mg by mouth 2 (two) times daily.  Marland Kitchen ipratropium-albuterol (DUONEB) 0.5-2.5 (3) MG/3ML SOLN Take 3 mLs by nebulization.  . Melatonin 3 MG TABS Take 3 mg by mouth at bedtime.  . metoprolol (LOPRESSOR) 50 MG tablet Take 1 tablet (50 mg total) by mouth 2 (two) times daily.  Marland Kitchen omeprazole (PRILOSEC) 20 MG capsule Take 20 mg by mouth 2 (two) times daily before a meal. Takes 20 mg am and 40 mg pm daily.  . potassium chloride (K-DUR,KLOR-CON) 10 MEQ tablet Take 10 mEq by mouth daily.  . Prenatal Vit-Fe Fumarate-FA (PRENAVITE MULTIPLE VITAMIN PO) Take by mouth daily. Reported on 01/27/2016  . SYMBICORT 80-4.5 MCG/ACT inhaler USE TWO PUFFS TWO TIMES A DAY  . tiotropium (SPIRIVA) 18 MCG inhalation capsule Place 18 mcg into inhaler and inhale daily.  Marland Kitchen triamcinolone cream (KENALOG) 0.1 % Apply 1 application topically 2 (two) times daily.   . meclizine (ANTIVERT) 12.5 MG tablet Take 12.5 mg by mouth 3 (three) times daily as needed for dizziness.   No facility-administered encounter medications on file as of 03/08/2018.     Surgical History: Past Surgical History:  Procedure Laterality Date  . CATARACT EXTRACTION    . COLON RESECTION    . CT GUIDED BIOPSY  ([facility] HX)  11/19/2012   lung  .  HERNIA REPAIR    . TOTAL ABDOMINAL HYSTERECTOMY      Medical History: Past Medical History:  Diagnosis Date  . COPD (chronic obstructive pulmonary disease) ([place])   . Erythrocytosis   . GERD (gastroesophageal reflux disease)   . History of chemotherapy   . History of radiation therapy   . Hyperlipidemia   . Hypertension   . Kidney failure   . Lung cancer ([place])    squamous, stage 3 non small cell lung ca  . Polycythemia   . Psoriasis     Family  History: Family History  Problem Relation Age of Onset  . Heart attack Father   . Cancer Son     Social History: Social History   Socioeconomic History  . Marital status: Widowed    Spouse name: Not on file  . Number of children: Not on file  . Years of education: Not on file  . Highest education level: Not on file  Occupational History  . Not on file  Social Needs  . Financial resource strain: Not on file  . Food insecurity:    Worry: Not on file    Inability: Not on file  . Transportation needs:    Medical: Not on file    Non-medical: Not on file  Tobacco Use  . Smoking status: Former Smoker    Packs/day: 2.00    Years: 40.00    Pack years: 80.00    Types: Cigarettes  . Smokeless tobacco: Never Used  Substance and Sexual Activity  . Alcohol use: No    Alcohol/week: 0.0 oz  . Drug use: No  . Sexual activity: Not on file  Lifestyle  . Physical activity:    Days per week: Not on file    Minutes per session: Not on file  . Stress: Not on file  Relationships  . Social connections:    Talks on phone: Not on file    Gets together: Not on file    Attends religious service: Not on file    Active member of club or organization: Not on file    Attends meetings of clubs or organizations: Not on file    Relationship status: Not on file  . Intimate partner violence:    Fear of current or ex partner: Not on file    Emotionally abused: Not on file    Physically abused: Not on file    Forced sexual activity: Not on file  Other Topics Concern  . Not on file  Social History Narrative  . Not on file    Vital Signs: Blood pressure 140/80, pulse 76, resp. rate 16, height _0  (1.473 m), weight 114 lb 9.6 oz (52 kg), SpO2 96 %.  Examination: General Appearance: The patient is well-developed, well-nourished, and in no distress. Skin: Gross inspection of skin unremarkable. Head: normocephalic, no gross deformities. Eyes: no gross deformities noted. ENT: ears appear  grossly normal no exudates. Neck: Supple. No thyromegaly. No LAD. Respiratory: no rhonchi noted. Cardiovascular: Normal S1 and S2 without murmur or rub. Extremities: No cyanosis. pulses are equal. Neurologic: Alert and oriented. No involuntary movements.  LABS: Recent Results (from the past 2160 hour(s))  I-STAT creatinine     Status: None   Collection Time: 01/10/18 11:15 AM  Result Value Ref Range   Creatinine, Ser 1.00 0.44 - 1.00 mg/dL  CBC with Differential/Platelet     Status: Abnormal   Collection Time: 01/12/18 11:17 AM  Result Value Ref Range   WBC 5.0  3.6 - 11.0 K/uL   RBC 3.26 (L) 3.80 - 5.20 MIL/uL   Hemoglobin 10.3 (L) 12.0 - 16.0 g/dL   HCT 30.7 (L) 35.0 - 47.0 %   MCV 94.1 80.0 - 100.0 fL   MCH 31.7 26.0 - 34.0 pg   MCHC 33.7 32.0 - 36.0 g/dL   RDW 13.5 11.5 - 14.5 %   Platelets 195 150 - 440 K/uL   Neutrophils Relative % 70 %   Neutro Abs 3.5 1.4 - 6.5 K/uL   Lymphocytes Relative 16 %   Lymphs Abs 0.8 (L) 1.0 - 3.6 K/uL   Monocytes Relative 9 %   Monocytes Absolute 0.5 0.2 - 0.9 K/uL   Eosinophils Relative 4 %   Eosinophils Absolute 0.2 0 - 0.7 K/uL   Basophils Relative 1 %   Basophils Absolute 0.1 0 - 0.1 K/uL    Comment: Performed at [facility], [address] [place], [place] [address]  Comprehensive metabolic panel     Status: Abnormal   Collection Time: 01/12/18 11:17 AM  Result Value Ref Range   Sodium 137 135 - 145 mmol/L   Potassium 3.7 3.5 - 5.1 mmol/L   Chloride 100 (L) 101 - 111 mmol/L   CO2 30 22 - 32 mmol/L   Glucose, Bld 97 65 - 99 mg/dL   BUN 13 6 - 20 mg/dL   Creatinine, Ser 0.97 0.44 - 1.00 mg/dL   Calcium 8.8 (L) 8.9 - 10.3 mg/dL   Total Protein 6.4 (L) 6.5 - 8.1 g/dL   Albumin 3.4 (L) 3.5 - 5.0 g/dL   AST 26 15 - 41 U/L   ALT 22 14 - 54 U/L   Alkaline Phosphatase 70 38 - 126 U/L   Total Bilirubin 0.5 0.3 - 1.2 mg/dL   GFR calc non Af Amer 53 (L) >60 mL/min   GFR calc Af Amer >60 >60 mL/min    Comment: (NOTE) The eGFR  has been calculated using the CKD EPI equation. This calculation has not been validated in all clinical situations. eGFR's persistently <60 mL/min signify possible Chronic Kidney Disease.    Anion gap 7 5 - 15    Comment: Performed at [facility], [address] [place], [place] [address]  Cancer antigen 19-9     Status: None   Collection Time: 01/12/18 11:17 AM  Result Value Ref Range   CA 19-9 21 0 - 35 U/mL    Comment: (NOTE) Roche Diagnostics Electrochemiluminescence Immunoassay (ECLIA) Values obtained with different assay methods or kits cannot be used interchangeably.  Results cannot be interpreted as absolute evidence of the presence or absence of malignant disease. Performed At: [facility] [place], [place] [id number] Rush Farmer MD [id number] Performed at [facility], [address] [place], [place] [address]   Iron and TIBC     Status: None   Collection Time: 01/12/18  1:04 PM  Result Value Ref Range   Iron 70 28 - 170 ug/dL   TIBC 301 250 - 450 ug/dL   Saturation Ratios 23 10.4 - 31.8 %   UIBC 231 ug/dL    Comment: Performed at [facility], [address] [place], [place] [address]  Ferritin     Status: None   Collection Time: 01/12/18  1:04 PM  Result Value Ref Range   Ferritin 43 11 - 307 ng/mL    Comment: Performed at [facility], [address], [place], [place] [address]  Radiology: Mr Abdomen W Wo Contrast  Result Date: 01/10/2018 CLINICAL DATA:  84 year old female with history of cystic lesion in the pancreatic body and tail. Followup study. EXAM: MRI ABDOMEN WITHOUT AND WITH CONTRAST TECHNIQUE: Multiplanar multisequence MR imaging of the abdomen was performed both before and after the administration of intravenous contrast. CONTRAST:  52m MULTIHANCE GADOBENATE DIMEGLUMINE 529 MG/ML IV SOLN COMPARISON:  None. FINDINGS: Lower chest: Unremarkable. Hepatobiliary: No cystic or solid hepatic  lesions. No intra or extrahepatic biliary ductal dilatation. Gallbladder is normal in appearance. Pancreas: There are several small T1 hypointense, T2 hyperintense, nonenhancing lesions in the pancreas, largest of which measures 12 x 12 x 15 mm in the tail of the pancreas (axial image 11 of series 9 and coronal image 13 of series 2). None of these lesions appear to communicate with the main pancreatic duct on MRCP images, and the pancreatic duct is normal in caliber. No peripancreatic fluid or inflammatory changes. Spleen:  Unremarkable. Adrenals/Urinary Tract: Bilateral kidneys and bilateral adrenal glands are normal in appearance. No hydroureteronephrosis. Stomach/Bowel: Visualized portions are unremarkable. Vascular/Lymphatic: Aortic atherosclerosis, without evidence of aneurysm or dissection in the abdominal vasculature. No lymphadenopathy noted in the abdomen. Other: No significant volume of ascites noted in the visualized portions of the abdomen. Musculoskeletal: No aggressive appearing osseous lesions are noted in the visualized portions of the skeleton. IMPRESSION: 1. Multiple small non-aggressive appearing cystic lesions scattered throughout the pancreas, largest of which is in the tail of the pancreas measuring 12 x 12 x 15 mm. These are favored to be benign, likely small pancreatic pseudocysts. Repeat MRI of the abdomen with and without IV gadolinium with MRCP is recommended in 2 years to assess for stability of these lesions. This recommendation follows ACR consensus guidelines: Management of Incidental Pancreatic Cysts: A White Paper of the ACR Incidental Findings Committee. JCape [id number];24:401-027 Electronically Signed   By: DVinnie LangtonM.D.   On: 01/10/2018 13:26    No results found.  No results found.    Assessment and Plan: Patient Active Problem List   Diagnosis Date Noted  . Pancreatic cyst 07/10/2017  . COPD exacerbation ([place] 09/09/2016  . GERD (gastroesophageal  reflux disease) 09/09/2016  . HTN (hypertension) 09/09/2016  . Cancer of [place] of [place] lung [place] 07/06/2016  . Tachycardia 02/03/2013  . Dyspnea 02/03/2013    1. COPD severe disease continue with present management.  Patient will be continued on inhalers and oxygen therapy 2. Chronic respiratory failure with hypoxia will continue with oxygen therapy as prescribed to patient has been tolerating current oxygen levels fairly well 3. Lung Cancer she follows with oncology will continue with supportive care 4. GERD continue with present management 5. SOB at baseline continue with supportive care  General Counseling: I have discussed the findings of the evaluation and [facility]  I have also discussed any further diagnostic evaluation thatmay be needed or ordered today. Mckinzie verbalizes understanding of the findings of todays visit. We also reviewed her medications today and discussed drug interactions and side effects including but not limited excessive drowsiness and altered mental states. We also discussed that there is always a risk not just to her but also people around her. she has been encouraged to call the office with any questions or concerns that should arise related to todays visit.    Time spent: 1107m  I have personally obtained a history, examined the patient, evaluated laboratory and imaging results, formulated the assessment and plan and  placed orders.    Allyne Gee, MD [facility] Sleep [facility]

## 2018-03-10 DIAGNOSIS — J449 Chronic obstructive pulmonary disease, unspecified: Secondary | ICD-10-CM | POA: Diagnosis not present

## 2018-03-14 ENCOUNTER — Other Ambulatory Visit: Payer: Self-pay | Admitting: Internal Medicine

## 2018-03-27 ENCOUNTER — Other Ambulatory Visit: Payer: Self-pay

## 2018-03-27 DIAGNOSIS — J449 Chronic obstructive pulmonary disease, unspecified: Secondary | ICD-10-CM | POA: Diagnosis not present

## 2018-03-27 MED ORDER — ALBUTEROL SULFATE HFA 108 (90 BASE) MCG/ACT IN AERS: 2.0000 | INHALATION_SPRAY | Freq: Four times a day (QID) | RESPIRATORY_TRACT | 5 refills | Status: DC | PRN

## 2018-03-30 ENCOUNTER — Encounter: Payer: Self-pay | Admitting: *Deleted

## 2018-04-09 DIAGNOSIS — J449 Chronic obstructive pulmonary disease, unspecified: Secondary | ICD-10-CM | POA: Diagnosis not present

## 2018-04-26 DIAGNOSIS — J449 Chronic obstructive pulmonary disease, unspecified: Secondary | ICD-10-CM | POA: Diagnosis not present

## 2018-05-01 DIAGNOSIS — K219 Gastro-esophageal reflux disease without esophagitis: Secondary | ICD-10-CM | POA: Diagnosis not present

## 2018-05-01 DIAGNOSIS — M353 Polymyalgia rheumatica: Secondary | ICD-10-CM | POA: Diagnosis not present

## 2018-05-01 DIAGNOSIS — J439 Emphysema, unspecified: Secondary | ICD-10-CM | POA: Diagnosis not present

## 2018-05-01 DIAGNOSIS — I10 Essential (primary) hypertension: Secondary | ICD-10-CM | POA: Diagnosis not present

## 2018-05-04 ENCOUNTER — Emergency Department: Payer: Medicare Other

## 2018-05-04 ENCOUNTER — Other Ambulatory Visit: Payer: Self-pay

## 2018-05-04 ENCOUNTER — Encounter: Payer: Self-pay | Admitting: Radiology

## 2018-05-04 ENCOUNTER — Emergency Department
Admission: EM | Admit: 2018-05-04 | Discharge: 2018-05-04 | Disposition: A | Discharge status: Home / Self Care | Payer: Medicare Other | Attending: Emergency Medicine | Admitting: Emergency Medicine

## 2018-05-04 DIAGNOSIS — R079 Chest pain, unspecified: Secondary | ICD-10-CM | POA: Diagnosis not present

## 2018-05-04 DIAGNOSIS — I700 Atherosclerosis of aorta: Secondary | ICD-10-CM | POA: Diagnosis not present

## 2018-05-04 DIAGNOSIS — R509 Fever, unspecified: Secondary | ICD-10-CM | POA: Diagnosis not present

## 2018-05-04 DIAGNOSIS — D0222 Carcinoma in situ of left bronchus and lung: Secondary | ICD-10-CM | POA: Insufficient documentation

## 2018-05-04 DIAGNOSIS — Z79899 Other long term (current) drug therapy: Secondary | ICD-10-CM | POA: Insufficient documentation

## 2018-05-04 DIAGNOSIS — J449 Chronic obstructive pulmonary disease, unspecified: Secondary | ICD-10-CM | POA: Diagnosis not present

## 2018-05-04 DIAGNOSIS — R350 Frequency of micturition: Secondary | ICD-10-CM | POA: Diagnosis not present

## 2018-05-04 DIAGNOSIS — I10 Essential (primary) hypertension: Secondary | ICD-10-CM | POA: Diagnosis not present

## 2018-05-04 DIAGNOSIS — J189 Pneumonia, unspecified organism: Secondary | ICD-10-CM | POA: Insufficient documentation

## 2018-05-04 DIAGNOSIS — Z87891 Personal history of nicotine dependence: Secondary | ICD-10-CM | POA: Diagnosis not present

## 2018-05-04 DIAGNOSIS — R0789 Other chest pain: Secondary | ICD-10-CM | POA: Diagnosis not present

## 2018-05-04 LAB — CBC WITH DIFFERENTIAL/PLATELET
Basophils Absolute: 0.1 10*3/uL (ref 0–0.1)
Basophils Relative: 1 %
EOS ABS: 0 10*3/uL (ref 0–0.7)
Eosinophils Relative: 0 %
HCT: 32.6 % — ABNORMAL LOW (ref 35.0–47.0)
HEMOGLOBIN: 11.3 g/dL — AB (ref 12.0–16.0)
Lymphocytes Relative: 6 %
Lymphs Abs: 0.6 10*3/uL — ABNORMAL LOW (ref 1.0–3.6)
MCH: 32.1 pg (ref 26.0–34.0)
MCHC: 34.6 g/dL (ref 32.0–36.0)
MCV: 92.8 fL (ref 80.0–100.0)
MONOS PCT: 6 %
Monocytes Absolute: 0.6 10*3/uL (ref 0.2–0.9)
NEUTROS PCT: 87 %
Neutro Abs: 8.3 10*3/uL — ABNORMAL HIGH (ref 1.4–6.5)
Platelets: 190 10*3/uL (ref 150–440)
RBC: 3.52 MIL/uL — AB (ref 3.80–5.20)
RDW: 13.7 % (ref 11.5–14.5)
WBC: 9.5 10*3/uL (ref 3.6–11.0)

## 2018-05-04 LAB — COMPREHENSIVE METABOLIC PANEL
ALK PHOS: 82 U/L (ref 38–126)
ALT: 21 U/L (ref 14–54)
AST: 27 U/L (ref 15–41)
Albumin: 3.5 g/dL (ref 3.5–5.0)
Anion gap: 9 (ref 5–15)
BUN: 14 mg/dL (ref 6–20)
CALCIUM: 8.6 mg/dL — AB (ref 8.9–10.3)
CO2: 26 mmol/L (ref 22–32)
CREATININE: 0.82 mg/dL (ref 0.44–1.00)
Chloride: 99 mmol/L — ABNORMAL LOW (ref 101–111)
Glucose, Bld: 138 mg/dL — ABNORMAL HIGH (ref 65–99)
Potassium: 3.8 mmol/L (ref 3.5–5.1)
Sodium: 134 mmol/L — ABNORMAL LOW (ref 135–145)
Total Bilirubin: 0.9 mg/dL (ref 0.3–1.2)
Total Protein: 6.7 g/dL (ref 6.5–8.1)

## 2018-05-04 LAB — URINALYSIS, COMPLETE (UACMP) WITH MICROSCOPIC
Bacteria, UA: NONE SEEN
Bilirubin Urine: NEGATIVE
Glucose, UA: NEGATIVE mg/dL
HGB URINE DIPSTICK: NEGATIVE
Ketones, ur: NEGATIVE mg/dL
NITRITE: NEGATIVE
PH: 6 (ref 5.0–8.0)
PROTEIN: NEGATIVE mg/dL
Specific Gravity, Urine: 1.014 (ref 1.005–1.030)

## 2018-05-04 LAB — FIBRIN DERIVATIVES D-DIMER (ARMC ONLY): Fibrin derivatives D-dimer (ARMC): 994.55 ng/mL (FEU) — ABNORMAL HIGH (ref 0.00–499.00)

## 2018-05-04 LAB — TROPONIN I: Troponin I: <0.03 ng/mL (ref <0.03)

## 2018-05-04 LAB — LACTIC ACID, PLASMA: Lactic Acid, Venous: 1.2 mmol/L (ref 0.5–1.9)

## 2018-05-04 MED ORDER — CLINDAMYCIN HCL 300 MG PO CAPS: 300.0000 mg | ORAL_CAPSULE | Freq: Three times a day (TID) | ORAL | 0 refills | Status: AC

## 2018-05-04 MED ORDER — CLINDAMYCIN HCL 150 MG PO CAPS
600.0000 mg | ORAL_CAPSULE | Freq: Once | ORAL | Status: AC
Start: 2018-05-04 — End: 2018-05-04
  Administered 2018-05-04: 600 mg via ORAL
  Filled 2018-05-04: qty 4

## 2018-05-04 MED ORDER — IOPAMIDOL (ISOVUE-370) INJECTION 76%
75.0000 mL | Freq: Once | INTRAVENOUS | Status: AC | PRN
  Administered 2018-05-04: 75 mL via INTRAVENOUS

## 2018-05-04 NOTE — Discharge Instructions (Signed)
The CT scan shows  you have a small pneumonia. The radiologist thinks this may be from aspirating. I will give you some clindamycin one pill 3 times a day. Please return for worse pain, fever or shortness of breath. please follow-up with your regular doctor Monday or Tuesday.

## 2018-05-04 NOTE — ED Triage Notes (Signed)
Says chest pain since she goto up this am. Left side and upper left back.  Also says she has pain before she urinates

## 2018-05-04 NOTE — ED Provider Notes (Signed)
[facility] Emergency Department Provider Note  \ ____________________________________________   First MD Initiated Contact with Patient 05/04/18 1549     (approximate)  I have reviewed the triage vital signs and the nursing notes.   HISTORY  Chief Complaint Urinary Frequency and Chest Pain    HPI Bethany Lambert is a 84 y.o. female who reports left-sided chest pain from just medial to the scapula on the left rating through to the front. This been going on today seems to be little bit worse when she breathes. She is not really short of breath she does have a low-grade fever. She also says she has some pain before she urinates. More concerned about the chest problem she's had lung cancer before radiation and chemotherapy.   Past Medical History:  Diagnosis Date  . COPD (chronic obstructive pulmonary disease) ([place])   . Erythrocytosis   . GERD (gastroesophageal reflux disease)   . History of chemotherapy   . History of radiation therapy   . Hyperlipidemia   . Hypertension   . Kidney failure   . Lung cancer ([place])    squamous, stage 3 non small cell lung ca  . Polycythemia   . Psoriasis     Patient Active Problem List   Diagnosis Date Noted  . Pancreatic cyst 07/10/2017  . COPD exacerbation ([place]) 09/09/2016  . GERD (gastroesophageal reflux disease) 09/09/2016  . HTN (hypertension) 09/09/2016  . Cancer of upper lobe of left lung ([place]) 07/06/2016  . Tachycardia 02/03/2013  . Dyspnea 02/03/2013    Past Surgical History:  Procedure Laterality Date  . CATARACT EXTRACTION    . COLON RESECTION    . CT GUIDED BIOPSY  ([facility] HX)  11/19/2012   lung  . HERNIA REPAIR    . TOTAL ABDOMINAL HYSTERECTOMY      Prior to Admission medications   Medication Sig Start Date End Date Taking? Authorizing Provider  albuterol (PROVENTIL HFA;VENTOLIN HFA) 108 (90 Base) MCG/ACT inhaler Inhale 2 puffs into the lungs every 6 (six) hours as needed for wheezing.  03/27/18   Lavera Guise, MD  calcium carbonate (OS-CAL) 1250 (500 Ca) MG chewable tablet Chew 1 tablet by mouth daily.    [provider]  clindamycin (CLEOCIN) 300 MG capsule Take 1 capsule (300 mg total) by mouth 3 (three) times daily for 10 days. 05/04/18 05/14/18  Nena Polio, MD  Cyanocobalamin (VITAMIN B-12 IJ) Inject as directed every 30 (thirty) days.    [provider]  diltiazem (DILACOR XR) 120 MG 24 hr capsule Take 120 mg by mouth daily.    [provider]  docusate sodium (COLACE) 100 MG capsule Take 100 mg by mouth 2 (two) times daily.    [provider]  fluocinonide (LIDEX) 0.05 % external solution Apply 1 application topically 2 (two) times daily.  08/18/16   [provider]  guaiFENesin (MUCINEX) 600 MG 12 hr tablet Take 600 mg by mouth 2 (two) times daily.    [provider]  ipratropium-albuterol (DUONEB) 0.5-2.5 (3) MG/3ML SOLN Take 3 mLs by nebulization.    [provider]  meclizine (ANTIVERT) 12.5 MG tablet Take 12.5 mg by mouth 3 (three) times daily as needed for dizziness.    [provider]  Melatonin 3 MG TABS Take 3 mg by mouth at bedtime.    [provider]  metoprolol (LOPRESSOR) 50 MG tablet Take 1 tablet (50 mg total) by mouth 2 (two) times daily. 01/31/13  Wellington Hampshire, MD  omeprazole (PRILOSEC) 20 MG capsule Take 20 mg by mouth 2 (two) times daily before a meal. Takes 20 mg am and 40 mg pm daily.    [provider]  potassium chloride (K-DUR,KLOR-CON) 10 MEQ tablet Take 10 mEq by mouth daily.    [provider]  Prenatal Vit-Fe Fumarate-FA (PRENAVITE MULTIPLE VITAMIN PO) Take by mouth daily. Reported on 01/27/2016    [provider]  SYMBICORT 80-4.5 MCG/ACT inhaler USE TWO PUFFS TWO TIMES A DAY 03/15/18   Ronnell Freshwater, NP  tiotropium (SPIRIVA) 18 MCG inhalation capsule Place 18 mcg into inhaler and inhale daily.    [provider]    triamcinolone cream (KENALOG) 0.1 % Apply 1 application topically 2 (two) times daily.  06/16/16   [provider]    Allergies Ciprofloxacin; Fluocinolone; and Norvasc [amlodipine besylate]  Family History  Problem Relation Age of Onset  . Heart attack Father   . Cancer Son     Social History Social History   Tobacco Use  . Smoking status: Former Smoker    Packs/day: 2.00    Years: 40.00    Pack years: 80.00    Types: Cigarettes  . Smokeless tobacco: Never Used  Substance Use Topics  . Alcohol use: No    Alcohol/week: 0.0 oz  . Drug use: No    Review of Systems  Constitutional:subjectively No fever/chills Eyes: No visual changes. ENT: No sore throat. Cardiovascular:chest pain. Respiratory: Denies shortness of breath. Gastrointestinal: No abdominal pain.  No nausea, no vomiting.  No diarrhea.  No constipation. Genitourinary: Negative for dysuria. Musculoskeletal: Negative for back pain. Skin: Negative for rash. Neurological: Negative for headaches, focal weakness   ____________________________________________   PHYSICAL EXAM:  VITAL SIGNS: ED Triage Vitals  Enc Vitals Group     BP 05/04/18 1248 (!) 164/75     Pulse Rate 05/04/18 1248 95     Resp 05/04/18 1248 (!) 21     Temp 05/04/18 1248 (!) 100.7 F (38.2 C)     Temp Source 05/04/18 1248 Oral     SpO2 05/04/18 1248 97 %     Weight 05/04/18 1250 108 lb (49 kg)     Height 05/04/18 1250 4\' 10"  (1.473 m)     Head Circumference --      Peak Flow --      Pain Score 05/04/18 1250 5     Pain Loc --      Pain Edu? --      Excl. in [place]? --     Constitutional: Alert and oriented. Well appearing and in no acute distress. Eyes: Conjunctivae are normal.  Head: Atraumatic. Nose: No congestion/rhinnorhea. Mouth/Throat: Mucous membranes are moist.  Oropharynx non-erythematous. Neck: No stridor. Cardiovascular: Normal rate, regular rhythm. Grossly normal heart sounds.  Good peripheral  circulation. Respiratory: Normal respiratory effort.  No retractions. Lungs CTAB. Gastrointestinal: Soft and nontender. No distention. No abdominal bruits. No CVA tenderness. Musculoskeletal: No lower extremity tenderness nor edema.  No joint effusions. Neurologic:  Normal speech and language. No gross focal neurologic deficits are appreciated. No gait instability. Skin:  Skin is warm, dry and intact. No rash noted. Psychiatric: Mood and affect are normal. Speech and behavior are normal.  ____________________________________________   LABS (all labs ordered are listed, but only abnormal results are displayed)  Labs Reviewed  COMPREHENSIVE METABOLIC PANEL - Abnormal; Notable for the following components:      Result Value   Sodium 134 (*)  Chloride 99 (*)    Glucose, Bld 138 (*)    Calcium 8.6 (*)    All other components within normal limits  CBC WITH DIFFERENTIAL/PLATELET - Abnormal; Notable for the following components:   RBC 3.52 (*)    Hemoglobin 11.3 (*)    HCT 32.6 (*)    Neutro Abs 8.3 (*)    Lymphs Abs 0.6 (*)    All other components within normal limits  URINALYSIS, COMPLETE (UACMP) WITH MICROSCOPIC - Abnormal; Notable for the following components:   Color, Urine YELLOW (*)    APPearance HAZY (*)    Leukocytes, UA TRACE (*)    All other components within normal limits  FIBRIN DERIVATIVES D-DIMER ([facility] ONLY) - Abnormal; Notable for the following components:   Fibrin derivatives D-dimer [facility]) 994.55 (*)    All other components within normal limits  LACTIC ACID, PLASMA  TROPONIN I   ____________________________________________  EKG  EKG read and interpreted by me shows normal sinus rhythm rate of 90 normal axis no acute ST-T wave changes ____________________________________________  RADIOLOGY  ED MD interpretation: chest x-ray read by radiology reviewed by me shows no acute abnormality radiologist is saying stable left upper lobe acutely masslike density chest  CT done for positive d-dimer and pleuritic chest pain seems to show a very mild pneumonia. This is in the area where the patient has her pain. Official radiology report(s): Dg Chest 2 View  Result Date: 05/04/2018 CLINICAL DATA:  Left chest pain extending through the shoulder beginning this morning. COPD. Stage III non-small cell left upper lobe lung carcinoma treated with radiation therapy chemotherapy. EXAM: CHEST - 2 VIEW COMPARISON:  10/18/2017 and chest CT dated 06/27/2017. FINDINGS: Stable spiculated mass-like density in left upper lobe in the suprahilar region. The remainder of the lungs are clear and mildly hyperexpanded. Thoracic spine degenerative changes. IMPRESSION: No acute abnormality. Stable changes of COPD and stable left upper lobe spiculated mass-like density. Electronically Signed   By: Claudie Revering M.D.   On: 05/04/2018 13:30   Ct Angio Chest Pe W And/or Wo Contrast  Result Date: 05/04/2018 CLINICAL DATA:  Chest pain. Right upper lobe lung cancer in February 2017 with radiation finished in August of 2017. Left lung cancer in February 2013 with chemotherapy and radiation. EXAM: CT ANGIOGRAPHY CHEST WITH CONTRAST TECHNIQUE: Multidetector CT imaging of the chest was performed using the standard protocol during bolus administration of intravenous contrast. Multiplanar CT image reconstructions and MIPs were obtained to evaluate the vascular anatomy. CONTRAST:  41mL ISOVUE-370 IOPAMIDOL (ISOVUE-370) INJECTION 76% COMPARISON:  Chest x-ray May 04, 2018.  Chest CT June 27, 2017. FINDINGS: Cardiovascular: Atherosclerotic change is seen in the nonaneurysmal thoracic aorta. Coronary artery calcifications. Stable heart. No pulmonary emboli. Mediastinum/Nodes: The thyroid and esophagus are normal. No adenopathy in the chest. No effusions. Small hiatal hernia. Lungs/Pleura: Central airways are stable. No pneumothorax. Emphysematous changes in the lungs. Opacity posteriorly in the right upper lobe on  series 6, image 26-27 is favored to represent a ball vein radiation change, similar to slightly increased from previous studies. The nodular density anterior to the right hilum on axial image 29 measures 8 mm in greatest dimension, smaller in the interval. The patchy density in the right lung on image 56 is similar to the previous study. No new nodule or mass in the right lung. The soft tissue centered in the left hilum is similar in the interval. Radiation change emanating superiorly from the left hilar soft tissue is stable. Opacity  more dependently in the left upper lobe such as on axial image 26 is new and likely represents some infection or aspiration. A nodule posteriorly in the left lung on series 6, image 57 is similar in the interval given difference in slice selection. No new nodules or masses on the left. Upper Abdomen: Hyperplasia in the adrenal glands, unchanged. No change in the upper abdomen. Musculoskeletal: No chest wall abnormality. No acute or significant osseous findings. Review of the MIP images confirms the above findings. IMPRESSION: 1. No pulmonary emboli. 2. Pneumonia or aspiration in the left upper lobe. Recommend follow-up to resolution. 3. Post radiation changes in the left hilum and right upper lobe. 4. Atherosclerotic change in the nonaneurysmal aorta. Coronary artery calcifications. 5. No other acute abnormalities. Aortic Atherosclerosis (ICD10-I70.0) and Emphysema (ICD10-J43.9). Electronically Signed   By: Dorise Bullion III M.D   On: 05/04/2018 19:41    ____________________________________________   PROCEDURES  Procedure(s) performed:   Procedures  Critical Care performed:   ____________________________________________   INITIAL IMPRESSION / ASSESSMENT AND PLAN / ED COURSE  we'll treat the patient for possible aspiration with clindamycin 303 times a day. We'll have her follow-up with her doctor return if she is any worse.          ____________________________________________   FINAL CLINICAL IMPRESSION(S) / ED DIAGNOSES  Final diagnoses:  Community acquired pneumonia, unspecified laterality     ED Discharge Orders        Ordered    clindamycin (CLEOCIN) 300 MG capsule  3 times daily     05/04/18 1955       Note:  This document was prepared using Dragon voice recognition software and may include unintentional dictation errors.    Nena Polio, MD 05/04/18 Karl Bales

## 2018-05-04 NOTE — ED Notes (Signed)
Patient transported to CT 

## 2018-05-04 NOTE — ED Notes (Signed)
Blue top sent to lab. 

## 2018-05-10 DIAGNOSIS — J159 Unspecified bacterial pneumonia: Secondary | ICD-10-CM | POA: Diagnosis not present

## 2018-05-10 DIAGNOSIS — J439 Emphysema, unspecified: Secondary | ICD-10-CM | POA: Diagnosis not present

## 2018-05-10 DIAGNOSIS — J449 Chronic obstructive pulmonary disease, unspecified: Secondary | ICD-10-CM | POA: Diagnosis not present

## 2018-05-16 DIAGNOSIS — M79674 Pain in right toe(s): Secondary | ICD-10-CM | POA: Diagnosis not present

## 2018-05-16 DIAGNOSIS — M79675 Pain in left toe(s): Secondary | ICD-10-CM | POA: Diagnosis not present

## 2018-05-16 DIAGNOSIS — B351 Tinea unguium: Secondary | ICD-10-CM | POA: Diagnosis not present

## 2018-05-18 ENCOUNTER — Encounter: Payer: Self-pay | Admitting: Emergency Medicine

## 2018-05-18 ENCOUNTER — Other Ambulatory Visit: Payer: Self-pay

## 2018-05-18 ENCOUNTER — Emergency Department: Payer: Medicare Other

## 2018-05-18 ENCOUNTER — Inpatient Hospital Stay
Admission: EM | Admit: 2018-05-18 | Discharge: 2018-05-20 | DRG: 190 | Disposition: A | Discharge status: Home Health | Payer: Medicare Other | Attending: Specialist | Admitting: Specialist

## 2018-05-18 ENCOUNTER — Telehealth: Payer: Self-pay | Admitting: Internal Medicine

## 2018-05-18 DIAGNOSIS — J189 Pneumonia, unspecified organism: Secondary | ICD-10-CM | POA: Diagnosis not present

## 2018-05-18 DIAGNOSIS — Z87891 Personal history of nicotine dependence: Secondary | ICD-10-CM | POA: Diagnosis not present

## 2018-05-18 DIAGNOSIS — J440 Chronic obstructive pulmonary disease with acute lower respiratory infection: Secondary | ICD-10-CM | POA: Diagnosis not present

## 2018-05-18 DIAGNOSIS — Y95 Nosocomial condition: Secondary | ICD-10-CM

## 2018-05-18 DIAGNOSIS — K5900 Constipation, unspecified: Secondary | ICD-10-CM | POA: Diagnosis not present

## 2018-05-18 DIAGNOSIS — Z85118 Personal history of other malignant neoplasm of bronchus and lung: Secondary | ICD-10-CM

## 2018-05-18 DIAGNOSIS — J181 Lobar pneumonia, unspecified organism: Secondary | ICD-10-CM | POA: Diagnosis present

## 2018-05-18 DIAGNOSIS — Z881 Allergy status to other antibiotic agents status: Secondary | ICD-10-CM | POA: Diagnosis not present

## 2018-05-18 DIAGNOSIS — Z66 Do not resuscitate: Secondary | ICD-10-CM | POA: Diagnosis present

## 2018-05-18 DIAGNOSIS — J441 Chronic obstructive pulmonary disease with (acute) exacerbation: Secondary | ICD-10-CM | POA: Diagnosis not present

## 2018-05-18 DIAGNOSIS — R091 Pleurisy: Secondary | ICD-10-CM | POA: Diagnosis present

## 2018-05-18 DIAGNOSIS — Z79899 Other long term (current) drug therapy: Secondary | ICD-10-CM

## 2018-05-18 DIAGNOSIS — R000 Tachycardia, unspecified: Secondary | ICD-10-CM | POA: Diagnosis present

## 2018-05-18 DIAGNOSIS — K219 Gastro-esophageal reflux disease without esophagitis: Secondary | ICD-10-CM | POA: Diagnosis present

## 2018-05-18 DIAGNOSIS — C3412 Malignant neoplasm of upper lobe, left bronchus or lung: Secondary | ICD-10-CM | POA: Diagnosis not present

## 2018-05-18 DIAGNOSIS — Z8249 Family history of ischemic heart disease and other diseases of the circulatory system: Secondary | ICD-10-CM | POA: Diagnosis not present

## 2018-05-18 DIAGNOSIS — E785 Hyperlipidemia, unspecified: Secondary | ICD-10-CM | POA: Diagnosis not present

## 2018-05-18 DIAGNOSIS — Z9981 Dependence on supplemental oxygen: Secondary | ICD-10-CM | POA: Diagnosis not present

## 2018-05-18 DIAGNOSIS — E871 Hypo-osmolality and hyponatremia: Secondary | ICD-10-CM | POA: Diagnosis not present

## 2018-05-18 DIAGNOSIS — Z923 Personal history of irradiation: Secondary | ICD-10-CM

## 2018-05-18 DIAGNOSIS — Z7951 Long term (current) use of inhaled steroids: Secondary | ICD-10-CM

## 2018-05-18 DIAGNOSIS — Z888 Allergy status to other drugs, medicaments and biological substances status: Secondary | ICD-10-CM

## 2018-05-18 DIAGNOSIS — Z90710 Acquired absence of both cervix and uterus: Secondary | ICD-10-CM

## 2018-05-18 DIAGNOSIS — R0602 Shortness of breath: Secondary | ICD-10-CM | POA: Diagnosis not present

## 2018-05-18 DIAGNOSIS — Z9221 Personal history of antineoplastic chemotherapy: Secondary | ICD-10-CM | POA: Diagnosis not present

## 2018-05-18 DIAGNOSIS — I10 Essential (primary) hypertension: Secondary | ICD-10-CM | POA: Diagnosis present

## 2018-05-18 DIAGNOSIS — L409 Psoriasis, unspecified: Secondary | ICD-10-CM | POA: Diagnosis not present

## 2018-05-18 DIAGNOSIS — K5730 Diverticulosis of large intestine without perforation or abscess without bleeding: Secondary | ICD-10-CM | POA: Diagnosis not present

## 2018-05-18 DIAGNOSIS — D751 Secondary polycythemia: Secondary | ICD-10-CM | POA: Diagnosis present

## 2018-05-18 LAB — HEPATIC FUNCTION PANEL
ALK PHOS: 60 U/L (ref 38–126)
ALT: 17 U/L (ref 14–54)
AST: 20 U/L (ref 15–41)
Albumin: 2.8 g/dL — ABNORMAL LOW (ref 3.5–5.0)
BILIRUBIN TOTAL: 0.5 mg/dL (ref 0.3–1.2)
Bilirubin, Direct: <0.1 mg/dL — ABNORMAL LOW (ref 0.1–0.5)
Total Protein: 6.6 g/dL (ref 6.5–8.1)

## 2018-05-18 LAB — CBC WITH DIFFERENTIAL/PLATELET
BASOS ABS: 0 10*3/uL (ref 0–0.1)
BASOS PCT: 0 %
Eosinophils Absolute: 0.1 10*3/uL (ref 0–0.7)
Eosinophils Relative: 1 %
HCT: 27.5 % — ABNORMAL LOW (ref 35.0–47.0)
HEMOGLOBIN: 9.6 g/dL — AB (ref 12.0–16.0)
Lymphocytes Relative: 12 %
Lymphs Abs: 0.9 10*3/uL — ABNORMAL LOW (ref 1.0–3.6)
MCH: 31.9 pg (ref 26.0–34.0)
MCHC: 34.8 g/dL (ref 32.0–36.0)
MCV: 91.8 fL (ref 80.0–100.0)
Monocytes Absolute: 0.6 10*3/uL (ref 0.2–0.9)
Monocytes Relative: 8 %
NEUTROS PCT: 79 %
Neutro Abs: 6.4 10*3/uL (ref 1.4–6.5)
Platelets: 397 10*3/uL (ref 150–440)
RBC: 3 MIL/uL — ABNORMAL LOW (ref 3.80–5.20)
RDW: 12.9 % (ref 11.5–14.5)
WBC: 8.1 10*3/uL (ref 3.6–11.0)

## 2018-05-18 LAB — BASIC METABOLIC PANEL
ANION GAP: 12 (ref 5–15)
BUN: 11 mg/dL (ref 6–20)
CHLORIDE: 90 mmol/L — AB (ref 101–111)
CO2: 26 mmol/L (ref 22–32)
Calcium: 8.8 mg/dL — ABNORMAL LOW (ref 8.9–10.3)
Creatinine, Ser: 0.89 mg/dL (ref 0.44–1.00)
GFR calc non Af Amer: 59 mL/min — ABNORMAL LOW (ref >60)
Glucose, Bld: 124 mg/dL — ABNORMAL HIGH (ref 65–99)
POTASSIUM: 4.1 mmol/L (ref 3.5–5.1)
Sodium: 128 mmol/L — ABNORMAL LOW (ref 135–145)

## 2018-05-18 LAB — LACTIC ACID, PLASMA: Lactic Acid, Venous: 0.7 mmol/L (ref 0.5–1.9)

## 2018-05-18 LAB — LIPASE, BLOOD: Lipase: 34 U/L (ref 11–51)

## 2018-05-18 MED ORDER — ONDANSETRON HCL 4 MG PO TABS: 4.0000 mg | ORAL_TABLET | Freq: Four times a day (QID) | ORAL | Status: DC | PRN

## 2018-05-18 MED ORDER — DOCUSATE SODIUM 100 MG PO CAPS
100.0000 mg | ORAL_CAPSULE | Freq: Two times a day (BID) | ORAL | Status: DC
  Administered 2018-05-18: 100 mg via ORAL
  Filled 2018-05-18 (×2): qty 1

## 2018-05-18 MED ORDER — ACETAMINOPHEN 325 MG PO TABS: 650.0000 mg | ORAL_TABLET | Freq: Four times a day (QID) | ORAL | Status: DC | PRN

## 2018-05-18 MED ORDER — MELATONIN 5 MG PO TABS
5.0000 mg | ORAL_TABLET | Freq: Every day | ORAL | Status: DC
  Administered 2018-05-18: 5 mg via ORAL
  Filled 2018-05-18 (×3): qty 1

## 2018-05-18 MED ORDER — POLYETHYLENE GLYCOL 3350 17 G PO PACK: 17.0000 g | PACK | Freq: Every day | ORAL | Status: DC | PRN

## 2018-05-18 MED ORDER — ALBUTEROL SULFATE (2.5 MG/3ML) 0.083% IN NEBU: 2.5000 mg | INHALATION_SOLUTION | Freq: Four times a day (QID) | RESPIRATORY_TRACT | Status: DC | PRN

## 2018-05-18 MED ORDER — PANTOPRAZOLE SODIUM 40 MG PO TBEC
40.0000 mg | DELAYED_RELEASE_TABLET | Freq: Every day | ORAL | Status: DC
  Administered 2018-05-19 – 2018-05-20 (×2): 40 mg via ORAL
  Filled 2018-05-18 (×2): qty 1

## 2018-05-18 MED ORDER — CALCIUM CARBONATE ANTACID 500 MG PO CHEW
1500.0000 mg | CHEWABLE_TABLET | Freq: Every day | ORAL | Status: DC
  Administered 2018-05-19 – 2018-05-20 (×2): 1500 mg via ORAL
  Filled 2018-05-18 (×3): qty 8

## 2018-05-18 MED ORDER — HYDRALAZINE HCL 20 MG/ML IJ SOLN
10.0000 mg | Freq: Four times a day (QID) | INTRAMUSCULAR | Status: DC | PRN
  Administered 2018-05-18: 10 mg via INTRAVENOUS

## 2018-05-18 MED ORDER — SODIUM CHLORIDE 0.9 % IV BOLUS
1000.0000 mL | Freq: Once | INTRAVENOUS | Status: AC
  Administered 2018-05-18: 1000 mL via INTRAVENOUS

## 2018-05-18 MED ORDER — SODIUM CHLORIDE 0.9 % IV SOLN
INTRAVENOUS | Status: DC
  Administered 2018-05-18: 22:00:00 via INTRAVENOUS

## 2018-05-18 MED ORDER — TIOTROPIUM BROMIDE MONOHYDRATE 18 MCG IN CAPS
18.0000 ug | ORAL_CAPSULE | Freq: Every day | RESPIRATORY_TRACT | Status: DC
  Administered 2018-05-18 – 2018-05-20 (×3): 18 ug via RESPIRATORY_TRACT
  Filled 2018-05-18: qty 5

## 2018-05-18 MED ORDER — ACETAMINOPHEN 650 MG RE SUPP: 650.0000 mg | Freq: Four times a day (QID) | RECTAL | Status: DC | PRN

## 2018-05-18 MED ORDER — ENOXAPARIN SODIUM 40 MG/0.4ML ~~LOC~~ SOLN
40.0000 mg | SUBCUTANEOUS | Status: DC
  Administered 2018-05-18 – 2018-05-19 (×2): 40 mg via SUBCUTANEOUS
  Filled 2018-05-18 (×2): qty 0.4

## 2018-05-18 MED ORDER — SENNA 8.6 MG PO TABS
1.0000 | ORAL_TABLET | Freq: Two times a day (BID) | ORAL | Status: DC
  Administered 2018-05-18 – 2018-05-19 (×2): 8.6 mg via ORAL
  Filled 2018-05-18 (×2): qty 1

## 2018-05-18 MED ORDER — IPRATROPIUM-ALBUTEROL 0.5-2.5 (3) MG/3ML IN SOLN
3.0000 mL | Freq: Four times a day (QID) | RESPIRATORY_TRACT | Status: DC | PRN
  Administered 2018-05-18: 3 mL via RESPIRATORY_TRACT
  Filled 2018-05-18: qty 3

## 2018-05-18 MED ORDER — MOMETASONE FURO-FORMOTEROL FUM 100-5 MCG/ACT IN AERO
2.0000 | INHALATION_SPRAY | Freq: Two times a day (BID) | RESPIRATORY_TRACT | Status: DC
  Administered 2018-05-18 – 2018-05-20 (×4): 2 via RESPIRATORY_TRACT
  Filled 2018-05-18: qty 8.8

## 2018-05-18 MED ORDER — SODIUM CHLORIDE 0.9 % IV SOLN
1.0000 g | INTRAVENOUS | Status: DC
  Administered 2018-05-18 – 2018-05-19 (×2): 1 g via INTRAVENOUS
  Filled 2018-05-18 (×2): qty 1
  Filled 2018-05-18: qty 10

## 2018-05-18 MED ORDER — HYDRALAZINE HCL 20 MG/ML IJ SOLN
INTRAMUSCULAR | Status: AC
  Administered 2018-05-18: 10 mg via INTRAVENOUS
  Filled 2018-05-18: qty 1

## 2018-05-18 MED ORDER — POTASSIUM CHLORIDE CRYS ER 10 MEQ PO TBCR
10.0000 meq | EXTENDED_RELEASE_TABLET | Freq: Every day | ORAL | Status: DC
  Administered 2018-05-19 – 2018-05-20 (×2): 10 meq via ORAL
  Filled 2018-05-18 (×2): qty 1

## 2018-05-18 MED ORDER — MECLIZINE HCL 12.5 MG PO TABS
12.5000 mg | ORAL_TABLET | Freq: Three times a day (TID) | ORAL | Status: DC | PRN
  Filled 2018-05-18: qty 1

## 2018-05-18 MED ORDER — DILTIAZEM HCL ER COATED BEADS 120 MG PO CP24
120.0000 mg | ORAL_CAPSULE | Freq: Every day | ORAL | Status: DC
  Administered 2018-05-19 – 2018-05-20 (×2): 120 mg via ORAL
  Filled 2018-05-18 (×4): qty 1

## 2018-05-18 MED ORDER — SODIUM CHLORIDE 0.9 % IV SOLN
500.0000 mg | INTRAVENOUS | Status: DC
  Administered 2018-05-18 – 2018-05-19 (×2): 500 mg via INTRAVENOUS
  Filled 2018-05-18 (×3): qty 500

## 2018-05-18 MED ORDER — METOPROLOL TARTRATE 50 MG PO TABS
50.0000 mg | ORAL_TABLET | Freq: Once | ORAL | Status: AC
  Administered 2018-05-18: 50 mg via ORAL

## 2018-05-18 MED ORDER — PIPERACILLIN-TAZOBACTAM 3.375 G IVPB 30 MIN
3.3750 g | Freq: Once | INTRAVENOUS | Status: AC
  Administered 2018-05-18: 3.375 g via INTRAVENOUS
  Filled 2018-05-18: qty 50

## 2018-05-18 MED ORDER — METOPROLOL TARTRATE 50 MG PO TABS
ORAL_TABLET | ORAL | Status: AC
  Administered 2018-05-18: 50 mg via ORAL
  Filled 2018-05-18: qty 1

## 2018-05-18 MED ORDER — FLUTICASONE PROPIONATE 50 MCG/ACT NA SUSP
1.0000 | Freq: Every day | NASAL | Status: DC
  Filled 2018-05-18: qty 16

## 2018-05-18 MED ORDER — ONDANSETRON HCL 4 MG/2ML IJ SOLN
4.0000 mg | Freq: Four times a day (QID) | INTRAMUSCULAR | Status: DC | PRN
Start: 2018-05-18 — End: 2018-05-20

## 2018-05-18 MED ORDER — BISACODYL 10 MG RE SUPP
10.0000 mg | Freq: Every day | RECTAL | Status: DC | PRN
  Filled 2018-05-18: qty 1

## 2018-05-18 MED ORDER — LORATADINE 10 MG PO TABS
10.0000 mg | ORAL_TABLET | Freq: Every day | ORAL | Status: DC
  Administered 2018-05-19 – 2018-05-20 (×2): 10 mg via ORAL
  Filled 2018-05-18 (×2): qty 1

## 2018-05-18 MED ORDER — METOPROLOL TARTRATE 50 MG PO TABS
50.0000 mg | ORAL_TABLET | Freq: Two times a day (BID) | ORAL | Status: DC
  Administered 2018-05-19 – 2018-05-20 (×3): 50 mg via ORAL
  Filled 2018-05-18 (×3): qty 1

## 2018-05-18 NOTE — ED Notes (Signed)
MD Kalisetti notified of BP 186/58. Pt due for lopressor @ 2200. Per MD, okay to given x1 50mg  lopressor dose PO now and 10mg  hydralazine IV. Floor nurse Maia Breslow notified of plan during hand off report.

## 2018-05-18 NOTE — ED Provider Notes (Signed)
[facility] Emergency Department Provider Note  ____________________________________________   First MD Initiated Contact with Patient 05/18/18 1734     (approximate)  I have reviewed the triage vital signs and the nursing notes.   HISTORY  Chief Complaint Shortness of Breath   HPI Bethany Lambert is a 84 y.o. female who self presents to the emergency department with cough and shortness of breath.  She has a past medical history of COPD and normally uses 2 L of oxygen at home.  Her symptoms have been smoldering for the past several weeks however for the past 2 days have been progressively worsening.  Her shortness of breath is now moderate to severe worse with exertion somewhat improved with rest.  He has sharp mild severity nonradiating upper chest pain worse with cough.  Her cough is nonproductive.  No fevers or chills.  Past Medical History:  Diagnosis Date  . COPD (chronic obstructive pulmonary disease) ([facility])    on 2L home o2  . Erythrocytosis   . GERD (gastroesophageal reflux disease)   . History of chemotherapy   . History of radiation therapy   . Hyperlipidemia   . Hypertension   . Kidney failure   . Lung cancer ([place])    squamous, stage 3 non small cell lung ca  . Polycythemia   . Psoriasis     Patient Active Problem List   Diagnosis Date Noted  . Pneumonia 05/18/2018  . Pancreatic cyst 07/10/2017  . COPD exacerbation ([place]) 09/09/2016  . GERD (gastroesophageal reflux disease) 09/09/2016  . HTN (hypertension) 09/09/2016  . Cancer of upper lobe of left lung ([place]) 07/06/2016  . Tachycardia 02/03/2013  . Dyspnea 02/03/2013    Past Surgical History:  Procedure Laterality Date  . CATARACT EXTRACTION    . COLON RESECTION    . CT GUIDED BIOPSY  ([facility] HX)  11/19/2012   lung  . HERNIA REPAIR    . TOTAL ABDOMINAL HYSTERECTOMY      Prior to Admission medications   Medication Sig Start Date End Date Taking? Authorizing Provider    albuterol (PROVENTIL HFA;VENTOLIN HFA) 108 (90 Base) MCG/ACT inhaler Inhale 2 puffs into the lungs every 6 (six) hours as needed for wheezing. 03/27/18  Yes Lavera Guise, MD  Cyanocobalamin (VITAMIN B-12 IJ) Inject 1,000 mcg as directed every 30 (thirty) days.    Yes [provider]  diltiazem (DILACOR XR) 120 MG 24 hr capsule Take 120 mg by mouth daily.   Yes [provider]  docusate sodium (COLACE) 100 MG capsule Take 100-200 mg by mouth 2 (two) times daily. 100 mg in the morning and 200 mg at bedtime   Yes [provider]  fluocinonide (LIDEX) 0.05 % external solution Apply 1 application topically 2 (two) times daily.  08/18/16  Yes [provider]  ipratropium-albuterol (DUONEB) 0.5-2.5 (3) MG/3ML SOLN Take 3 mLs by nebulization every 6 (six) hours as needed (wheezing).    Yes [provider]  metoprolol (LOPRESSOR) 50 MG tablet Take 1 tablet (50 mg total) by mouth 2 (two) times daily. 01/31/13  Yes Wellington Hampshire, MD  omeprazole (PRILOSEC) 20 MG capsule Take 20 mg by mouth 2 (two) times daily before a meal.    Yes [provider]  potassium chloride (K-DUR,KLOR-CON) 10 MEQ tablet Take 10 mEq by mouth daily.   Yes [provider]  SYMBICORT 80-4.5 MCG/ACT inhaler USE TWO PUFFS TWO TIMES A DAY 03/15/18  Yes Ronnell Freshwater, NP  tiotropium (SPIRIVA) 18 MCG inhalation capsule Place 18 mcg into inhaler and inhale daily.   Yes [provider]  doxycycline (VIBRAMYCIN) 100 MG capsule Take 1 capsule (100 mg total) by mouth 2 (two) times daily for 10 days. 05/20/18 05/30/18  Henreitta Leber, MD    Allergies Ciprofloxacin; Fluocinolone; and Norvasc [amlodipine besylate]  Family History  Problem Relation Age of Onset  . Heart attack Father   . Colon cancer Father   . Stroke Mother   . Cancer Son     Social History Social History   Tobacco Use  . Smoking status: Former Smoker    Packs/day: 2.00    Years: 40.00     Pack years: 80.00    Types: Cigarettes  . Smokeless tobacco: Never Used  Substance Use Topics  . Alcohol use: No    Alcohol/week: 0.0 oz  . Drug use: No    Review of Systems Constitutional: No fever/chills Eyes: No visual changes. ENT: No sore throat. Cardiovascular: Positive for chest pain. Respiratory: Positive for shortness of breath. Gastrointestinal: No abdominal pain.  No nausea, no vomiting.  No diarrhea.  No constipation. Genitourinary: Negative for dysuria. Musculoskeletal: Negative for back pain. Skin: Negative for rash. Neurological: Negative for headaches, focal weakness or numbness.   ____________________________________________   PHYSICAL EXAM:  VITAL SIGNS: ED Triage Vitals  Enc Vitals Group     BP 05/18/18 1621 (!) 155/48     Pulse Rate 05/18/18 1621 72     Resp 05/18/18 1621 14     Temp 05/18/18 1621 98.2 F (36.8 C)     Temp Source 05/18/18 1621 Oral     SpO2 05/18/18 1621 98 %     Weight 05/18/18 1622 112 lb (50.8 kg)     Height 05/18/18 1622 4\' 10"  (1.473 m)     Head Circumference --      Peak Flow --      Pain Score 05/18/18 1624 0     Pain Loc --      Pain Edu? --      Excl. in [place]? --     Constitutional: Alert and oriented x4 appears clearly short of breath speaking in short sentences coughing during my exam Eyes: PERRL EOMI. Head: Atraumatic. Nose: No congestion/rhinnorhea. Mouth/Throat: No trismus Neck: No stridor.   Cardiovascular: Normal rate, regular rhythm. Grossly normal heart sounds.  Good peripheral circulation. Respiratory: Increased respiratory effort with crackles throughout and expiratory wheeze throughout Gastrointestinal: Soft nontender Musculoskeletal: No lower extremity edema   Neurologic:  Normal speech and language. No gross focal neurologic deficits are appreciated. Skin:  Skin is warm, dry and intact. No rash noted. Psychiatric: Mood and affect are normal. Speech and behavior are  normal.    ____________________________________________   DIFFERENTIAL includes but not limited to  Pneumonia, COPD exacerbation, pneumothorax, pulmonary embolism, acute coronary syndrome, heart failure ____________________________________________   LABS (all labs ordered are listed, but only abnormal results are displayed)  Labs Reviewed  CBC WITH DIFFERENTIAL/PLATELET - Abnormal; Notable for the following components:      Result Value   RBC 3.00 (*)    Hemoglobin 9.6 (*)    HCT 27.5 (*)    Lymphs Abs 0.9 (*)    All other components within normal limits  BASIC METABOLIC PANEL - Abnormal; Notable for the following components:   Sodium 128 (*)    Chloride 90 (*)    Glucose, Bld 124 (*)    Calcium 8.8 (*)  GFR calc non Af Amer 59 (*)    All other components within normal limits  HEPATIC FUNCTION PANEL - Abnormal; Notable for the following components:   Albumin 2.8 (*)    Bilirubin, Direct <0.1 (*)    All other components within normal limits  CBC - Abnormal; Notable for the following components:   RBC 2.60 (*)    Hemoglobin 8.3 (*)    HCT 23.6 (*)    All other components within normal limits  BASIC METABOLIC PANEL - Abnormal; Notable for the following components:   Sodium 130 (*)    Chloride 98 (*)    Calcium 7.9 (*)    All other components within normal limits  BASIC METABOLIC PANEL - Abnormal; Notable for the following components:   Sodium 132 (*)    Potassium 3.4 (*)    Chloride 100 (*)    Calcium 8.2 (*)    All other components within normal limits  MRSA PCR SCREENING  LIPASE, BLOOD  LACTIC ACID, PLASMA    Lab work reviewed by me consistent with dehydration with hypochloremic hyponatremia __________________________________________  EKG  ED ECG REPORT I, Darel Hong, the attending physician, personally viewed and interpreted this ECG.  Date: 05/18/2018 EKG Time:  Rate: 71 Rhythm: normal sinus rhythm QRS Axis: normal Intervals: normal ST/T Wave  abnormalities: normal Narrative Interpretation: no evidence of acute ischemia  ____________________________________________  RADIOLOGY  Chest x-ray reviewed by me consistent with pneumonia ____________________________________________   PROCEDURES  Procedure(s) performed: no  Procedures  Critical Care performed: no  Observation: no ____________________________________________   INITIAL IMPRESSION / ASSESSMENT AND PLAN / ED COURSE  Pertinent labs & imaging results that were available during my care of the patient were reviewed by me and considered in my medical decision making (see chart for details).  On arrival the patient is clearly short of breath and somewhat hypoxic.  Given multiple breathing treatments with minimal improvement in her symptoms.  She still has significant exertional shortness of breath.  She has rhonchi throughout in addition to wheezing and although her chest x-ray is clear I am concerned that she has a bacterial superinfection which given her tenuous respiratory state could potentially become severe.  At this point the patient requires inpatient admission for continued IV steroids, bronchodilators more than every 4 hours, and antibiotics for her community-acquired pneumonia.  I discussed with the hospitalist who has graciously agreed to admit the patient to her service.      ____________________________________________   FINAL CLINICAL IMPRESSION(S) / ED DIAGNOSES  Final diagnoses:  Hyponatremia  Hospital acquired PNA      NEW MEDICATIONS STARTED DURING THIS VISIT:  Discharge Medication List as of 05/20/2018 10:27 AM    START taking these medications   Details  doxycycline (VIBRAMYCIN) 100 MG capsule Take 1 capsule (100 mg total) by mouth 2 (two) times daily for 10 days., Starting Sun 05/20/2018, Until Wed 05/30/2018, Print         Note:  This document was prepared using Dragon voice recognition software and may include unintentional  dictation errors.     Darel Hong, MD 05/21/18 1310

## 2018-05-18 NOTE — ED Triage Notes (Signed)
Pt to ED via POV c/o shortness of breath. Pt states that she was seen 2 weeks ago for same complaint. Pt states that she has been taking a "strong medication" that she finished on Tuesday but she is not feeling any better. Pt is in NAD at this time, able to speak in complete sentences.

## 2018-05-18 NOTE — H&P (Signed)
[facility] [place] NAME: Bethany Lambert    MR#:  [id number]  DATE OF BIRTH:  1934/07/13  DATE OF ADMISSION:  05/18/2018  PRIMARY CARE PHYSICIAN: Baxter Hire, MD   REQUESTING/REFERRING PHYSICIAN: Dr. Darel Hong  CHIEF COMPLAINT:   Chief Complaint  Patient presents with  . Shortness of Breath    HISTORY OF PRESENT ILLNESS:  Bethany Lambert  is a 84 y.o. female with a known history of COPD on 2 L home oxygen, history of stage III squamous cell lung cancer on the left side status post chemoradiation, also history of right lung nodule status post focus radiation therapy, GERD who presents to hospital secondary to worsening shortness of breath and weakness. Patient was here in the emergency room 2 weeks ago for shortness of breath and cough.  Chest x-ray confirmed right lower lobe pneumonia and she was discharged on clindamycin.  She also complains of pleurisy on the right side with deep inspiration that has slightly improved while on antibiotics.  She finished her antibiotics 3 days ago.  Her symptoms started getting worse in the last 3 days.  She has been extremely weak, cough has improved.  No nausea or vomiting.  Feels actually constipated and bloated in her abdomen.  Her breathing worsened again today and so presents to the emergency room.  Chest x-ray reveals persistent pneumonia.  PAST MEDICAL HISTORY:   Past Medical History:  Diagnosis Date  . COPD (chronic obstructive pulmonary disease) (HCC)    on 2L home o2  . Erythrocytosis   . GERD (gastroesophageal reflux disease)   . History of chemotherapy   . History of radiation therapy   . Hyperlipidemia   . Hypertension   . Kidney failure   . Lung cancer ([place])    squamous, stage 3 non small cell lung ca  . Polycythemia   . Psoriasis     PAST SURGICAL HISTORY:   Past Surgical History:  Procedure Laterality Date  . CATARACT EXTRACTION    . COLON RESECTION    . CT  GUIDED BIOPSY  ([facility] HX)  11/19/2012   lung  . HERNIA REPAIR    . TOTAL ABDOMINAL HYSTERECTOMY      SOCIAL HISTORY:   Social History   Tobacco Use  . Smoking status: Former Smoker    Packs/day: 2.00    Years: 40.00    Pack years: 80.00    Types: Cigarettes  . Smokeless tobacco: Never Used  Substance Use Topics  . Alcohol use: No    Alcohol/week: 0.0 oz    FAMILY HISTORY:   Family History  Problem Relation Age of Onset  . Heart attack Father   . Colon cancer Father   . Stroke Mother   . Cancer Son     DRUG ALLERGIES:   Allergies  Allergen Reactions  . Ciprofloxacin   . Fluocinolone Other (See Comments)  . Norvasc [Amlodipine Besylate]     REVIEW OF SYSTEMS:   Review of Systems  Constitutional: Positive for malaise/fatigue. Negative for chills, fever and weight loss.  HENT: Negative for ear discharge, ear pain, hearing loss, nosebleeds and tinnitus.   Eyes: Negative for blurred vision, double vision and photophobia.  Respiratory: Positive for shortness of breath. Negative for cough, hemoptysis and wheezing.   Cardiovascular: Negative for chest pain, palpitations, orthopnea and leg swelling.  Gastrointestinal: Positive for constipation. Negative for abdominal pain, diarrhea, heartburn, melena, nausea and vomiting.  Genitourinary: Negative for dysuria,  frequency, hematuria and urgency.  Musculoskeletal: Negative for back pain, myalgias and neck pain.  Skin: Negative for rash.  Neurological: Positive for weakness. Negative for dizziness, tingling, tremors, sensory change, speech change, focal weakness and headaches.  Endo/Heme/Allergies: Does not bruise/bleed easily.  Psychiatric/Behavioral: Negative for depression.    MEDICATIONS AT HOME:   Prior to Admission medications   Medication Sig Start Date End Date Taking? Authorizing Provider  albuterol (PROVENTIL HFA;VENTOLIN HFA) 108 (90 Base) MCG/ACT inhaler Inhale 2 puffs into the lungs every 6 (six) hours as  needed for wheezing. 03/27/18   Lavera Guise, MD  calcium carbonate (OS-CAL) 1250 (500 Ca) MG chewable tablet Chew 1 tablet by mouth daily.    [provider]  Cyanocobalamin (VITAMIN B-12 IJ) Inject as directed every 30 (thirty) days.    [provider]  diltiazem (DILACOR XR) 120 MG 24 hr capsule Take 120 mg by mouth daily.    [provider]  docusate sodium (COLACE) 100 MG capsule Take 100 mg by mouth 2 (two) times daily.    [provider]  fluocinonide (LIDEX) 0.05 % external solution Apply 1 application topically 2 (two) times daily.  08/18/16   [provider]  guaiFENesin (MUCINEX) 600 MG 12 hr tablet Take 600 mg by mouth 2 (two) times daily.    [provider]  ipratropium-albuterol (DUONEB) 0.5-2.5 (3) MG/3ML SOLN Take 3 mLs by nebulization.    [provider]  meclizine (ANTIVERT) 12.5 MG tablet Take 12.5 mg by mouth 3 (three) times daily as needed for dizziness.    [provider]  Melatonin 3 MG TABS Take 3 mg by mouth at bedtime.    [provider]  metoprolol (LOPRESSOR) 50 MG tablet Take 1 tablet (50 mg total) by mouth 2 (two) times daily. 01/31/13   Wellington Hampshire, MD  omeprazole (PRILOSEC) 20 MG capsule Take 20 mg by mouth 2 (two) times daily before a meal. Takes 20 mg am and 40 mg pm daily.    [provider]  potassium chloride (K-DUR,KLOR-CON) 10 MEQ tablet Take 10 mEq by mouth daily.    [provider]  Prenatal Vit-Fe Fumarate-FA (PRENAVITE MULTIPLE VITAMIN PO) Take by mouth daily. Reported on 01/27/2016    [provider]  SYMBICORT 80-4.5 MCG/ACT inhaler USE TWO PUFFS TWO TIMES A DAY 03/15/18   Ronnell Freshwater, NP  tiotropium (SPIRIVA) 18 MCG inhalation capsule Place 18 mcg into inhaler and inhale daily.    [provider]  triamcinolone cream (KENALOG) 0.1 % Apply 1 application topically 2 (two) times daily.  06/16/16   [provider]      VITAL  SIGNS:  Blood pressure (!) 155/48, pulse 72, temperature 98.2 F (36.8 C), temperature source Oral, resp. rate 14, height 4\' 10"  (1.473 m), weight 50.8 kg (112 lb), SpO2 98 %.  PHYSICAL EXAMINATION:   Physical Exam  GENERAL:  84 y.o.-year-old patient lying in the bed with no acute distress.  EYES: Pupils equal, round, reactive to light and accommodation. No scleral icterus. Extraocular muscles intact.  HEENT: Head atraumatic, normocephalic. Oropharynx and nasopharynx clear.  NECK:  Supple, no jugular venous distention. No thyroid enlargement, no tenderness.  LUNGS: Normal breath sounds bilaterally, no wheezing, rales,rhonchi or crepitation. No use of accessory muscles of respiration.  Decreased bibasilar breath sounds CARDIOVASCULAR: S1, S2 normal. No  rubs, or gallops.  2/6 systolic murmur is present ABDOMEN: Soft, nontender, nondistended. Bowel sounds present. No organomegaly or mass.  EXTREMITIES:  No pedal edema, cyanosis, or clubbing.  NEUROLOGIC: Cranial nerves II through XII are intact. Muscle strength 5/5 in all extremities. Sensation intact. Gait not checked.  PSYCHIATRIC: The patient is alert and oriented x 3.  SKIN: No obvious rash, lesion, or ulcer.   LABORATORY PANEL:   CBC Recent Labs  Lab 05/18/18 1631  WBC 8.1  HGB 9.6*  HCT 27.5*  PLT 397   ------------------------------------------------------------------------------------------------------------------  Chemistries  Recent Labs  Lab 05/18/18 1631  NA 128*  K 4.1  CL 90*  CO2 26  GLUCOSE 124*  BUN 11  CREATININE 0.89  CALCIUM 8.8*   ------------------------------------------------------------------------------------------------------------------  Cardiac Enzymes No results for input(s): TROPONINI in the last 168 hours. ------------------------------------------------------------------------------------------------------------------  RADIOLOGY:  Ct Abdomen Pelvis Wo Contrast  Result Date:  05/18/2018 CLINICAL DATA:  Abdominal distension for over 2 weeks. EXAM: CT ABDOMEN AND PELVIS WITHOUT CONTRAST TECHNIQUE: Multidetector CT imaging of the abdomen and pelvis was performed following the standard protocol without IV contrast. COMPARISON:  CT abdomen and pelvis 07/06/2007. PET CT scan 03/02/2016. FINDINGS: Lower chest: Emphysematous disease is present. 0.4 cm nodular opacity in the right lower and a 0.3 cm nodular opacity in the left lower lobe are seen on image 1. Lung bases are otherwise unremarkable. Hepatobiliary: No focal liver abnormality is seen. No gallstones, gallbladder wall thickening, or biliary dilatation. Pancreas: 2 cystic lesions in the tail the pancreas are unchanged since the prior PET CT scan and likely due to some prior inflammatory process. The pancreas is otherwise unremarkable. Spleen: Normal in size without focal abnormality. Adrenals/Urinary Tract: Tiny right adrenal adenoma is unchanged. The left adrenal gland is unremarkable. 2-3 punctate calcifications in the right kidney could be tiny nonobstructing stones or vascular. 2 punctate calcifications in the left kidney could also be vascular or nonobstructing stones. There is no hydronephrosis. No ureteral stone. 0.7 cm lesion in the midpole the left kidney demonstrates increased density and is likely a complex cyst. Urinary bladder appears normal. Stomach/Bowel: Sigmoid diverticulosis without diverticulitis is identified. Postoperative change the hepatic flexure of the colon appears unchanged. The stomach, small bowel and appendix appear normal. Vascular/Lymphatic: Extensive atherosclerotic vascular disease is present. No aneurysm. No lymphadenopathy. Reproductive: Status post hysterectomy. No adnexal masses. Other: No fluid collection. Musculoskeletal: No acute or focal bony abnormality. Bilateral hip osteoarthritis is seen. IMPRESSION: No acute abnormality abdomen or pelvis. No finding to explain the patient's symptoms. 0.4 cm  right lower lobe and 0.3 cm left lower lobe nodules are not seen on the prior exams. No follow-up needed if patient is low-risk (and has no known or suspected primary neoplasm). Non-contrast chest CT can be considered in 12 months if patient is high-risk. This recommendation follows the consensus statement: Guidelines for Management of Incidental Pulmonary Nodules Detected on CT Images: From the Fleischner Society 2017; Radiology 2017; 284:228-243. Extensive atherosclerosis. Punctate nonobstructing stones versus vascular calcifications in the kidneys. Emphysema. Electronically Signed   By: Inge Rise M.D.   On: 05/18/2018 18:39   Dg Chest 2 View  Result Date: 05/18/2018 CLINICAL DATA:  Dyspnea, history of bilateral upper lobe lung cancer EXAM: CHEST - 2 VIEW COMPARISON:  05/04/2018 chest radiograph. FINDINGS: Surgical clips overlie the right upper quadrant of the abdomen. Stable cardiomediastinal silhouette with normal heart size. No pneumothorax. No pleural effusion. Hyperinflated lungs. Emphysema. Patchy posterior left upper lobe consolidation, increased since 05/04/2018, superimposed on chronic left upper perihilar opacity. IMPRESSION: 1. Increased patchy posterior left upper lobe consolidation since 05/04/2018 chest radiographs, presumably representing pneumonia  as suggested on 05/04/2018 chest CT. 2. This finding is superimposed on chronic left upper perihilar opacity compatible with post treatment change. 3. Short-term follow-up chest CT with IV contrast suggested after a course of antibiotic therapy in 1-2 months to document resolution and to exclude recurrent tumor. 4. Hyperinflated lungs and emphysema, suggesting COPD. Electronically Signed   By: Ilona Sorrel M.D.   On: 05/18/2018 17:29    EKG:   Orders placed or performed during the hospital encounter of 05/18/18  . ED EKG  . ED EKG  . EKG 12-Lead  . EKG 12-Lead    IMPRESSION AND PLAN:   Bethany Lambert  is a 84 y.o. female with a  known history of COPD on 2 L home oxygen, history of stage III squamous cell lung cancer on the left side status post chemoradiation, also history of right lung nodule status post focus radiation therapy, GERD who presents to hospital secondary to worsening shortness of breath and weakness.  1.  Community-acquired pneumonia-chest x-ray showing left upper lobe consolidation worse than prior x-ray. -Was on clindamycin as outpatient with no significant improvement.  Will admit, start Rocephin and azithromycin -Physical therapy consult for her weakness.  2.  Chronic COPD-significant emphysematous changes on her chest x-ray.  Continue supplemental oxygen which is her chronic home O2. -Continue nebs and inhalers.  no indication for systemic steroids at this time.  3.  Hyponatremia-gentle hydration ordered.  Follow-up in a.m.  4.  Squamous cell lung cancer-status post chemoradiation.  Follows with oncology.  Currently in observation.  5.  Tachycardia-continue Cardizem and metoprolol  6.  DVT prophylaxis-on Lovenox.  Physical therapy consulted   All the records are reviewed and case discussed with ED provider. Management plans discussed with the patient, family and they are in agreement.  CODE STATUS: Full code  TOTAL TIME TAKING CARE OF THIS PATIENT: 50 minutes.    Gladstone Lighter M.D on 05/18/2018 at 7:32 PM  Between 7am to 6pm - Pager - [phone]  After 6pm go to [email or web address] - password [email or web address] [facility]  Office  [phone]  CC: Primary care physician; Baxter Hire, MD

## 2018-05-18 NOTE — Telephone Encounter (Signed)
Patient called and she is feeling very weak and not able to eat and she has called her pcp and our office pulmonary and thinks she needs to be in the hospital, I advised pt if her pcp does not get her an appt today to go back to the ER so she can get admitted, pt has been on abx all week and she is not feeling better. Beth

## 2018-05-19 LAB — BASIC METABOLIC PANEL
Anion gap: 9 (ref 5–15)
BUN: 8 mg/dL (ref 6–20)
CHLORIDE: 98 mmol/L — AB (ref 101–111)
CO2: 23 mmol/L (ref 22–32)
Calcium: 7.9 mg/dL — ABNORMAL LOW (ref 8.9–10.3)
Creatinine, Ser: 0.82 mg/dL (ref 0.44–1.00)
GFR calc Af Amer: >60 mL/min (ref >60)
GFR calc non Af Amer: >60 mL/min (ref >60)
Glucose, Bld: 76 mg/dL (ref 65–99)
POTASSIUM: 3.7 mmol/L (ref 3.5–5.1)
Sodium: 130 mmol/L — ABNORMAL LOW (ref 135–145)

## 2018-05-19 LAB — CBC
HEMATOCRIT: 23.6 % — AB (ref 35.0–47.0)
Hemoglobin: 8.3 g/dL — ABNORMAL LOW (ref 12.0–16.0)
MCH: 31.9 pg (ref 26.0–34.0)
MCHC: 35.1 g/dL (ref 32.0–36.0)
MCV: 90.9 fL (ref 80.0–100.0)
Platelets: 297 10*3/uL (ref 150–440)
RBC: 2.6 MIL/uL — AB (ref 3.80–5.20)
RDW: 12.9 % (ref 11.5–14.5)
WBC: 6.5 10*3/uL (ref 3.6–11.0)

## 2018-05-19 LAB — MRSA PCR SCREENING: MRSA by PCR: NEGATIVE

## 2018-05-19 MED ORDER — ALUM & MAG HYDROXIDE-SIMETH 200-200-20 MG/5ML PO SUSP: 30.0000 mL | ORAL | Status: DC | PRN

## 2018-05-19 MED ORDER — SODIUM CHLORIDE 0.9% FLUSH: 3.0000 mL | INTRAVENOUS | Status: DC | PRN

## 2018-05-19 MED ORDER — SODIUM CHLORIDE 0.9% FLUSH
3.0000 mL | Freq: Two times a day (BID) | INTRAVENOUS | Status: DC
  Administered 2018-05-19 – 2018-05-20 (×2): 3 mL via INTRAVENOUS

## 2018-05-19 MED ORDER — ORAL CARE MOUTH RINSE
15.0000 mL | Freq: Two times a day (BID) | OROMUCOSAL | Status: DC
  Administered 2018-05-19 – 2018-05-20 (×3): 15 mL via OROMUCOSAL

## 2018-05-19 MED ORDER — FLUTICASONE PROPIONATE 50 MCG/ACT NA SUSP
1.0000 | Freq: Every day | NASAL | Status: DC
  Administered 2018-05-19 – 2018-05-20 (×2): 1 via NASAL
  Filled 2018-05-19: qty 16

## 2018-05-19 NOTE — Progress Notes (Signed)
No issues/concerns this shift. Respiratory status stable; diminished and clear.

## 2018-05-19 NOTE — Progress Notes (Signed)
[facility] [place] NAME: Bethany Lambert    MR#:  [id number]  DATE OF BIRTH:  11-17-1934  SUBJECTIVE:   Here due to worsening shortness of breath and weakness.  Was recently treated with oral clindamycin for pneumonia but did not improve and therefore was admitted.  Sodium improved to 130 today.  REVIEW OF SYSTEMS:    Review of Systems  Constitutional: Negative for chills and fever.  HENT: Negative for congestion and tinnitus.   Eyes: Negative for blurred vision and double vision.  Respiratory: Positive for shortness of breath. Negative for cough and wheezing.   Cardiovascular: Negative for chest pain, orthopnea and PND.  Gastrointestinal: Negative for abdominal pain, diarrhea, nausea and vomiting.  Genitourinary: Negative for dysuria and hematuria.  Neurological: Negative for dizziness, sensory change and focal weakness.  All other systems reviewed and are negative.   Nutrition: Heart Healthy Tolerating Diet: Yes Tolerating PT: Await Eval  DRUG ALLERGIES:   Allergies  Allergen Reactions  . Ciprofloxacin   . Fluocinolone Other (See Comments)  . Norvasc [Amlodipine Besylate]     VITALS:  Blood pressure (!) 143/55, pulse 93, temperature 99.6 F (37.6 C), temperature source Oral, resp. rate 20, height 4\' 10"  (1.473 m), weight 50.8 kg (112 lb), SpO2 100 %.  PHYSICAL EXAMINATION:   Physical Exam  GENERAL:  84 y.o.-year-old patient lying in bed in no acute distress.  EYES: Pupils equal, round, reactive to light and accommodation. No scleral icterus. Extraocular muscles intact.  HEENT: Head atraumatic, normocephalic. Oropharynx and nasopharynx clear.  NECK:  Supple, no jugular venous distention. No thyroid enlargement, no tenderness.  LUNGS: Normal breath sounds bilaterally, no wheezing, rales, rhonchi. No use of accessory muscles of respiration.  CARDIOVASCULAR: S1, S2 normal. No murmurs, rubs, or gallops.  ABDOMEN: Soft,  nontender, nondistended. Bowel sounds present. No organomegaly or mass.  EXTREMITIES: No cyanosis, clubbing or edema b/l.    NEUROLOGIC: Cranial nerves II through XII are intact. No focal Motor or sensory deficits b/l.  Globally weak PSYCHIATRIC: The patient is alert and oriented x 3.  SKIN: No obvious rash, lesion, or ulcer.    LABORATORY PANEL:   CBC Recent Labs  Lab 05/19/18 0502  WBC 6.5  HGB 8.3*  HCT 23.6*  PLT 297   ------------------------------------------------------------------------------------------------------------------  Chemistries  Recent Labs  Lab 05/18/18 1631 05/19/18 0502  NA 128* 130*  K 4.1 3.7  CL 90* 98*  CO2 26 23  GLUCOSE 124* 76  BUN 11 8  CREATININE 0.89 0.82  CALCIUM 8.8* 7.9*  AST 20  --   ALT 17  --   ALKPHOS 60  --   BILITOT 0.5  --    ------------------------------------------------------------------------------------------------------------------  Cardiac Enzymes No results for input(s): TROPONINI in the last 168 hours. ------------------------------------------------------------------------------------------------------------------  RADIOLOGY:  Ct Abdomen Pelvis Wo Contrast  Result Date: 05/18/2018 CLINICAL DATA:  Abdominal distension for over 2 weeks. EXAM: CT ABDOMEN AND PELVIS WITHOUT CONTRAST TECHNIQUE: Multidetector CT imaging of the abdomen and pelvis was performed following the standard protocol without IV contrast. COMPARISON:  CT abdomen and pelvis 07/06/2007. PET CT scan 03/02/2016. FINDINGS: Lower chest: Emphysematous disease is present. 0.4 cm nodular opacity in the right lower and a 0.3 cm nodular opacity in the left lower lobe are seen on image 1. Lung bases are otherwise unremarkable. Hepatobiliary: No focal liver abnormality is seen. No gallstones, gallbladder wall thickening, or biliary dilatation. Pancreas: 2 cystic lesions in the tail the pancreas are  unchanged since the prior PET CT scan and likely due to some  prior inflammatory process. The pancreas is otherwise unremarkable. Spleen: Normal in size without focal abnormality. Adrenals/Urinary Tract: Tiny right adrenal adenoma is unchanged. The left adrenal gland is unremarkable. 2-3 punctate calcifications in the right kidney could be tiny nonobstructing stones or vascular. 2 punctate calcifications in the left kidney could also be vascular or nonobstructing stones. There is no hydronephrosis. No ureteral stone. 0.7 cm lesion in the midpole the left kidney demonstrates increased density and is likely a complex cyst. Urinary bladder appears normal. Stomach/Bowel: Sigmoid diverticulosis without diverticulitis is identified. Postoperative change the hepatic flexure of the colon appears unchanged. The stomach, small bowel and appendix appear normal. Vascular/Lymphatic: Extensive atherosclerotic vascular disease is present. No aneurysm. No lymphadenopathy. Reproductive: Status post hysterectomy. No adnexal masses. Other: No fluid collection. Musculoskeletal: No acute or focal bony abnormality. Bilateral hip osteoarthritis is seen. IMPRESSION: No acute abnormality abdomen or pelvis. No finding to explain the patient's symptoms. 0.4 cm right lower lobe and 0.3 cm left lower lobe nodules are not seen on the prior exams. No follow-up needed if patient is low-risk (and has no known or suspected primary neoplasm). Non-contrast chest CT can be considered in 12 months if patient is high-risk. This recommendation follows the consensus statement: Guidelines for Management of Incidental Pulmonary Nodules Detected on CT Images: From the Fleischner Society 2017; Radiology 2017; 284:228-243. Extensive atherosclerosis. Punctate nonobstructing stones versus vascular calcifications in the kidneys. Emphysema. Electronically Signed   By: Inge Rise M.D.   On: 05/18/2018 18:39   Dg Chest 2 View  Result Date: 05/18/2018 CLINICAL DATA:  Dyspnea, history of bilateral upper lobe lung cancer  EXAM: CHEST - 2 VIEW COMPARISON:  05/04/2018 chest radiograph. FINDINGS: Surgical clips overlie the right upper quadrant of the abdomen. Stable cardiomediastinal silhouette with normal heart size. No pneumothorax. No pleural effusion. Hyperinflated lungs. Emphysema. Patchy posterior left upper lobe consolidation, increased since 05/04/2018, superimposed on chronic left upper perihilar opacity. IMPRESSION: 1. Increased patchy posterior left upper lobe consolidation since 05/04/2018 chest radiographs, presumably representing pneumonia as suggested on 05/04/2018 chest CT. 2. This finding is superimposed on chronic left upper perihilar opacity compatible with post treatment change. 3. Short-term follow-up chest CT with IV contrast suggested after a course of antibiotic therapy in 1-2 months to document resolution and to exclude recurrent tumor. 4. Hyperinflated lungs and emphysema, suggesting COPD. Electronically Signed   By: Ilona Sorrel M.D.   On: 05/18/2018 17:29     ASSESSMENT AND PLAN:   Bethany Lambert  is a 84 y.o. female with a known history of COPD on 2 L home oxygen, history of stage III squamous cell lung cancer on the left side status post chemoradiation, also history of right lung nodule status post focus radiation therapy, GERD who presents to hospital secondary to worsening shortness of breath and weakness.  1.  Community-acquired pneumonia-chest x-ray showing left upper lobe consolidation worse than prior x-ray. -Was on clindamycin as outpatient with no significant improvement.   - cont. IV Rocephin, Zithromax and follow cultures.  - currently afebrile and WBC count normal.   2.  Generalized weakness-secondary to underlying COPD, deconditioning. -Await PT consult to assess mobility.  3.  Chronic COPD-significant emphysematous changes on her chest x-ray.  Continue supplemental oxygen which is her chronic home O2. -Continue nebs and inhalers.  no indication for systemic steroids at this  time.  4.  Hyponatremia- improved w/ IV fluids and sodium up  to 130 and will d/c fluids for now.  - encourage PO intake.   4.  Squamous cell lung cancer-status post chemoradiation.  Follows with oncology.  Currently in observation.  5.  Tachycardia-continue Cardizem and metoprolol - HR stable   Possible d/c home tomrorow.    All the records are reviewed and case discussed with Care Management/Social Worker. Management plans discussed with the patient, family and they are in agreement.  CODE STATUS: DNR  DVT Prophylaxis: Lovenox  TOTAL TIME TAKING CARE OF THIS PATIENT: 30 minutes.   POSSIBLE D/C IN 1-2 DAYS, DEPENDING ON CLINICAL CONDITION.   Henreitta Leber M.D on 05/19/2018 at 11:57 AM  Between 7am to 6pm - Pager - [phone]  After 6pm go to [email or web address] - Proofreader  [facility]  Office  [phone]  CC: Primary care physician; Baxter Hire, MD

## 2018-05-19 NOTE — Evaluation (Signed)
Physical Therapy Evaluation Patient Details Name: Bethany Lambert MRN: [id number] DOB: [birth year] Today's Date: 05/19/2018   History of Present Illness  84 yo female with onset of SOB, persistent PNA and kidney stones was admitted.  PMHx:  atherosclerosis, COPD, lung CA, HTN, bronchitis, emphysema, chemo, radiation, home O2, tachycardia,   Clinical Impression  Pt was seen for assessment of her tolerance for mobility and noted that she did not drop below 89% with room air to climb steps on the hall.  Her plan is to go home with HHPT and will work as acute care pt on strength and gait endurance with monitoring of O2 with room air.  Replaced her cannula after gait due to resting sat of 91%.  Nursing made aware.    Follow Up Recommendations Home health PT;Supervision for mobility/OOB    Equipment Recommendations  None recommended by PT    Recommendations for Other Services       Precautions / Restrictions Precautions Precautions: Fall Restrictions Weight Bearing Restrictions: No      Mobility  Bed Mobility Overal bed mobility: Modified Independent                Transfers Overall transfer level: Modified independent Equipment used: Rolling walker (2 wheeled)                Ambulation/Gait Ambulation/Gait assistance: Min guard Gait Distance (Feet): 130 Feet Assistive device: Rolling walker (2 wheeled) Gait Pattern/deviations: Step-through pattern;Decreased stride length;Shuffle;Wide base of support Gait velocity: reduced Gait velocity interpretation: <1.31 ft/sec, indicative of household ambulator    Stairs Stairs: Yes Stairs assistance: Min guard Stair Management: One rail Right;Forwards;Step to pattern Number of Stairs: 3(up and down) General stair comments: slow progression with care  Wheelchair Mobility    Modified Rankin (Stroke Patients Only)       Balance Overall balance assessment: Needs assistance Sitting-balance support: Bilateral upper  extremity supported;Feet supported Sitting balance-Leahy Scale: Good     Standing balance support: Bilateral upper extremity supported;During functional activity Standing balance-Leahy Scale: Fair                               Pertinent Vitals/Pain Pain Assessment: Faces Faces Pain Scale: No hurt    Home Living Family/patient expects to be discharged to:: Private residence Living Arrangements: Children;Other relatives Available Help at Discharge: Family;Available 24 hours/day Type of Home: House Home Access: Stairs to enter Entrance Stairs-Rails: Right;Left;Can reach both Entrance Stairs-Number of Steps: 3 Home Layout: One level Home Equipment: Walker - 2 wheels;Wheelchair - power      Prior Function Level of Independence: Independent with assistive device(s)               Hand Dominance   Dominant Hand: Right    Extremity/Trunk Assessment   Upper Extremity Assessment Upper Extremity Assessment: Generalized weakness    Lower Extremity Assessment Lower Extremity Assessment: Generalized weakness    Cervical / Trunk Assessment Cervical / Trunk Assessment: Kyphotic  Communication   Communication: No difficulties  Cognition Arousal/Alertness: Awake/alert Behavior During Therapy: WFL for tasks assessed/performed Overall Cognitive Status: Within Functional Limits for tasks assessed                                        General Comments      Exercises     Assessment/Plan    PT  Assessment Patient needs continued PT services  PT Problem List Decreased range of motion;Decreased strength;Decreased activity tolerance;Decreased balance;Decreased mobility;Decreased coordination;Decreased knowledge of use of DME;Decreased safety awareness;Cardiopulmonary status limiting activity       PT Treatment Interventions DME instruction;Gait training;Stair training;Functional mobility training;Therapeutic activities;Therapeutic  exercise;Balance training;Neuromuscular re-education;Patient/family education    PT Goals (Current goals can be found in the Care Plan section)  Acute Rehab PT Goals Patient Stated Goal: to get home and feel stronger PT Goal Formulation: With patient Time For Goal Achievement: 06/02/18 Potential to Achieve Goals: Good    Frequency Min 2X/week   Barriers to discharge Inaccessible home environment;Decreased caregiver support stairs and limited hellp at home    Co-evaluation               AM-PAC PT "6 Clicks" Daily Activity  Outcome Measure Difficulty turning over in bed (including adjusting bedclothes, sheets and blankets)?: A Little Difficulty moving from lying on back to sitting on the side of the bed? : Unable Difficulty sitting down on and standing up from a chair with arms (e.g., wheelchair, bedside commode, etc,.)?: Unable Help needed moving to and from a bed to chair (including a wheelchair)?: A Little Help needed walking in hospital room?: A Little Help needed climbing 3-5 steps with a railing? : A Little 6 Click Score: 14    End of Session Equipment Utilized During Treatment: Gait belt;Oxygen Activity Tolerance: Patient tolerated treatment well;Patient limited by fatigue Patient left: in bed;with call bell/phone within reach Nurse Communication: Mobility status PT Visit Diagnosis: Unsteadiness on feet (R26.81);Muscle weakness (generalized) (M62.81);Difficulty in walking, not elsewhere classified (R26.2)    Time: 1610-9604 PT Time Calculation (min) (ACUTE ONLY): 27 min   Charges:   PT Evaluation $PT Eval Moderate Complexity: 1 Mod PT Treatments $Gait Training: 8-22 mins   PT G Codes:   PT G-Codes **NOT FOR INPATIENT CLASS** Functional Assessment Tool Used: AM-PAC 6 Clicks Basic Mobility    Ramond Dial 05/19/2018, 11:35 PM   Mee Hives, PT MS Acute Rehab Dept. Number: [place] and [place]

## 2018-05-20 LAB — BASIC METABOLIC PANEL
Anion gap: 9 (ref 5–15)
BUN: 7 mg/dL (ref 6–20)
CALCIUM: 8.2 mg/dL — AB (ref 8.9–10.3)
CO2: 23 mmol/L (ref 22–32)
Chloride: 100 mmol/L — ABNORMAL LOW (ref 101–111)
Creatinine, Ser: 0.73 mg/dL (ref 0.44–1.00)
GFR calc Af Amer: >60 mL/min (ref >60)
Glucose, Bld: 80 mg/dL (ref 65–99)
Potassium: 3.4 mmol/L — ABNORMAL LOW (ref 3.5–5.1)
SODIUM: 132 mmol/L — AB (ref 135–145)

## 2018-05-20 MED ORDER — ALPRAZOLAM 0.25 MG PO TABS
0.2500 mg | ORAL_TABLET | Freq: Once | ORAL | Status: AC
  Administered 2018-05-20: 0.25 mg via ORAL
  Filled 2018-05-20: qty 1

## 2018-05-20 MED ORDER — DOXYCYCLINE HYCLATE 100 MG PO CAPS: 100.0000 mg | ORAL_CAPSULE | Freq: Two times a day (BID) | ORAL | 0 refills | Status: AC

## 2018-05-20 NOTE — Care Management Note (Signed)
Case Management Note  Patient Details  Name: Bethany Lambert MRN: [id number] Date of Birth: February 10, 1934  Subjective/Objective:         Patient to be discharged today per MD order. Orders in place for [facility] and PT. Patient given choice of agency and prefers [facility] given that she has oxygen set up via [facility]. Referral placed with Jermaine from [place]. Patient lives with son Bethany Lambert [phone]. No DME needs. PCP is Dr Ola Spurr. [place] [facility] and obtains medications without issue. Family to provide transport home. RNCM to sign off. Ines Bloomer RN BSN RNCM [phone]             Action/Plan:   Expected Discharge Date:  05/20/18               Expected Discharge Plan:     In-House Referral:     Discharge planning Services  CM Consult  Post Acute Care Choice:  Home Health Choice offered to:  Patient  DME Arranged:    DME Agency:     HH Arranged:  RN, PT [place] Agency:  [facility]  Status of Service:  Completed, signed off  If discussed at [place] of Stay Meetings, dates discussed:    Additional Comments:  Bethany Maudlin, RN 05/20/2018, 11:49 AM

## 2018-05-20 NOTE — Discharge Summary (Signed)
[facility] NAME: Bethany Lambert    MR#:  [id number]  DATE OF BIRTH:  1934/07/28  DATE OF ADMISSION:  05/18/2018 ADMITTING PHYSICIAN: Gladstone Lighter, MD  DATE OF DISCHARGE: 05/20/2018  PRIMARY CARE PHYSICIAN: Baxter Hire, MD    ADMISSION DIAGNOSIS:  Hyponatremia [E87.1] Hospital acquired PNA [J18.9]  DISCHARGE DIAGNOSIS:  Active Problems:   Pneumonia   SECONDARY DIAGNOSIS:   Past Medical History:  Diagnosis Date  . COPD (chronic obstructive pulmonary disease) (HCC)    on 2L home o2  . Erythrocytosis   . GERD (gastroesophageal reflux disease)   . History of chemotherapy   . History of radiation therapy   . Hyperlipidemia   . Hypertension   . Kidney failure   . Lung cancer ([place])    squamous, stage 3 non small cell lung ca  . Polycythemia   . Psoriasis     HOSPITAL COURSE:   Bethany Lambert a84 y.o.femalewith a known history of COPD on 2 L home oxygen, history of stage III squamous cell lung cancer on the left side status post chemoradiation, also history of right lung nodule status post focus radiation therapy, GERD who presents to hospital secondary to worsening shortness of breath and weakness.  1. Community-acquired pneumonia-patient presented to the hospital due to worsening shortness of breath and weakness and had finished a course of oral clindamycin without much improvement.  She was admitted to the hospital started on IV Rocephin, Zithromax. -She has been afebrile, her white cell count has been stable.  Her cultures are negative.  Her shortness of breath is stable and she therefore is being discharged on oral doxycycline for additional 10 days.    2.  Generalized weakness-secondary to underlying COPD, deconditioning. Seen by physical therapy and the recommend home health services which is being arranged for the patient prior to discharge.  3. Chronic COPD-significant emphysematous changes  on her chest x-ray. Continue supplemental oxygen which is her chronic home O2. - no acute exacerbation while in hospital.  Pt. Will cont. her albuterol inhaler along with Spiriva, duo nebs as needed. She will cont. Symbicort.   4. Hyponatremia- improved w/ IV fluids and now up to 132.     5. Squamous cell lung cancer- status post chemoradiation. Follows with oncology. Currently in observation.  6. Tachycardia- pt. Will continue Cardizem and metoprolol   DISCHARGE CONDITIONS:   Stable  CONSULTS OBTAINED:    DRUG ALLERGIES:   Allergies  Allergen Reactions  . Ciprofloxacin   . Fluocinolone Other (See Comments)  . Norvasc [Amlodipine Besylate]     DISCHARGE MEDICATIONS:   Allergies as of 05/20/2018      Reactions   Ciprofloxacin    Fluocinolone Other (See Comments)   Norvasc [amlodipine Besylate]       Medication List    STOP taking these medications   clindamycin 300 MG capsule Commonly known as:  CLEOCIN     TAKE these medications   albuterol 108 (90 Base) MCG/ACT inhaler Commonly known as:  PROVENTIL HFA;VENTOLIN HFA Inhale 2 puffs into the lungs every 6 (six) hours as needed for wheezing.   diltiazem 120 MG 24 hr capsule Commonly known as:  DILACOR XR Take 120 mg by mouth daily.   docusate sodium 100 MG capsule Commonly known as:  COLACE Take 100-200 mg by mouth 2 (two) times daily. 100 mg in the morning and 200 mg at bedtime   doxycycline 100 MG capsule Commonly known as:  VIBRAMYCIN Take 1 capsule (100 mg total) by mouth 2 (two) times daily for 10 days.   fluocinonide 0.05 % external solution Commonly known as:  LIDEX Apply 1 application topically 2 (two) times daily.   ipratropium-albuterol 0.5-2.5 (3) MG/3ML Soln Commonly known as:  DUONEB Take 3 mLs by nebulization every 6 (six) hours as needed (wheezing).   metoprolol tartrate 50 MG tablet Commonly known as:  LOPRESSOR Take 1 tablet (50 mg total) by mouth 2 (two) times daily.    omeprazole 20 MG capsule Commonly known as:  PRILOSEC Take 20 mg by mouth 2 (two) times daily before a meal.   potassium chloride 10 MEQ tablet Commonly known as:  K-DUR,KLOR-CON Take 10 mEq by mouth daily.   SYMBICORT 80-4.5 MCG/ACT inhaler Generic drug:  budesonide-formoterol USE TWO PUFFS TWO TIMES A DAY   tiotropium 18 MCG inhalation capsule Commonly known as:  SPIRIVA Place 18 mcg into inhaler and inhale daily.   VITAMIN B-12 IJ Inject 1,000 mcg as directed every 30 (thirty) days.         DISCHARGE INSTRUCTIONS:   DIET:  Cardiac diet  DISCHARGE CONDITION:  Stable  ACTIVITY:  Activity as tolerated  OXYGEN:  Home Oxygen: Yes.     Oxygen Delivery: 2 liters/min via Patient connected to nasal cannula oxygen  DISCHARGE LOCATION:  Home with Home Health PT   If you experience worsening of your admission symptoms, develop shortness of breath, life threatening emergency, suicidal or homicidal thoughts you must seek medical attention immediately by calling 911 or calling your MD immediately  if symptoms less severe.  You Must read complete instructions/literature along with all the possible adverse reactions/side effects for all the Medicines you take and that have been prescribed to you. Take any new Medicines after you have completely understood and accpet all the possible adverse reactions/side effects.   Please note  You were cared for by a hospitalist during your hospital stay. If you have any questions about your discharge medications or the care you received while you were in the hospital after you are discharged, you can call the unit and asked to speak with the hospitalist on call if the hospitalist that took care of you is not available. Once you are discharged, your primary care physician will handle any further medical issues. Please note that NO REFILLS for any discharge medications will be authorized once you are discharged, as it is imperative that you  return to your primary care physician (or establish a relationship with a primary care physician if you do not have one) for your aftercare needs so that they can reassess your need for medications and monitor your lab values.     Today   Shortness of breath improved.  No cough, fever.  Feels better since admission.  Will discharge home today with home health services.  VITAL SIGNS:  Blood pressure (!) 162/52, pulse 75, temperature 98.9 F (37.2 C), temperature source Oral, resp. rate 18, height 4\' 10"  (1.473 m), weight 50.8 kg (112 lb), SpO2 100 %.  I/O:    Intake/Output Summary (Last 24 hours) at 05/20/2018 1055 Last data filed at 05/19/2018 1410 Gross per 24 hour  Intake 50 ml  Output -  Net 50 ml    PHYSICAL EXAMINATION:  GENERAL:  84 y.o.-year-old patient lying in the bed with no acute distress.  EYES: Pupils equal, round, reactive to light and accommodation. No scleral icterus. Extraocular muscles intact.  HEENT: Head atraumatic, normocephalic. Oropharynx and nasopharynx clear.  NECK:  Supple, no jugular venous distention. No thyroid enlargement, no tenderness.  LUNGS: Normal breath sounds bilaterally, no wheezing, rales, rhonchi. No use of accessory muscles of respiration.  CARDIOVASCULAR: S1, S2 normal. No murmurs, rubs, or gallops.  ABDOMEN: Soft, non-tender, non-distended. Bowel sounds present. No organomegaly or mass.  EXTREMITIES: No pedal edema, cyanosis, or clubbing.  NEUROLOGIC: Cranial nerves II through XII are intact. No focal motor or sensory defecits b/l.  PSYCHIATRIC: The patient is alert and oriented x 3.   SKIN: No obvious rash, lesion, or ulcer.   DATA REVIEW:   CBC Recent Labs  Lab 05/19/18 0502  WBC 6.5  HGB 8.3*  HCT 23.6*  PLT 297    Chemistries  Recent Labs  Lab 05/18/18 1631  05/20/18 0509  NA 128*   < > 132*  K 4.1   < > 3.4*  CL 90*   < > 100*  CO2 26   < > 23  GLUCOSE 124*   < > 80  BUN 11   < > 7  CREATININE 0.89   < > 0.73   CALCIUM 8.8*   < > 8.2*  AST 20  --   --   ALT 17  --   --   ALKPHOS 60  --   --   BILITOT 0.5  --   --    < > = values in this interval not displayed.    Cardiac Enzymes No results for input(s): TROPONINI in the last 168 hours.  Microbiology Results  Results for orders placed or performed during the hospital encounter of 05/18/18  MRSA PCR Screening     Status: None   Collection Time: 05/18/18 11:36 PM  Result Value Ref Range Status   MRSA by PCR NEGATIVE NEGATIVE Final    Comment:        The GeneXpert MRSA Assay (FDA approved for NASAL specimens only), is one component of a comprehensive MRSA colonization surveillance program. It is not intended to diagnose MRSA infection nor to guide or monitor treatment for MRSA infections. Performed at [facility], [address] [place], [place] [address]     RADIOLOGY:  Ct Abdomen Pelvis Wo Contrast  Result Date: 05/18/2018 CLINICAL DATA:  Abdominal distension for over 2 weeks. EXAM: CT ABDOMEN AND PELVIS WITHOUT CONTRAST TECHNIQUE: Multidetector CT imaging of the abdomen and pelvis was performed following the standard protocol without IV contrast. COMPARISON:  CT abdomen and pelvis 07/06/2007. PET CT scan 03/02/2016. FINDINGS: Lower chest: Emphysematous disease is present. 0.4 cm nodular opacity in the right lower and a 0.3 cm nodular opacity in the left lower lobe are seen on image 1. Lung bases are otherwise unremarkable. Hepatobiliary: No focal liver abnormality is seen. No gallstones, gallbladder wall thickening, or biliary dilatation. Pancreas: 2 cystic lesions in the tail the pancreas are unchanged since the prior PET CT scan and likely due to some prior inflammatory process. The pancreas is otherwise unremarkable. Spleen: Normal in size without focal abnormality. Adrenals/Urinary Tract: Tiny right adrenal adenoma is unchanged. The left adrenal gland is unremarkable. 2-3 punctate calcifications in the right kidney could  be tiny nonobstructing stones or vascular. 2 punctate calcifications in the left kidney could also be vascular or nonobstructing stones. There is no hydronephrosis. No ureteral stone. 0.7 cm lesion in the midpole the left kidney demonstrates increased density and is likely a complex cyst. Urinary bladder appears normal. Stomach/Bowel: Sigmoid diverticulosis without diverticulitis is identified. Postoperative change the hepatic flexure of  the colon appears unchanged. The stomach, small bowel and appendix appear normal. Vascular/Lymphatic: Extensive atherosclerotic vascular disease is present. No aneurysm. No lymphadenopathy. Reproductive: Status post hysterectomy. No adnexal masses. Other: No fluid collection. Musculoskeletal: No acute or focal bony abnormality. Bilateral hip osteoarthritis is seen. IMPRESSION: No acute abnormality abdomen or pelvis. No finding to explain the patient's symptoms. 0.4 cm right lower lobe and 0.3 cm left lower lobe nodules are not seen on the prior exams. No follow-up needed if patient is low-risk (and has no known or suspected primary neoplasm). Non-contrast chest CT can be considered in 12 months if patient is high-risk. This recommendation follows the consensus statement: Guidelines for Management of Incidental Pulmonary Nodules Detected on CT Images: From the Fleischner Society 2017; Radiology 2017; 284:228-243. Extensive atherosclerosis. Punctate nonobstructing stones versus vascular calcifications in the kidneys. Emphysema. Electronically Signed   By: Inge Rise M.D.   On: 05/18/2018 18:39   Dg Chest 2 View  Result Date: 05/18/2018 CLINICAL DATA:  Dyspnea, history of bilateral upper lobe lung cancer EXAM: CHEST - 2 VIEW COMPARISON:  05/04/2018 chest radiograph. FINDINGS: Surgical clips overlie the right upper quadrant of the abdomen. Stable cardiomediastinal silhouette with normal heart size. No pneumothorax. No pleural effusion. Hyperinflated lungs. Emphysema. Patchy  posterior left upper lobe consolidation, increased since 05/04/2018, superimposed on chronic left upper perihilar opacity. IMPRESSION: 1. Increased patchy posterior left upper lobe consolidation since 05/04/2018 chest radiographs, presumably representing pneumonia as suggested on 05/04/2018 chest CT. 2. This finding is superimposed on chronic left upper perihilar opacity compatible with post treatment change. 3. Short-term follow-up chest CT with IV contrast suggested after a course of antibiotic therapy in 1-2 months to document resolution and to exclude recurrent tumor. 4. Hyperinflated lungs and emphysema, suggesting COPD. Electronically Signed   By: Ilona Sorrel M.D.   On: 05/18/2018 17:29      Management plans discussed with the patient, family and they are in agreement.  CODE STATUS:     Code Status Orders  (From admission, onward)        Start     Ordered   05/18/18 2344  Do not attempt resuscitation (DNR)  Continuous    Question Answer Comment  In the event of cardiac or respiratory ARREST Do not call a "code blue"   In the event of cardiac or respiratory ARREST Do not perform Intubation, CPR, defibrillation or ACLS   In the event of cardiac or respiratory ARREST Use medication by any route, position, wound care, and other measures to relive Lambert and suffering. May use oxygen, suction and manual treatment of airway obstruction as needed for comfort.   Comments confirmed with patient      05/18/18 2344     TOTAL TIME TAKING CARE OF THIS PATIENT: 40 minutes.    Henreitta Leber M.D on 05/20/2018 at 10:55 AM  Between 7am to 6pm - Pager - [phone]  After 6pm go to [email or web address] - Proofreader  [facility]  Office  [phone]  CC: Primary care physician; Baxter Hire, MD

## 2018-05-20 NOTE — Progress Notes (Signed)
Oral and written AVS instructions given to pt with one rx with stated understanding. Family brought pt's 02 tank and discharged home to self care with [facility]. Transported in transport chair to private vehicle.

## 2018-05-20 NOTE — Discharge Instructions (Signed)

## 2018-05-23 ENCOUNTER — Telehealth: Payer: Self-pay

## 2018-05-23 NOTE — Telephone Encounter (Signed)
EMMI Follow-up: Noted on the report that patient had questions about discharge papers and follow-up appointment. I talked with Bethany Lambert and she was discharged on Sunday and still not feeling well.  Was hoping 2nd medication would clear up the bacterial infection in her lungs as the last one she took for 30 days did not help her.  Called to make an follow-up appointment as noted within 1 week but can't get an appointment until the end of September. She said Bethany Lambert was leaving the service to go to [place] for 3 years and Bethany Lambert was suppose to be the doctor that would be following her. Didn't feel like she could wait that long and did not want to return to the Emergency Room.  I gave her the [facility] Physician Referral Service # [phone] if she wanted to reach out to another MD to see if she could get an earlier appointment.  Also, her follow-up appointment with Bethany Lambert is not till July 15th. I asked her to let us know if she had any concerns with 2nd automated call and we would call her back.  She was glad [facility] was coming out this afternoon. No other concerns this afternoon.

## 2018-05-24 ENCOUNTER — Other Ambulatory Visit: Payer: Self-pay

## 2018-05-24 ENCOUNTER — Telehealth: Payer: Self-pay

## 2018-05-24 DIAGNOSIS — E785 Hyperlipidemia, unspecified: Secondary | ICD-10-CM | POA: Diagnosis not present

## 2018-05-24 DIAGNOSIS — I10 Essential (primary) hypertension: Secondary | ICD-10-CM | POA: Diagnosis not present

## 2018-05-24 DIAGNOSIS — Z9981 Dependence on supplemental oxygen: Secondary | ICD-10-CM | POA: Diagnosis not present

## 2018-05-24 DIAGNOSIS — J40 Bronchitis, not specified as acute or chronic: Secondary | ICD-10-CM | POA: Diagnosis not present

## 2018-05-24 DIAGNOSIS — Z792 Long term (current) use of antibiotics: Secondary | ICD-10-CM | POA: Diagnosis not present

## 2018-05-24 DIAGNOSIS — Z9181 History of falling: Secondary | ICD-10-CM | POA: Diagnosis not present

## 2018-05-24 DIAGNOSIS — J189 Pneumonia, unspecified organism: Secondary | ICD-10-CM | POA: Diagnosis not present

## 2018-05-24 DIAGNOSIS — J439 Emphysema, unspecified: Secondary | ICD-10-CM | POA: Diagnosis not present

## 2018-05-24 DIAGNOSIS — C3492 Malignant neoplasm of unspecified part of left bronchus or lung: Secondary | ICD-10-CM | POA: Diagnosis not present

## 2018-05-24 NOTE — Telephone Encounter (Signed)
[facility]  PT  Called he want to know pt oxygen how many litre she is on I advised on 2 litre prnpt o2 was 96 in room air she came home trom hospital I advised she need to use oxygen if she is low

## 2018-05-27 DIAGNOSIS — J449 Chronic obstructive pulmonary disease, unspecified: Secondary | ICD-10-CM | POA: Diagnosis not present

## 2018-05-30 ENCOUNTER — Telehealth: Payer: Self-pay

## 2018-05-30 DIAGNOSIS — J40 Bronchitis, not specified as acute or chronic: Secondary | ICD-10-CM | POA: Diagnosis not present

## 2018-05-30 DIAGNOSIS — I10 Essential (primary) hypertension: Secondary | ICD-10-CM | POA: Diagnosis not present

## 2018-05-30 DIAGNOSIS — Z9981 Dependence on supplemental oxygen: Secondary | ICD-10-CM | POA: Diagnosis not present

## 2018-05-30 DIAGNOSIS — C3492 Malignant neoplasm of unspecified part of left bronchus or lung: Secondary | ICD-10-CM | POA: Diagnosis not present

## 2018-05-30 DIAGNOSIS — J439 Emphysema, unspecified: Secondary | ICD-10-CM | POA: Diagnosis not present

## 2018-05-30 DIAGNOSIS — E785 Hyperlipidemia, unspecified: Secondary | ICD-10-CM | POA: Diagnosis not present

## 2018-05-30 DIAGNOSIS — Z9181 History of falling: Secondary | ICD-10-CM | POA: Diagnosis not present

## 2018-05-30 DIAGNOSIS — J189 Pneumonia, unspecified organism: Secondary | ICD-10-CM | POA: Diagnosis not present

## 2018-05-30 DIAGNOSIS — Z792 Long term (current) use of antibiotics: Secondary | ICD-10-CM | POA: Diagnosis not present

## 2018-05-30 NOTE — Telephone Encounter (Signed)
Patient was flagged in EMMI report due to feeling sad and hopeless. CSW attempted to call patient but there was no answer and no voicemail.   Bethany Lambert, [place]

## 2018-05-31 ENCOUNTER — Emergency Department: Payer: Medicare Other

## 2018-05-31 ENCOUNTER — Inpatient Hospital Stay
Admission: EM | Admit: 2018-05-31 | Discharge: 2018-06-04 | DRG: 640 | Disposition: A | Discharge status: Home Health | Payer: Medicare Other | Attending: Internal Medicine | Admitting: Internal Medicine

## 2018-05-31 ENCOUNTER — Other Ambulatory Visit: Payer: Self-pay

## 2018-05-31 DIAGNOSIS — R4182 Altered mental status, unspecified: Secondary | ICD-10-CM

## 2018-05-31 DIAGNOSIS — R918 Other nonspecific abnormal finding of lung field: Secondary | ICD-10-CM | POA: Diagnosis not present

## 2018-05-31 DIAGNOSIS — D751 Secondary polycythemia: Secondary | ICD-10-CM | POA: Diagnosis present

## 2018-05-31 DIAGNOSIS — R0602 Shortness of breath: Secondary | ICD-10-CM

## 2018-05-31 DIAGNOSIS — Z8249 Family history of ischemic heart disease and other diseases of the circulatory system: Secondary | ICD-10-CM | POA: Diagnosis not present

## 2018-05-31 DIAGNOSIS — Z79899 Other long term (current) drug therapy: Secondary | ICD-10-CM | POA: Diagnosis not present

## 2018-05-31 DIAGNOSIS — J439 Emphysema, unspecified: Secondary | ICD-10-CM | POA: Diagnosis not present

## 2018-05-31 DIAGNOSIS — J159 Unspecified bacterial pneumonia: Secondary | ICD-10-CM | POA: Diagnosis not present

## 2018-05-31 DIAGNOSIS — Z881 Allergy status to other antibiotic agents status: Secondary | ICD-10-CM | POA: Diagnosis not present

## 2018-05-31 DIAGNOSIS — R531 Weakness: Secondary | ICD-10-CM | POA: Diagnosis not present

## 2018-05-31 DIAGNOSIS — E785 Hyperlipidemia, unspecified: Secondary | ICD-10-CM | POA: Diagnosis present

## 2018-05-31 DIAGNOSIS — R410 Disorientation, unspecified: Secondary | ICD-10-CM | POA: Diagnosis not present

## 2018-05-31 DIAGNOSIS — F0390 Unspecified dementia without behavioral disturbance: Secondary | ICD-10-CM | POA: Diagnosis present

## 2018-05-31 DIAGNOSIS — Z9981 Dependence on supplemental oxygen: Secondary | ICD-10-CM

## 2018-05-31 DIAGNOSIS — Z923 Personal history of irradiation: Secondary | ICD-10-CM | POA: Diagnosis not present

## 2018-05-31 DIAGNOSIS — Z800 Family history of malignant neoplasm of digestive organs: Secondary | ICD-10-CM

## 2018-05-31 DIAGNOSIS — Z85118 Personal history of other malignant neoplasm of bronchus and lung: Secondary | ICD-10-CM | POA: Diagnosis not present

## 2018-05-31 DIAGNOSIS — E871 Hypo-osmolality and hyponatremia: Secondary | ICD-10-CM | POA: Diagnosis not present

## 2018-05-31 DIAGNOSIS — Z9221 Personal history of antineoplastic chemotherapy: Secondary | ICD-10-CM | POA: Diagnosis not present

## 2018-05-31 DIAGNOSIS — E878 Other disorders of electrolyte and fluid balance, not elsewhere classified: Secondary | ICD-10-CM | POA: Diagnosis present

## 2018-05-31 DIAGNOSIS — I10 Essential (primary) hypertension: Secondary | ICD-10-CM | POA: Diagnosis not present

## 2018-05-31 DIAGNOSIS — R413 Other amnesia: Secondary | ICD-10-CM | POA: Diagnosis present

## 2018-05-31 DIAGNOSIS — K219 Gastro-esophageal reflux disease without esophagitis: Secondary | ICD-10-CM | POA: Diagnosis present

## 2018-05-31 DIAGNOSIS — Z823 Family history of stroke: Secondary | ICD-10-CM

## 2018-05-31 DIAGNOSIS — Z888 Allergy status to other drugs, medicaments and biological substances status: Secondary | ICD-10-CM

## 2018-05-31 DIAGNOSIS — J449 Chronic obstructive pulmonary disease, unspecified: Secondary | ICD-10-CM | POA: Diagnosis not present

## 2018-05-31 DIAGNOSIS — J9611 Chronic respiratory failure with hypoxia: Secondary | ICD-10-CM | POA: Diagnosis present

## 2018-05-31 DIAGNOSIS — Z66 Do not resuscitate: Secondary | ICD-10-CM | POA: Diagnosis present

## 2018-05-31 DIAGNOSIS — E876 Hypokalemia: Secondary | ICD-10-CM | POA: Diagnosis present

## 2018-05-31 DIAGNOSIS — E870 Hyperosmolality and hypernatremia: Secondary | ICD-10-CM | POA: Diagnosis not present

## 2018-05-31 DIAGNOSIS — F419 Anxiety disorder, unspecified: Secondary | ICD-10-CM | POA: Diagnosis present

## 2018-05-31 DIAGNOSIS — Z87891 Personal history of nicotine dependence: Secondary | ICD-10-CM | POA: Diagnosis not present

## 2018-05-31 DIAGNOSIS — C3490 Malignant neoplasm of unspecified part of unspecified bronchus or lung: Secondary | ICD-10-CM | POA: Diagnosis not present

## 2018-05-31 DIAGNOSIS — Z9849 Cataract extraction status, unspecified eye: Secondary | ICD-10-CM | POA: Diagnosis not present

## 2018-05-31 DIAGNOSIS — Z90710 Acquired absence of both cervix and uterus: Secondary | ICD-10-CM

## 2018-05-31 DIAGNOSIS — G9341 Metabolic encephalopathy: Secondary | ICD-10-CM | POA: Diagnosis present

## 2018-05-31 DIAGNOSIS — J9610 Chronic respiratory failure, unspecified whether with hypoxia or hypercapnia: Secondary | ICD-10-CM | POA: Diagnosis not present

## 2018-05-31 DIAGNOSIS — Z7951 Long term (current) use of inhaled steroids: Secondary | ICD-10-CM | POA: Diagnosis not present

## 2018-05-31 DIAGNOSIS — J189 Pneumonia, unspecified organism: Secondary | ICD-10-CM | POA: Diagnosis not present

## 2018-05-31 LAB — BASIC METABOLIC PANEL
Anion gap: 11 (ref 5–15)
BUN: 12 mg/dL (ref 8–23)
CHLORIDE: 81 mmol/L — AB (ref 98–111)
CO2: 25 mmol/L (ref 22–32)
CREATININE: 0.59 mg/dL (ref 0.44–1.00)
Calcium: 8.4 mg/dL — ABNORMAL LOW (ref 8.9–10.3)
GFR calc Af Amer: >60 mL/min (ref >60)
GFR calc non Af Amer: >60 mL/min (ref >60)
GLUCOSE: 101 mg/dL — AB (ref 70–99)
POTASSIUM: 3.4 mmol/L — AB (ref 3.5–5.1)
Sodium: 117 mmol/L — CL (ref 135–145)

## 2018-05-31 LAB — CBC WITH DIFFERENTIAL/PLATELET
Basophils Absolute: 0 K/uL (ref 0–0.1)
Basophils Relative: 1 %
Eosinophils Absolute: 0 K/uL (ref 0–0.7)
Eosinophils Relative: 0 %
HCT: 30.3 % — ABNORMAL LOW (ref 35.0–47.0)
Hemoglobin: 11.1 g/dL — ABNORMAL LOW (ref 12.0–16.0)
Lymphocytes Relative: 18 %
Lymphs Abs: 1.3 K/uL (ref 1.0–3.6)
MCH: 32.3 pg (ref 26.0–34.0)
MCHC: 36.5 g/dL — ABNORMAL HIGH (ref 32.0–36.0)
MCV: 88.7 fL (ref 80.0–100.0)
Monocytes Absolute: 0.6 K/uL (ref 0.2–0.9)
Monocytes Relative: 8 %
Neutro Abs: 5.1 K/uL (ref 1.4–6.5)
Neutrophils Relative %: 73 %
Platelets: 313 K/uL (ref 150–440)
RBC: 3.42 MIL/uL — ABNORMAL LOW (ref 3.80–5.20)
RDW: 13.5 % (ref 11.5–14.5)
WBC: 7 K/uL (ref 3.6–11.0)

## 2018-05-31 LAB — URINALYSIS, COMPLETE (UACMP) WITH MICROSCOPIC
Bilirubin Urine: NEGATIVE
Glucose, UA: NEGATIVE mg/dL
Ketones, ur: NEGATIVE mg/dL
Leukocytes, UA: NEGATIVE
Nitrite: NEGATIVE
Protein, ur: 30 mg/dL — AB
Specific Gravity, Urine: 1.012 (ref 1.005–1.030)
pH: 7 (ref 5.0–8.0)

## 2018-05-31 LAB — AMMONIA: Ammonia: <9 umol/L — ABNORMAL LOW (ref 9–35)

## 2018-05-31 LAB — PREALBUMIN: Prealbumin: 20 mg/dL (ref 18–38)

## 2018-05-31 LAB — TSH: TSH: 0.69 u[IU]/mL (ref 0.350–4.500)

## 2018-05-31 MED ORDER — MOMETASONE FURO-FORMOTEROL FUM 100-5 MCG/ACT IN AERO
2.0000 | INHALATION_SPRAY | Freq: Two times a day (BID) | RESPIRATORY_TRACT | Status: DC
  Administered 2018-05-31 – 2018-06-04 (×8): 2 via RESPIRATORY_TRACT
  Filled 2018-05-31: qty 8.8

## 2018-05-31 MED ORDER — SODIUM CHLORIDE 0.9 % IV SOLN
Freq: Once | INTRAVENOUS | Status: AC
  Administered 2018-05-31: 17:00:00 via INTRAVENOUS

## 2018-05-31 MED ORDER — ACETAMINOPHEN 325 MG PO TABS: 650.0000 mg | ORAL_TABLET | Freq: Four times a day (QID) | ORAL | Status: DC | PRN

## 2018-05-31 MED ORDER — IPRATROPIUM-ALBUTEROL 0.5-2.5 (3) MG/3ML IN SOLN: 3.0000 mL | Freq: Four times a day (QID) | RESPIRATORY_TRACT | Status: DC | PRN

## 2018-05-31 MED ORDER — POTASSIUM CHLORIDE IN NACL 20-0.9 MEQ/L-% IV SOLN
INTRAVENOUS | Status: DC
  Administered 2018-06-01 – 2018-06-02 (×3): via INTRAVENOUS
  Filled 2018-05-31 (×6): qty 1000

## 2018-05-31 MED ORDER — HYDRALAZINE HCL 50 MG PO TABS
25.0000 mg | ORAL_TABLET | Freq: Once | ORAL | Status: AC
  Administered 2018-05-31: 25 mg via ORAL

## 2018-05-31 MED ORDER — DOCUSATE SODIUM 100 MG PO CAPS
100.0000 mg | ORAL_CAPSULE | Freq: Two times a day (BID) | ORAL | Status: DC
  Administered 2018-05-31 – 2018-06-01 (×3): 100 mg via ORAL
  Administered 2018-06-02 – 2018-06-03 (×3): 200 mg via ORAL
  Administered 2018-06-03 – 2018-06-04 (×2): 100 mg via ORAL
  Filled 2018-05-31 (×4): qty 1
  Filled 2018-05-31: qty 2
  Filled 2018-05-31: qty 1
  Filled 2018-05-31 (×2): qty 2

## 2018-05-31 MED ORDER — ONDANSETRON HCL 4 MG PO TABS: 4.0000 mg | ORAL_TABLET | Freq: Four times a day (QID) | ORAL | Status: DC | PRN

## 2018-05-31 MED ORDER — POTASSIUM CHLORIDE CRYS ER 10 MEQ PO TBCR
10.0000 meq | EXTENDED_RELEASE_TABLET | Freq: Every day | ORAL | Status: DC
  Administered 2018-06-01 – 2018-06-04 (×4): 10 meq via ORAL
  Filled 2018-05-31 (×4): qty 1

## 2018-05-31 MED ORDER — HYDRALAZINE HCL 50 MG PO TABS
ORAL_TABLET | ORAL | Status: AC
  Administered 2018-05-31: 25 mg via ORAL
  Filled 2018-05-31: qty 1

## 2018-05-31 MED ORDER — DILTIAZEM HCL ER COATED BEADS 120 MG PO CP24
120.0000 mg | ORAL_CAPSULE | ORAL | Status: DC
  Administered 2018-06-01 – 2018-06-04 (×4): 120 mg via ORAL
  Filled 2018-05-31 (×5): qty 1

## 2018-05-31 MED ORDER — PANTOPRAZOLE SODIUM 40 MG PO TBEC
40.0000 mg | DELAYED_RELEASE_TABLET | Freq: Every day | ORAL | Status: DC
  Administered 2018-06-01 – 2018-06-04 (×4): 40 mg via ORAL
  Filled 2018-05-31 (×4): qty 1

## 2018-05-31 MED ORDER — ACETAMINOPHEN 650 MG RE SUPP: 650.0000 mg | Freq: Four times a day (QID) | RECTAL | Status: DC | PRN

## 2018-05-31 MED ORDER — SODIUM CHLORIDE 1 G PO TABS
2.0000 g | ORAL_TABLET | Freq: Three times a day (TID) | ORAL | Status: AC
  Administered 2018-05-31 – 2018-06-02 (×5): 2 g via ORAL
  Filled 2018-05-31 (×6): qty 2

## 2018-05-31 MED ORDER — ONDANSETRON HCL 4 MG/2ML IJ SOLN: 4.0000 mg | Freq: Four times a day (QID) | INTRAMUSCULAR | Status: DC | PRN

## 2018-05-31 MED ORDER — FLUOCINONIDE 0.05 % EX SOLN
1.0000 "application " | Freq: Two times a day (BID) | CUTANEOUS | Status: DC | PRN
  Filled 2018-05-31: qty 60

## 2018-05-31 MED ORDER — TIOTROPIUM BROMIDE MONOHYDRATE 18 MCG IN CAPS
18.0000 ug | ORAL_CAPSULE | Freq: Every day | RESPIRATORY_TRACT | Status: DC
  Administered 2018-06-01 – 2018-06-04 (×4): 18 ug via RESPIRATORY_TRACT
  Filled 2018-05-31: qty 5

## 2018-05-31 MED ORDER — POLYETHYLENE GLYCOL 3350 17 G PO PACK: 17.0000 g | PACK | Freq: Every day | ORAL | Status: DC | PRN

## 2018-05-31 MED ORDER — HYDRALAZINE HCL 20 MG/ML IJ SOLN
10.0000 mg | INTRAMUSCULAR | Status: DC | PRN
  Administered 2018-06-03: 10 mg via INTRAVENOUS
  Filled 2018-05-31: qty 1

## 2018-05-31 MED ORDER — METOPROLOL TARTRATE 50 MG PO TABS
50.0000 mg | ORAL_TABLET | Freq: Two times a day (BID) | ORAL | Status: DC
  Administered 2018-05-31 – 2018-06-04 (×8): 50 mg via ORAL
  Filled 2018-05-31 (×8): qty 1

## 2018-05-31 MED ORDER — ENOXAPARIN SODIUM 40 MG/0.4ML ~~LOC~~ SOLN
40.0000 mg | SUBCUTANEOUS | Status: DC
  Administered 2018-05-31 – 2018-06-03 (×4): 40 mg via SUBCUTANEOUS
  Filled 2018-05-31 (×4): qty 0.4

## 2018-05-31 MED ORDER — ADULT MULTIVITAMIN W/MINERALS CH
1.0000 | ORAL_TABLET | Freq: Every day | ORAL | Status: DC
  Administered 2018-06-01 – 2018-06-04 (×4): 1 via ORAL
  Filled 2018-05-31 (×4): qty 1

## 2018-05-31 MED ORDER — TRAZODONE HCL 50 MG PO TABS: 50.0000 mg | ORAL_TABLET | Freq: Every evening | ORAL | Status: DC | PRN

## 2018-05-31 MED ORDER — ALBUTEROL SULFATE (2.5 MG/3ML) 0.083% IN NEBU: 3.0000 mL | INHALATION_SOLUTION | Freq: Four times a day (QID) | RESPIRATORY_TRACT | Status: DC | PRN

## 2018-05-31 NOTE — Plan of Care (Signed)

## 2018-05-31 NOTE — ED Triage Notes (Signed)
Pt was sent from Dr. Edwina Barth office with c/o not being herself, having trouble concentrating , with generalized weakness. States pt Na+ 119 today. Is currently on abx for pneumonia. Pt is 2L  continuous with sats 99-100.Marland Kitchen

## 2018-05-31 NOTE — H&P (Signed)
[facility] NAME: Bethany Lambert    MR#:  [id number]  DATE OF BIRTH:  06/06/1934  DATE OF ADMISSION:  05/31/2018  PRIMARY CARE PHYSICIAN: Baxter Hire, MD   REQUESTING/REFERRING PHYSICIAN:   CHIEF COMPLAINT:   Chief Complaint  Patient presents with  . Altered Mental Status    HISTORY OF PRESENT ILLNESS: Bethany Lambert  is a 84 y.o. female with a known history per below, discharged earlier this month on June 16 for hyponatremia and pneumonia, presents today with acute altered mental status, increased confusion noted by outpatient provider, was sent to the emergency room for further evaluation/care, sodium on work-up was noted to be 119, patient also complained of generalized weakness/fatigue, inability to do activities of daily living, inability to stand/ambulate, ER work-up noted for sodium 117 down from 130, potassium 3.4, chloride 81, UA was negative, CT head negative, hemoglobin 11 up from 8.3, patient evaluated in the emergency room, daughter at the bedside, patient in no apparent distress, resting comfortably in bed, patient is now being admitted for acute severe hyponatremia with associated encephalopathy.  PAST MEDICAL HISTORY:   Past Medical History:  Diagnosis Date  . COPD (chronic obstructive pulmonary disease) (HCC)    on 2L home o2  . Erythrocytosis   . GERD (gastroesophageal reflux disease)   . History of chemotherapy   . History of radiation therapy   . Hyperlipidemia   . Hypertension   . Kidney failure   . Lung cancer ([place])    squamous, stage 3 non small cell lung ca  . Polycythemia   . Psoriasis     PAST SURGICAL HISTORY:  Past Surgical History:  Procedure Laterality Date  . CATARACT EXTRACTION    . COLON RESECTION    . CT GUIDED BIOPSY  ([facility] HX)  11/19/2012   lung  . HERNIA REPAIR    . TOTAL ABDOMINAL HYSTERECTOMY      SOCIAL HISTORY:  Social History   Tobacco Use  . Smoking status: Former  Smoker    Packs/day: 2.00    Years: 40.00    Pack years: 80.00    Types: Cigarettes  . Smokeless tobacco: Never Used  Substance Use Topics  . Alcohol use: No    Alcohol/week: 0.0 oz    FAMILY HISTORY:  Family History  Problem Relation Age of Onset  . Heart attack Father   . Colon cancer Father   . Stroke Mother   . Cancer Son     DRUG ALLERGIES:  Allergies  Allergen Reactions  . Ciprofloxacin   . Fluocinolone Other (See Comments)  . Norvasc [Amlodipine Besylate]     REVIEW OF SYSTEMS:   CONSTITUTIONAL: No fever, +fatigue, weakness.  EYES: No blurred or double vision.  EARS, NOSE, AND THROAT: No tinnitus or ear pain.  RESPIRATORY: No cough, shortness of breath, wheezing or hemoptysis.  CARDIOVASCULAR: No chest pain, orthopnea, edema.  GASTROINTESTINAL: No nausea, vomiting, diarrhea or abdominal pain.  GENITOURINARY: No dysuria, hematuria.  ENDOCRINE: No polyuria, nocturia,  HEMATOLOGY: No anemia, easy bruising or bleeding SKIN: No rash or lesion. MUSCULOSKELETAL: No joint pain or arthritis.   NEUROLOGIC: No tingling, numbness, weakness. + Inability to concentrate, memory problems/deficits, confusion, inability to stand/ambulate PSYCHIATRY: No anxiety or depression.   MEDICATIONS AT HOME:  Prior to Admission medications   Medication Sig Start Date End Date Taking? Authorizing Provider  albuterol (PROVENTIL HFA;VENTOLIN HFA) 108 (90 Base) MCG/ACT inhaler Inhale 2 puffs into the  lungs every 6 (six) hours as needed for wheezing. 03/27/18  Yes Lavera Guise, MD  Cyanocobalamin (VITAMIN B-12 IJ) Inject 1,000 mcg as directed every 30 (thirty) days.    Yes [provider]  diltiazem (DILACOR XR) 120 MG 24 hr capsule Take 120 mg by mouth daily.   Yes [provider]  docusate sodium (COLACE) 100 MG capsule Take 100-200 mg by mouth 2 (two) times daily. 100 mg in the morning and 200 mg at bedtime   Yes [provider]  fluocinonide (LIDEX) 0.05 %  external solution Apply 1 application topically 2 (two) times daily.  08/18/16  Yes [provider]  ipratropium-albuterol (DUONEB) 0.5-2.5 (3) MG/3ML SOLN Take 3 mLs by nebulization every 6 (six) hours as needed (wheezing).    Yes [provider]  metoprolol (LOPRESSOR) 50 MG tablet Take 1 tablet (50 mg total) by mouth 2 (two) times daily. 01/31/13  Yes Wellington Hampshire, MD  omeprazole (PRILOSEC) 20 MG capsule Take 20 mg by mouth 2 (two) times daily before a meal.    Yes [provider]  OXYGEN Inhale 3 L into the lungs continuous.    Yes [provider]  potassium chloride (K-DUR,KLOR-CON) 10 MEQ tablet Take 10 mEq by mouth daily.   Yes [provider]  SYMBICORT 80-4.5 MCG/ACT inhaler USE TWO PUFFS TWO TIMES A DAY 03/15/18  Yes Boscia, Heather E, NP  tiotropium (SPIRIVA) 18 MCG inhalation capsule Place 18 mcg into inhaler and inhale daily.   Yes [provider]      PHYSICAL EXAMINATION:   VITAL SIGNS: Blood pressure (!) 198/66, pulse 68, temperature 97.9 F (36.6 C), temperature source Oral, resp. rate (!) 26, height 4\' 10"  (1.473 m), weight 49.9 kg (110 lb), SpO2 100 %.  GENERAL:  84 y.o.-year-old patient lying in the bed with no acute distress.  Frail appearing EYES: Pupils equal, round, reactive to light and accommodation. No scleral icterus. Extraocular muscles intact.  HEENT: Head atraumatic, normocephalic. Oropharynx and nasopharynx clear.  NECK:  Supple, no jugular venous distention. No thyroid enlargement, no tenderness.  LUNGS: Normal breath sounds bilaterally, no wheezing, rales,rhonchi or crepitation. No use of accessory muscles of respiration.  CARDIOVASCULAR: S1, S2 normal. No murmurs, rubs, or gallops.  ABDOMEN: Soft, nontender, nondistended. Bowel sounds present. No organomegaly or mass.  EXTREMITIES: No pedal edema, cyanosis, or clubbing.  NEUROLOGIC: Cranial nerves II through XII are intact. MAES. Gait not checked.   PSYCHIATRIC: The patient is alert and oriented x 3. + Confusion, slow mentation SKIN: No obvious rash, lesion, or ulcer.   LABORATORY PANEL:   CBC Recent Labs  Lab 05/31/18 1516  WBC 7.0  HGB 11.1*  HCT 30.3*  PLT 313  MCV 88.7  MCH 32.3  MCHC 36.5*  RDW 13.5  LYMPHSABS 1.3  MONOABS 0.6  EOSABS 0.0  BASOSABS 0.0   ------------------------------------------------------------------------------------------------------------------  Chemistries  Recent Labs  Lab 05/31/18 1516  NA 117*  K 3.4*  CL 81*  CO2 25  GLUCOSE 101*  BUN 12  CREATININE 0.59  CALCIUM 8.4*   ------------------------------------------------------------------------------------------------------------------ estimated creatinine clearance is 38.1 mL/min (by C-G formula based on SCr of 0.59 mg/dL). ------------------------------------------------------------------------------------------------------------------ No results for input(s): TSH, T4TOTAL, T3FREE, THYROIDAB in the last 72 hours.  Invalid input(s): FREET3   Coagulation profile No results for input(s): INR, PROTIME in the last 168 hours. ------------------------------------------------------------------------------------------------------------------- No results for input(s): DDIMER in the last 72 hours. -------------------------------------------------------------------------------------------------------------------  Cardiac Enzymes No results for input(s): CKMB, TROPONINI,  MYOGLOBIN in the last 168 hours.  Invalid input(s): CK ------------------------------------------------------------------------------------------------------------------ Invalid input(s): POCBNP  ---------------------------------------------------------------------------------------------------------------  Urinalysis    Component Value Date/Time   COLORURINE YELLOW (A) 05/31/2018 1516   APPEARANCEUR CLEAR (A) 05/31/2018 1516   APPEARANCEUR Clear 08/13/2014  1230   LABSPEC 1.012 05/31/2018 1516   LABSPEC 1.004 08/13/2014 1230   PHURINE 7.0 05/31/2018 1516   GLUCOSEU NEGATIVE 05/31/2018 1516   GLUCOSEU Negative 08/13/2014 1230   HGBUR SMALL (A) 05/31/2018 1516   BILIRUBINUR NEGATIVE 05/31/2018 1516   BILIRUBINUR Negative 08/13/2014 1230   KETONESUR NEGATIVE 05/31/2018 1516   PROTEINUR 30 (A) 05/31/2018 1516   NITRITE NEGATIVE 05/31/2018 1516   LEUKOCYTESUR NEGATIVE 05/31/2018 1516   LEUKOCYTESUR Negative 08/13/2014 1230     RADIOLOGY: Ct Head Wo Contrast  Result Date: 05/31/2018 CLINICAL DATA:  Altered mental status with generalized weakness. History of lung cancer. EXAM: CT HEAD WITHOUT CONTRAST TECHNIQUE: Contiguous axial images were obtained from the base of the skull through the vertex without intravenous contrast. COMPARISON:  08/13/2014 FINDINGS: Brain: Ventricles, cisterns and other CSF spaces are within normal. There is mild chronic ischemic microvascular disease and minimal age related atrophic change. Small old lacune infarct over the right lentiform nucleus. Vascular: No hyperdense vessel or unexpected calcification. Skull: Normal. Negative for fracture or focal lesion. Sinuses/Orbits: No acute finding. Other: None. IMPRESSION: No acute findings. Mild chronic ischemic microvascular disease and age related atrophic change. 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 Electronically Signed   By: Marin Olp M.D.   On: 05/31/2018 16:37    EKG: Orders placed or performed during the hospital encounter of 05/31/18  . ED EKG  . ED EKG  . EKG 12-Lead  . EKG 12-Lead    IMPRESSION AND PLAN: Bethany Lambert a84 y.o.femalewith a known history of COPD on 2 L home oxygen, history of stage III squamous cell lung cancer s/p chemo/radiation, GERD whom acute confusion presents to hospital for acute confusion/low sodium noted by outpatient provider   *Acute on chronic recurrent severe hyponatremia syndrome with associated encephalopathy  most  likely secondary to underlying lung cancer Admit to regular nursing for bed, neurochecks per routine, check ammonia level, RPR, MRI of the brain to rule out possible stroke, aspiration/fall precautions, IV fluids for rehydration, salt tabs with meals, BMP in the morning, continue close medical monitoring  *Acute uncontrolled hypertension Continue home regiment, hydralazine as needed systolic blood pressure greater than 160, vitals per routine, make changes as per necessary  *Acute hypochloremia, hypokalemia Replete with IV fluids, p.o. potassium, check magnesium/BMP in the morning  *Chronic hypoxic respiratory failure Stable Continue supplemental oxygen as patient is on 3 L continuous at home  *COPD without exacerbation Stable Continue current regiment  *Acute on chronic Generalizedweakness Most likely secondary to chronic deconditioning and COPD Physical therapy to see while in house  *History of Squamous cell lung cancer S/P chemo/radiation Need to follow-up with oncology status post discharge for continued management/care  Disposition Home in 1 to 3 days barring any complications  All the records are reviewed and case discussed with ED provider. Management plans discussed with the patient, family and they are in agreement.  CODE STATUS:dnr Code Status History    Date Active Date Inactive Code Status Order ID Comments User Context   05/18/2018 2344 05/20/2018 1554 DNR [id number]  Gladstone Lighter, MD Inpatient   05/18/2018 2032 05/18/2018 2344 Full Code [id number]  Gladstone Lighter, MD Inpatient   09/09/2016 2317 09/13/2016 1654 Full Code [id number]  Lance Coon, MD Inpatient  Questions for Most Recent Historical Code Status (Order [id number])    Question Answer Comment   In the event of cardiac or respiratory ARREST Do not call a "code blue"    In the event of cardiac or respiratory ARREST Do not perform Intubation, CPR, defibrillation or ACLS    In the event of cardiac or  respiratory ARREST Use medication by any route, position, wound care, and other measures to relive pain and suffering. May use oxygen, suction and manual treatment of airway obstruction as needed for comfort.    Comments confirmed with patient        TOTAL TIME TAKING CARE OF THIS PATIENT: 45 minutes.    Avel Peace Salary M.D on 05/31/2018   Between 7am to 6pm - Pager - [phone]  After 6pm go to [email or web address] - password [email or web address] [facility]  Office  [phone]  CC: Primary care physician; Baxter Hire, MD   Note: This dictation was prepared with Dragon dictation along with smaller phrase technology. Any transcriptional errors that result from this process are unintentional.

## 2018-05-31 NOTE — ED Provider Notes (Signed)
[facility] Emergency Department Provider Note    First MD Initiated Contact with Patient 05/31/18 1501     (approximate)  I have reviewed the triage vital signs and the nursing notes.   HISTORY  Chief Complaint Altered Mental Status    HPI Bethany Lambert is a 84 y.o. female history of COPD on 2 to 3 L of nasal cannula at home presents the ER status post 2 rounds of antibiotics treatment for pneumonia with worsening confusion and altered mental status.  States she is also feeling generalized weakness and inability to ambulate.  This is progressively worsened over the past 10 days.  Complete her last dose of antibiotics today.  Went to see her PCP for worsening weakness and based on her presentation I was directed the ER.  Blood work did show evidence of acute hyponatremia down to 119 which is clearly new for the patient.  She denies any pain at this time.  No numbness or tingling.  No falls.  No lateralizing weakness.    Past Medical History:  Diagnosis Date  . COPD (chronic obstructive pulmonary disease) (HCC)    on 2L home o2  . Erythrocytosis   . GERD (gastroesophageal reflux disease)   . History of chemotherapy   . History of radiation therapy   . Hyperlipidemia   . Hypertension   . Kidney failure   . Lung cancer ([place])    squamous, stage 3 non small cell lung ca  . Polycythemia   . Psoriasis    Family History  Problem Relation Age of Onset  . Heart attack Father   . Colon cancer Father   . Stroke Mother   . Cancer Son    Past Surgical History:  Procedure Laterality Date  . CATARACT EXTRACTION    . COLON RESECTION    . CT GUIDED BIOPSY  ([facility] HX)  11/19/2012   lung  . HERNIA REPAIR    . TOTAL ABDOMINAL HYSTERECTOMY     Patient Active Problem List   Diagnosis Date Noted  . Pneumonia 05/18/2018  . Pancreatic cyst 07/10/2017  . COPD exacerbation ([place]) 09/09/2016  . GERD (gastroesophageal reflux disease) 09/09/2016  . HTN  (hypertension) 09/09/2016  . Cancer of upper lobe of left lung ([place]) 07/06/2016  . Tachycardia 02/03/2013  . Dyspnea 02/03/2013      Prior to Admission medications   Medication Sig Start Date End Date Taking? Authorizing Provider  albuterol (PROVENTIL HFA;VENTOLIN HFA) 108 (90 Base) MCG/ACT inhaler Inhale 2 puffs into the lungs every 6 (six) hours as needed for wheezing. 03/27/18  Yes Lavera Guise, MD  Cyanocobalamin (VITAMIN B-12 IJ) Inject 1,000 mcg as directed every 30 (thirty) days.    Yes [provider]  diltiazem (DILACOR XR) 120 MG 24 hr capsule Take 120 mg by mouth daily.   Yes [provider]  docusate sodium (COLACE) 100 MG capsule Take 100-200 mg by mouth 2 (two) times daily. 100 mg in the morning and 200 mg at bedtime   Yes [provider]  fluocinonide (LIDEX) 0.05 % external solution Apply 1 application topically 2 (two) times daily.  08/18/16  Yes [provider]  ipratropium-albuterol (DUONEB) 0.5-2.5 (3) MG/3ML SOLN Take 3 mLs by nebulization every 6 (six) hours as needed (wheezing).    Yes [provider]  metoprolol (LOPRESSOR) 50 MG tablet Take 1 tablet (50 mg total) by mouth 2 (two) times daily. 01/31/13  Yes Wellington Hampshire, MD  omeprazole (  PRILOSEC) 20 MG capsule Take 20 mg by mouth 2 (two) times daily before a meal.    Yes [provider]  OXYGEN Inhale 3 L into the lungs continuous.    Yes [provider]  potassium chloride (K-DUR,KLOR-CON) 10 MEQ tablet Take 10 mEq by mouth daily.   Yes [provider]  SYMBICORT 80-4.5 MCG/ACT inhaler USE TWO PUFFS TWO TIMES A DAY 03/15/18  Yes Boscia, Heather E, NP  tiotropium (SPIRIVA) 18 MCG inhalation capsule Place 18 mcg into inhaler and inhale daily.   Yes [provider]    Allergies Ciprofloxacin; Fluocinolone; and Norvasc [amlodipine besylate]    Social History Social History   Tobacco Use  . Smoking status: Former Smoker     Packs/day: 2.00    Years: 40.00    Pack years: 80.00    Types: Cigarettes  . Smokeless tobacco: Never Used  Substance Use Topics  . Alcohol use: No    Alcohol/week: 0.0 oz  . Drug use: No    Review of Systems Patient denies headaches, rhinorrhea, blurry vision, numbness, shortness of breath, chest pain, edema, cough, abdominal pain, nausea, vomiting, diarrhea, dysuria, fevers, rashes or hallucinations unless otherwise stated above in HPI. ____________________________________________   PHYSICAL EXAM:  VITAL SIGNS: Vitals:   05/31/18 1600 05/31/18 1631  BP: (!) 184/79 (!) 198/66  Pulse:    Resp: (!) 26   Temp:    SpO2:      Constitutional: Alert and oriented to person and place, disoriented to year  Eyes: Conjunctivae are normal.  Head: Atraumatic. Nose: No congestion/rhinnorhea. Mouth/Throat: Mucous membranes are moist.   Neck: No stridor. Painless ROM.  Cardiovascular: Normal rate, regular rhythm. Grossly normal heart sounds.  Good peripheral circulation. Respiratory: mild tachypnea.  No retractions. Lungs with coarse bilateral breathsounds Gastrointestinal: Soft and nontender. No distention. No abdominal bruits. No CVA tenderness. Genitourinary:  Musculoskeletal: No lower extremity tenderness with 1+ BLE edema.  No joint effusions. Neurologic:  Normal speech and language. No gross focal neurologic deficits are appreciated. No facial droop Skin:  Skin is warm, dry and intact. No rash noted. Psychiatric: Mood and affect are normal. Speech and behavior are normal.  ____________________________________________   LABS (all labs ordered are listed, but only abnormal results are displayed)  Results for orders placed or performed during the hospital encounter of 05/31/18 (from the past 24 hour(s))  CBC with Differential/Platelet     Status: Abnormal   Collection Time: 05/31/18  3:16 PM  Result Value Ref Range   WBC 7.0 3.6 - 11.0 K/uL   RBC 3.42 (L) 3.80 - 5.20 MIL/uL    Hemoglobin 11.1 (L) 12.0 - 16.0 g/dL   HCT 30.3 (L) 35.0 - 47.0 %   MCV 88.7 80.0 - 100.0 fL   MCH 32.3 26.0 - 34.0 pg   MCHC 36.5 (H) 32.0 - 36.0 g/dL   RDW 13.5 11.5 - 14.5 %   Platelets 313 150 - 440 K/uL   Neutrophils Relative % 73 %   Neutro Abs 5.1 1.4 - 6.5 K/uL   Lymphocytes Relative 18 %   Lymphs Abs 1.3 1.0 - 3.6 K/uL   Monocytes Relative 8 %   Monocytes Absolute 0.6 0.2 - 0.9 K/uL   Eosinophils Relative 0 %   Eosinophils Absolute 0.0 0 - 0.7 K/uL   Basophils Relative 1 %   Basophils Absolute 0.0 0 - 0.1 K/uL  Basic metabolic panel     Status: Abnormal   Collection Time: 05/31/18  3:16 PM  Result Value Ref Range   Sodium 117 (LL) 135 - 145 mmol/L   Potassium 3.4 (L) 3.5 - 5.1 mmol/L   Chloride 81 (L) 98 - 111 mmol/L   CO2 25 22 - 32 mmol/L   Glucose, Bld 101 (H) 70 - 99 mg/dL   BUN 12 8 - 23 mg/dL   Creatinine, Ser 0.59 0.44 - 1.00 mg/dL   Calcium 8.4 (L) 8.9 - 10.3 mg/dL   GFR calc non Af Amer >60 >60 mL/min   GFR calc Af Amer >60 >60 mL/min   Anion gap 11 5 - 15  Urinalysis, Complete w Microscopic     Status: Abnormal   Collection Time: 05/31/18  3:16 PM  Result Value Ref Range   Color, Urine YELLOW (A) YELLOW   APPearance CLEAR (A) CLEAR   Specific Gravity, Urine 1.012 1.005 - 1.030   pH 7.0 5.0 - 8.0   Glucose, UA NEGATIVE NEGATIVE mg/dL   Hgb urine dipstick SMALL (A) NEGATIVE   Bilirubin Urine NEGATIVE NEGATIVE   Ketones, ur NEGATIVE NEGATIVE mg/dL   Protein, ur 30 (A) NEGATIVE mg/dL   Nitrite NEGATIVE NEGATIVE   Leukocytes, UA NEGATIVE NEGATIVE   RBC / HPF 0-5 0 - 5 RBC/hpf   WBC, UA 0-5 0 - 5 WBC/hpf   Bacteria, UA RARE (A) NONE SEEN   Squamous Epithelial / LPF 0-5 0 - 5   Mucus PRESENT    ____________________________________________  EKG My review and personal interpretation at Time: 16:29   Indication: weakness  Rate: 70  Rhythm: sinus Axis: normal Other: normal intervals, no  stemi ____________________________________________  RADIOLOGY  I personally reviewed all radiographic images ordered to evaluate for the above acute complaints and reviewed radiology reports and findings.  These findings were personally discussed with the patient.  Please see medical record for radiology report.  ____________________________________________   PROCEDURES  Procedure(s) performed:  .Critical Care Performed by: Merlyn Lot, MD Authorized by: Merlyn Lot, MD   Critical care provider statement:    Critical care time (minutes):  30   Critical care time was exclusive of:  Separately billable procedures and treating other patients   Critical care was necessary to treat or prevent imminent or life-threatening deterioration of the following conditions:  Metabolic crisis   Critical care was time spent personally by me on the following activities:  Development of treatment plan with patient or surrogate, discussions with consultants, evaluation of patient's response to treatment, examination of patient, obtaining history from patient or surrogate, ordering and performing treatments and interventions, ordering and review of laboratory studies, ordering and review of radiographic studies, pulse oximetry, re-evaluation of patient's condition and review of old charts      Critical Care performed: yes  ____________________________________________   INITIAL IMPRESSION / ASSESSMENT AND PLAN / ED COURSE  Pertinent labs & imaging results that were available during my care of the patient were reviewed by me and considered in my medical decision making (see chart for details).   DDX: Dehydration, sepsis, pna, uti, hypoglycemia, cva, drug effect, withdrawal, encephalitis   Bethany Lambert is a 84 y.o. who presents to the ED with [place] as described above.  Patient with evidence of acute hyponatremia which she will receive IV fluids.  Suspect component of medication  application as well as dehydration and poor p.o. intake.  CT head shows no evidence of mass.  Neuro exam is nonfocal but does show generalized encephalopathy.  Will require admission for further medical management.  Have discussed with the patient and available family all diagnostics and treatments performed thus far and all questions were answered to the best of my ability. The patient demonstrates understanding and agreement with plan.       As part of my medical decision making, I reviewed the following data within the [place] notes reviewed and incorporated, Labs reviewed, notes from prior ED visits.   ____________________________________________   FINAL CLINICAL IMPRESSION(S) / ED DIAGNOSES  Final diagnoses:  Hyponatremia  Altered mental status, unspecified altered mental status type      NEW MEDICATIONS STARTED DURING THIS VISIT:  New Prescriptions   No medications on file     Note:  This document was prepared using Dragon voice recognition software and may include unintentional dictation errors.    Merlyn Lot, MD 05/31/18 [phone]

## 2018-05-31 NOTE — Progress Notes (Signed)
Family Meeting Note  Advance Directive:yes  Today a meeting took place with the Patient, daughter  Patient is able to participate  The following clinical team members were present during this meeting:MD  The following were discussed:Patient's diagnosis: Respiratory failure-chronic, COPD, polycythemia history, chronic deconditioning, lung cancer, Patient's progosis: Unable to determine and Goals for treatment: DNR  Additional follow-up to be provided: prn  Time spent during discussion:20 minutes  Gorden Harms, MD

## 2018-05-31 NOTE — ED Notes (Signed)
Pt up to bathroom at this time, pt ambulatory with assistance

## 2018-06-01 ENCOUNTER — Inpatient Hospital Stay: Payer: Medicare Other

## 2018-06-01 LAB — BASIC METABOLIC PANEL
Anion gap: 7 (ref 5–15)
BUN: 8 mg/dL (ref 8–23)
CALCIUM: 8.3 mg/dL — AB (ref 8.9–10.3)
CO2: 26 mmol/L (ref 22–32)
CREATININE: 0.55 mg/dL (ref 0.44–1.00)
Chloride: 92 mmol/L — ABNORMAL LOW (ref 98–111)
Glucose, Bld: 91 mg/dL (ref 70–99)
Potassium: 3.6 mmol/L (ref 3.5–5.1)
SODIUM: 125 mmol/L — AB (ref 135–145)

## 2018-06-01 LAB — PREALBUMIN: Prealbumin: 20 mg/dL (ref 18–38)

## 2018-06-01 LAB — RPR: RPR Ser Ql: NONREACTIVE

## 2018-06-01 LAB — VITAMIN B12: VITAMIN B 12: 1372 pg/mL — AB (ref 180–914)

## 2018-06-01 LAB — TSH: TSH: 0.808 u[IU]/mL (ref 0.350–4.500)

## 2018-06-01 MED ORDER — TRAZODONE HCL 50 MG PO TABS
50.0000 mg | ORAL_TABLET | Freq: Every day | ORAL | Status: DC
  Administered 2018-06-01 – 2018-06-03 (×3): 50 mg via ORAL
  Filled 2018-06-01 (×3): qty 1

## 2018-06-01 MED ORDER — ENSURE ENLIVE PO LIQD
237.0000 mL | Freq: Two times a day (BID) | ORAL | Status: DC
  Administered 2018-06-01 – 2018-06-04 (×6): 237 mL via ORAL

## 2018-06-01 MED ORDER — ORAL CARE MOUTH RINSE
15.0000 mL | Freq: Two times a day (BID) | OROMUCOSAL | Status: DC
  Administered 2018-06-02 – 2018-06-04 (×3): 15 mL via OROMUCOSAL

## 2018-06-01 MED ORDER — LORATADINE 10 MG PO TABS
10.0000 mg | ORAL_TABLET | Freq: Every day | ORAL | Status: DC
  Administered 2018-06-01 – 2018-06-04 (×4): 10 mg via ORAL
  Filled 2018-06-01 (×4): qty 1

## 2018-06-01 MED ORDER — VITAMIN C 500 MG PO TABS
250.0000 mg | ORAL_TABLET | Freq: Two times a day (BID) | ORAL | Status: DC
  Administered 2018-06-01 – 2018-06-04 (×7): 250 mg via ORAL
  Filled 2018-06-01 (×8): qty 0.5

## 2018-06-01 NOTE — Progress Notes (Signed)
Patient presently resting in the bed at this time, alert, denies nay pain at his time, vss, dyspnea on exertion, assisted to [facility] as needed, will continue to monitor

## 2018-06-01 NOTE — Care Management Note (Signed)
Case Management Note  Patient Details  Name: Bethany Lambert MRN: [id number] Date of Birth: 07/17/1934  Subjective/Objective:  Admitted to [facility] with the diagnosis of hyponatremia.Discharged from this facility 05/20/18.  Lives with son, [name] [phone]). Sees Dr. Harrel Lemon as primary care physician.  Prescriptions are filled at [facility]. Currently [facility] is seeing [name]. No skilled facility. Home oxygen per [facility]. Uses 2 liters per nasal cannula continuous. No other medical equipment in the home. Takes care of all basic activities of daily living herself. Family helps with errands. History of Lung and colon cancer. History of chemotherapy. MR revealled small  Lacunar infact Family will transport                Action/Plan: Currently with [name]. Will need resumption of services when discharged   Expected Discharge Date:  06/02/18               Expected Discharge Plan:     In-House Referral:     Discharge planning Services     Post Acute Care Choice:    Choice offered to:     DME Arranged:    DME Agency:     HH Arranged:    HH Agency:     Status of Service:     If discussed at [employer], dates discussed:    Additional Comments:  Shelbie Ammons, RN MSN CCM Care Management [phone] 06/01/2018, 8:17 AM

## 2018-06-01 NOTE — Progress Notes (Signed)
Patient ID: Bethany Lambert, female   DOB: 28-Sep-1934, 84 y.o.   MRN: [id number]  [employer] PROGRESS NOTE  Bethany Lambert MVH:[id number] DOB: 06-06-1934 DOA: 05/31/2018 PCP: Baxter Hire, MD  HPI/Subjective: Patient feeling weak, dizzy and wore out.  She is not eating like she should.  No appetite.  She has a history of lung cancer and has recently had shingles.  She is on chronic oxygen.  Objective: Vitals:   06/01/18 1013 06/01/18 1404  BP: (!) 131/41 (!) 168/73  Pulse: 77 67  Resp: 20 20  Temp: 98.2 F (36.8 C) 98.3 F (36.8 C)  SpO2: 100% 100%    Filed Weights   05/31/18 1347 06/01/18 1239  Weight: 49.9 kg (110 lb) 51.3 kg (113 lb 1.5 oz)    ROS: Review of Systems  Constitutional: Negative for chills and fever.  Eyes: Negative for blurred vision.  Respiratory: Negative for cough and shortness of breath.   Cardiovascular: Negative for chest pain.  Gastrointestinal: Negative for abdominal pain, constipation, diarrhea, nausea and vomiting.  Genitourinary: Negative for dysuria.  Musculoskeletal: Negative for joint pain.  Neurological: Positive for dizziness. Negative for headaches.   Exam: Physical Exam  HENT:  Nose: No mucosal edema.  Mouth/Throat: No oropharyngeal exudate or posterior oropharyngeal edema.  Bilateral tympanic membrane bulging.  No erythema.  Eyes: Pupils are equal, round, and reactive to light. Conjunctivae, EOM and lids are normal.  Neck: No JVD present. Carotid bruit is not present. No edema present. No thyroid mass and no thyromegaly present.  Cardiovascular: S1 normal and S2 normal. Exam reveals no gallop.  No murmur heard. Pulses:      Dorsalis pedis pulses are 2+ on the right side, and 2+ on the left side.  Respiratory: No respiratory distress. She has no wheezes. She has no rhonchi. She has no rales.  GI: Soft. Bowel sounds are normal. There is no tenderness.  Musculoskeletal:       Right ankle: She exhibits no swelling.     Left ankle: She exhibits no swelling.  Lymphadenopathy:    She has no cervical adenopathy.  Neurological: She is alert. No cranial nerve deficit.  Skin: Skin is warm. No rash noted. Nails show no clubbing.  Psychiatric: She has a normal mood and affect.      Data Reviewed: Basic Metabolic Panel: Recent Labs  Lab 05/31/18 1516 06/01/18 0529  NA 117* 125*  K 3.4* 3.6  CL 81* 92*  CO2 25 26  GLUCOSE 101* 91  BUN 12 8  CREATININE 0.59 0.55  CALCIUM 8.4* 8.3*    Recent Labs  Lab 05/31/18 1750  AMMONIA <9*   CBC: Recent Labs  Lab 05/31/18 1516  WBC 7.0  NEUTROABS 5.1  HGB 11.1*  HCT 30.3*  MCV 88.7  PLT 313     Studies: Dg Chest 2 View  Result Date: 06/01/2018 CLINICAL DATA:  Low sodium.  History of COPD. EXAM: CHEST - 2 VIEW COMPARISON:  05/18/2018 FINDINGS: Heart size is normal. There is chronic aortic atherosclerosis. The right lung is clear except for suprahilar scarring. Left lung shows asymmetrically more pronounced suprahilar scarring. Some superimposed patchy infiltrate or atelectasis in that region is probably improved. No worsening or new findings. No significant bone abnormality. IMPRESSION: Radiographic improvement with less atelectasis/infiltrate in the left upper lobe. Suprahilar scarring bilaterally as seen previously, left more than right. Electronically Signed   By: Nelson Chimes M.D.   On: 06/01/2018 13:55   Ct Head Wo  Contrast  Result Date: 05/31/2018 CLINICAL DATA:  Altered mental status with generalized weakness. History of lung cancer. EXAM: CT HEAD WITHOUT CONTRAST TECHNIQUE: Contiguous axial images were obtained from the base of the skull through the vertex without intravenous contrast. COMPARISON:  08/13/2014 FINDINGS: Brain: Ventricles, cisterns and other CSF spaces are within normal. There is mild chronic ischemic microvascular disease and minimal age related atrophic change. Small old lacune infarct over the right lentiform nucleus. Vascular:  No hyperdense vessel or unexpected calcification. Skull: Normal. Negative for fracture or focal lesion. Sinuses/Orbits: No acute finding. Other: None. IMPRESSION: No acute findings. Mild chronic ischemic microvascular disease and age related atrophic change. 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 Electronically Signed   By: Marin Olp M.D.   On: 05/31/2018 16:37   Mr Brain Wo Contrast  Result Date: 06/01/2018 CLINICAL DATA:  84 y/o F; acute altered mental status and increased confusion. EXAM: MRI HEAD WITHOUT CONTRAST TECHNIQUE: Multiplanar, multiecho pulse sequences of the brain and surrounding structures were obtained without intravenous contrast. COMPARISON:  05/31/2018 CT head. FINDINGS: Brain: No acute infarction, hemorrhage, hydrocephalus, extra-axial collection or mass lesion. Small chronic lacunar infarct within the right putamen. Several nonspecific foci of T2 FLAIR hyperintense signal abnormality in subcortical and periventricular white matter are compatible with mild chronic microvascular ischemic changes for age. Mild brain parenchymal volume loss. Vascular: Normal flow voids. Skull and upper cervical spine: Normal marrow signal. Sinuses/Orbits: Negative. Other: Bilateral intra-ocular lens replacement. IMPRESSION: 1. No acute intracranial abnormality identified. 2. Mild for age chronic microvascular ischemic changes and parenchymal volume loss of the brain. 3. Small chronic lacunar infarct in the right putamen. Electronically Signed   By: Kristine Garbe M.D.   On: 06/01/2018 01:04    Scheduled Meds: . diltiazem  120 mg Oral Q24H  . docusate sodium  100-200 mg Oral BID  . enoxaparin (LOVENOX) injection  40 mg Subcutaneous Q24H  . feeding supplement (ENSURE ENLIVE)  237 mL Oral BID BM  . loratadine  10 mg Oral Daily  . mouth rinse  15 mL Mouth Rinse BID  . metoprolol tartrate  50 mg Oral BID  . mometasone-formoterol  2 puff Inhalation BID  . multivitamin with minerals  1 tablet Oral  Daily  . pantoprazole  40 mg Oral Daily  . potassium chloride  10 mEq Oral Daily  . sodium chloride  2 g Oral TID WC  . tiotropium  18 mcg Inhalation Daily  . traZODone  50 mg Oral QHS  . vitamin C  250 mg Oral BID   Continuous Infusions: . 0.9 % NaCl with KCl 20 mEq / L 50 mL/hr at 06/01/18 1214    Assessment/Plan:  1. Acute hyponatremia likely with antibiotic use and not eating very well.  Gentle IV fluid hydration.  Start Ensure.  Less restrictions with diet.  Monitor closely.  Physical therapy evaluation. 2. History of squamous cell lung cancer follows up with Dr. Rogue Bussing as outpatient. 3. Essential hypertension on Cardizem and metoprolol 4. Chronic respiratory failure with end-stage COPD on chronic oxygen 2 L. 5. GERD on PPI 6. Pressure seen in the Ears start Claritin 7. Memory loss.  May have an underlying dementia.  Send off TSH.  Code Status:     Code Status Orders  (From admission, onward)        Start     Ordered   05/31/18 1839  Do not attempt resuscitation (DNR)  Continuous    Question Answer Comment  In the event of  cardiac or respiratory ARREST Do not call a "code blue"   In the event of cardiac or respiratory ARREST Do not perform Intubation, CPR, defibrillation or ACLS   In the event of cardiac or respiratory ARREST Use medication by any route, position, wound care, and other measures to relive pain and suffering. May use oxygen, suction and manual treatment of airway obstruction as needed for comfort.   Comments confirmed with patient, nurse may pronounce      05/31/18 1838    Code Status History    Date Active Date Inactive Code Status Order ID Comments User Context   05/18/2018 2344 05/20/2018 1554 DNR [id number]  Gladstone Lighter, MD Inpatient   05/18/2018 2032 05/18/2018 2344 Full Code [id number]  Gladstone Lighter, MD Inpatient   09/09/2016 2317 09/13/2016 1654 Full Code [id number]  Lance Coon, MD Inpatient     Family Communication: Spoke with  daughter on the phone Disposition Plan: Once sodium comes up and able to walk around may be able to go home  Time spent: 28 minutes  [place]

## 2018-06-01 NOTE — Progress Notes (Deleted)
Received Md order to discharge patient to home, reviewed home meds, prescriptions, follow up appointments and discharge instructions with patient and patient verbalized understanding

## 2018-06-01 NOTE — Progress Notes (Signed)
Initial Nutrition Assessment  DOCUMENTATION CODES:   Not applicable  INTERVENTION:   Ensure Enlive po BID, each supplement provides 350 kcal and 20 grams of protein  MVI daily  Vitamin C '250mg'$  BID  Dysphagia 3 diet   NUTRITION DIAGNOSIS:   Inadequate oral intake related to acute illness as evidenced by per patient/family report.  GOAL:   Patient will meet greater than or equal to 90% of their needs  MONITOR:   PO intake, Supplement acceptance, Labs, Weight trends, Skin, I & O's  REASON FOR ASSESSMENT:   Consult Assessment of nutrition requirement/status  ASSESSMENT:   84 y.o. female history of COPD on 2 to 3 L of nasal cannula at home presents the ER status post 2 rounds of antibiotics treatment for pneumonia with worsening confusion and altered mental status.     Met with pt in room today. Pt reports good appetite and oral intake pta and today. Pt reports eating 70% of her breakfast this morning. Pt reports that she has always been a good eater except when she was going through her lung cancer treatments. Pt reports that she does like Ensure but she doesn't drink it regularly. Pt reports that he front teeth are cracking and falling out so it makes it hard for her to bite into certain foods. Pt requesting a dysphagia 3 diet. Pt also noted to have ecchymosis and reports that she doesn't eat much fruit or vegetables. Recommend vitamin C supplementation as pt at risk for deficiency. Per chart, pt appears weight stable since February. RD will order supplements and vitamins.   Medications reviewed and include: colace, lovenox, KCl, NaCl with KCl '@50ml'$ /hr  Labs reviewed: Na 125(L), Cl 92(L)  NUTRITION - FOCUSED PHYSICAL EXAM:    Most Recent Value  Orbital Region  No depletion  Upper Arm Region  No depletion  Thoracic and Lumbar Region  No depletion  Buccal Region  No depletion  Temple Region  No depletion  Clavicle Bone Region  Mild depletion  Clavicle and Acromion Bone  Region  Mild depletion  Scapular Bone Region  No depletion  Dorsal Hand  Moderate depletion  Patellar Region  Mild depletion  Anterior Thigh Region  No depletion  Posterior Calf Region  No depletion  Edema (RD Assessment)  Mild  Hair  Reviewed  Eyes  Reviewed  Mouth  Reviewed  Skin  Reviewed  Nails  Reviewed     Diet Order:   Diet Order           DIET DYS 3 Room service appropriate? Yes; Fluid consistency: Thin  Diet effective now         EDUCATION NEEDS:   Education needs have been addressed  Skin:  Skin Assessment: Reviewed RN Assessment(ecchymosis )  Last BM:  6/27- type 5  Height:   Ht Readings from Last 1 Encounters:  05/31/18 '4\' 10"'$  (1.473 m)    Weight:   Wt Readings from Last 1 Encounters:  06/01/18 113 lb 1.5 oz (51.3 kg)    Ideal Body Weight:  44 kg  BMI:  Body mass index is 23.64 kg/m.  Estimated Nutritional Needs:   Kcal:  1200-1400kcal/day   Protein:  62-72g/day   Fluid:  >1.3L/day   Koleen Distance MS, RD, LDN Pager #- [phone] Office#- [phone] After Hours Pager: [phone]

## 2018-06-01 NOTE — Plan of Care (Signed)
  Problem: Clinical Measurements: Goal: Respiratory complications will improve Outcome: Progressing   Problem: Clinical Measurements: Goal: Will remain free from infection Outcome: Progressing

## 2018-06-02 LAB — URINE DRUGS OF ABUSE SCREEN W ALC, ROUTINE (REF LAB)
Amphetamines, Urine: NEGATIVE ng/mL
Barbiturate, Ur: NEGATIVE ng/mL
Benzodiazepine Quant, Ur: NEGATIVE ng/mL
CANNABINOID QUANT UR: NEGATIVE ng/mL
COCAINE (METAB.): NEGATIVE ng/mL
Ethanol U, Quan: NEGATIVE %
METHADONE SCREEN, URINE: NEGATIVE ng/mL
Opiate Quant, Ur: NEGATIVE ng/mL
PHENCYCLIDINE, UR: NEGATIVE ng/mL
Propoxyphene, Urine: NEGATIVE ng/mL

## 2018-06-02 LAB — BASIC METABOLIC PANEL
Anion gap: 6 (ref 5–15)
BUN: 10 mg/dL (ref 8–23)
CHLORIDE: 101 mmol/L (ref 98–111)
CO2: 24 mmol/L (ref 22–32)
CREATININE: 0.7 mg/dL (ref 0.44–1.00)
Calcium: 8.5 mg/dL — ABNORMAL LOW (ref 8.9–10.3)
GFR calc non Af Amer: >60 mL/min (ref >60)
Glucose, Bld: 92 mg/dL (ref 70–99)
POTASSIUM: 3.5 mmol/L (ref 3.5–5.1)
SODIUM: 131 mmol/L — AB (ref 135–145)

## 2018-06-02 LAB — OSMOLALITY: Osmolality: 280 mOsm/kg (ref 275–295)

## 2018-06-02 NOTE — Care Management Note (Signed)
Case Management Note  Patient Details  Name: DERHONDA EASTLICK MRN: [id number] Date of Birth: 1934-10-20  Subjective/Objective:     RNCM received consult for PT recommendations of Home health services. Spoke with patient who shares desire for as much help as possible. She currently lives alone but her children are there frequently to assist her. Helene Kelp [phone] is her primary contact person. I spoke with patient about preference of agency and since she [facility] she would prefer other services with them as well. However, patient prefer I speak with Helene Kelp first, I have left her a voicemail to confirm discharge planning. She has a walker available in the home.                Action/Plan:  Will confirm planing with daughter and initiate [place] set up once discharge is confirmed.  Expected Discharge Date:  06/02/18               Expected Discharge Plan:     In-House Referral:     Discharge planning Services     Post Acute Care Choice:    Choice offered to:     DME Arranged:    DME Agency:     HH Arranged:    HH Agency:     Status of Service:     If discussed at [employer], dates discussed:    Additional Comments:  Latanya Maudlin, RN 06/02/2018, 2:35 PM

## 2018-06-02 NOTE — Evaluation (Signed)
Physical Therapy Evaluation Patient Details Name: Bethany Lambert MRN: [id number] DOB: [birth year] Today's Date: 06/02/2018   History of Present Illness  Patient is an 84 year old female admitted for AMS and hyponatremia.  PMH includes lung CA, Htn, hyperlipidemia and COPD.  Clinical Impression  Pt is an 84 year old female who lives in a one story home with her son and granddaughter.  She is independent with mobility and uses a RW occasionally when needed.  Pt was able to perform bed mobility mod I and perform STS without physical assist.  Pt presented with mild strength decrease during UE and LE MMT.  PT provided close assist due to pt reporting experiencing dizziness, which she thinks is medication related as well as visual deficits.  Pt able to ambulate 30 ft in room with youth RW, SBA and control descent to chair.  Pt states that she is functioning close to baseline but that walking was somewhat more difficult.  PT discussed importance of use of RW and mindful movement during recovery process and pt was agreeable.   Pt will continue to benefit from skilled PT with focus on strength, functional mobility and use of AD.      Follow Up Recommendations Home health PT    Equipment Recommendations  None recommended by PT    Recommendations for Other Services       Precautions / Restrictions Precautions Precautions: Fall Restrictions Weight Bearing Restrictions: No      Mobility  Bed Mobility Overal bed mobility: Modified Independent             General bed mobility comments: Increased time  Transfers Overall transfer level: Needs assistance Equipment used: Rolling walker (2 wheeled) Transfers: Sit to/from Stand Sit to Stand: Supervision         General transfer comment: Close SBA and help with setup of RW due to pt reported visual deficit.  Pt is able to initiate and complete STS without physical assist.  Ambulation/Gait Ambulation/Gait assistance: Min guard Gait  Distance (Feet): 30 Feet Assistive device: Rolling walker (2 wheeled)     Gait velocity interpretation: <1.8 ft/sec, indicate of risk for recurrent falls General Gait Details: Symmetric gait pattern, moderate foot clearance with decreased knee flexion during swing phase, maintains flexed posture.  Pt able to navigate obstacles through tight spaces and take hands off of RW when standing if needed.  Stairs         General stair comments: slow progression with care  Wheelchair Mobility    Modified Rankin (Stroke Patients Only)       Balance Overall balance assessment: Needs assistance Sitting-balance support: Feet supported Sitting balance-Leahy Scale: Good     Standing balance support: Bilateral upper extremity supported Standing balance-Leahy Scale: Fair Standing balance comment: Able to stand without RW.  PT discussed use of RW for safety during recovery process esp. since pt reports dizziness.                             Pertinent Vitals/Pain Pain Assessment: No/denies pain    Home Living Family/patient expects to be discharged to:: Private residence Living Arrangements: Children(Son) Available Help at Discharge: Available PRN/intermittently(Son works 10 hrs/day.  Granddaughter is there also.) Type of Home: House Home Access: Stairs to enter Entrance Stairs-Rails: None(Hold the door knob- smaller steps.) Entrance Stairs-Number of Steps: 3 Home Layout: One level Home Equipment: Walker - 2 wheels;Shower seat      Prior Function  Level of Independence: Independent with assistive device(s)         Comments: Does laundry, cleans house, uses RW intermittently when feeling weak and dizzy (due to medication).     Hand Dominance        Extremity/Trunk Assessment   Upper Extremity Assessment Upper Extremity Assessment: Overall WFL for tasks assessed(Grossly 4-/5 bilaterally.)    Lower Extremity Assessment Lower Extremity Assessment: Overall WFL for  tasks assessed(Grossly 4-/5 bilaterally.)    Cervical / Trunk Assessment Cervical / Trunk Assessment: Kyphotic(Slightly kyphotic)  Communication   Communication: No difficulties  Cognition Arousal/Alertness: Awake/alert Behavior During Therapy: WFL for tasks assessed/performed Overall Cognitive Status: Within Functional Limits for tasks assessed                                 General Comments: Follows commands consistently.  Requires some redirection intermittently.      General Comments      Exercises     Assessment/Plan    PT Assessment Patient needs continued PT services  PT Problem List Decreased strength;Decreased mobility;Decreased balance;Decreased knowledge of use of DME;Decreased activity tolerance       PT Treatment Interventions Functional mobility training;Neuromuscular re-education;Stair training;Balance training;Gait training;Therapeutic exercise;DME instruction;Therapeutic activities;Patient/family education    PT Goals (Current goals can be found in the Care Plan section)  Acute Rehab PT Goals Patient Stated Goal: To return home and resume helping with household activity. PT Goal Formulation: With patient Time For Goal Achievement: 06/16/18 Potential to Achieve Goals: Good    Frequency Min 2X/week   Barriers to discharge        Co-evaluation               AM-PAC PT "6 Clicks" Daily Activity  Outcome Measure Difficulty turning over in bed (including adjusting bedclothes, sheets and blankets)?: A Little Difficulty moving from lying on back to sitting on the side of the bed? : A Little Difficulty sitting down on and standing up from a chair with arms (e.g., wheelchair, bedside commode, etc,.)?: A Little Help needed moving to and from a bed to chair (including a wheelchair)?: A Little Help needed walking in hospital room?: A Little Help needed climbing 3-5 steps with a railing? : A Little 6 Click Score: 18    End of Session  Equipment Utilized During Treatment: Gait belt;Oxygen Activity Tolerance: Patient tolerated treatment well Patient left: in chair;with chair alarm set;with call bell/phone within reach Nurse Communication: Mobility status PT Visit Diagnosis: Unsteadiness on feet (R26.81);Muscle weakness (generalized) (M62.81)    Time: 7616-0737 PT Time Calculation (min) (ACUTE ONLY): 32 min   Charges:   PT Evaluation $PT Eval Low Complexity: 1 Low PT Treatments $Therapeutic Activity: 8-22 mins   PT G Codes:   PT G-Codes **NOT FOR INPATIENT CLASS** Functional Assessment Tool Used: AM-PAC 6 Clicks Basic Mobility    Roxanne Gates, PT, DPT   Roxanne Gates 06/02/2018, 9:34 AM

## 2018-06-02 NOTE — Progress Notes (Signed)
Patient ID: Bethany Lambert, female   DOB: 11/17/1934, 84 y.o.   MRN: [id number]  [facility] PROGRESS NOTE  Bethany Lambert [id number]:[id number] DOB: [birth year] DOA: 05/31/2018 PCP: Bethany Hire, MD  HPI/Subjective: Patient feeling weak, dizzy and wore out.  She is not eating like she should.  No appetite.  She has a history of lung cancer and has recently had shingles.  She is on chronic oxygen.  Objective: Vitals:   06/02/18 0922 06/02/18 1441  BP:  (!) 166/47  Pulse:  76  Resp:  20  Temp:  98.2 F (36.8 C)  SpO2: 100% 99%    Filed Weights   05/31/18 1347 06/01/18 1239  Weight: 49.9 kg (110 lb) 51.3 kg (113 lb 1.5 oz)    ROS: Review of Systems  Constitutional: Negative for chills and fever.  Eyes: Negative for blurred vision.  Respiratory: Negative for cough and shortness of breath.   Cardiovascular: Negative for chest pain.  Gastrointestinal: Negative for abdominal pain, constipation, diarrhea, nausea and vomiting.  Genitourinary: Negative for dysuria.  Musculoskeletal: Negative for joint pain.  Neurological: Positive for dizziness. Negative for headaches.   Exam: Physical Exam  HENT:  Nose: No mucosal edema.  Mouth/Throat: No oropharyngeal exudate or posterior oropharyngeal edema.  Bilateral tympanic membrane bulging.  No erythema.  Eyes: Pupils are equal, round, and reactive to light. Conjunctivae, EOM and lids are normal.  Neck: No JVD present. Carotid bruit is not present. No edema present. No thyroid mass and no thyromegaly present.  Cardiovascular: S1 normal and S2 normal. Exam reveals no gallop.  No murmur heard. Pulses:      Dorsalis pedis pulses are 2+ on the right side, and 2+ on the left side.  Respiratory: No respiratory distress. She has no wheezes. She has no rhonchi. She has no rales.  GI: Soft. Bowel sounds are normal. There is no tenderness.  Musculoskeletal:       Right ankle: She exhibits no swelling.       Left ankle: She exhibits no  swelling.  Lymphadenopathy:    She has no cervical adenopathy.  Neurological: She is alert. No cranial nerve deficit.  Skin: Skin is warm. No rash noted. Nails show no clubbing.  Psychiatric: She has a normal mood and affect.      Data Reviewed: Basic Metabolic Panel: Recent Labs  Lab 05/31/18 1516 06/01/18 0529 06/02/18 0415  NA 117* 125* 131*  K 3.4* 3.6 3.5  CL 81* 92* 101  CO2 25 26 24   GLUCOSE 101* 91 92  BUN 12 8 10   CREATININE 0.59 0.55 0.70  CALCIUM 8.4* 8.3* 8.5*    Recent Labs  Lab 05/31/18 1750  AMMONIA <9*   CBC: Recent Labs  Lab 05/31/18 1516  WBC 7.0  NEUTROABS 5.1  HGB 11.1*  HCT 30.3*  MCV 88.7  PLT 313     Studies: Dg Chest 2 View  Result Date: 06/01/2018 CLINICAL DATA:  Low sodium.  History of COPD. EXAM: CHEST - 2 VIEW COMPARISON:  05/18/2018 FINDINGS: Heart size is normal. There is chronic aortic atherosclerosis. The right lung is clear except for suprahilar scarring. Left lung shows asymmetrically more pronounced suprahilar scarring. Some superimposed patchy infiltrate or atelectasis in that region is probably improved. No worsening or new findings. No significant bone abnormality. IMPRESSION: Radiographic improvement with less atelectasis/infiltrate in the left upper lobe. Suprahilar scarring bilaterally as seen previously, left more than right. Electronically Signed   By: Jan Fireman.D.  On: 06/01/2018 13:55   Ct Head Wo Contrast  Result Date: 05/31/2018 CLINICAL DATA:  Altered mental status with generalized weakness. History of lung cancer. EXAM: CT HEAD WITHOUT CONTRAST TECHNIQUE: Contiguous axial images were obtained from the base of the skull through the vertex without intravenous contrast. COMPARISON:  08/13/2014 FINDINGS: Brain: Ventricles, cisterns and other CSF spaces are within normal. There is mild chronic ischemic microvascular disease and minimal age related atrophic change. Small old lacune infarct over the right lentiform  nucleus. Vascular: No hyperdense vessel or unexpected calcification. Skull: Normal. Negative for fracture or focal lesion. Sinuses/Orbits: No acute finding. Other: None. IMPRESSION: No acute findings. Mild chronic ischemic microvascular disease and age related atrophic change. 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 Electronically Signed   By: Marin Olp M.D.   On: 05/31/2018 16:37   Mr Brain Wo Contrast  Result Date: 06/01/2018 CLINICAL DATA:  84 y/o F; acute altered mental status and increased confusion. EXAM: MRI HEAD WITHOUT CONTRAST TECHNIQUE: Multiplanar, multiecho pulse sequences of the brain and surrounding structures were obtained without intravenous contrast. COMPARISON:  05/31/2018 CT head. FINDINGS: Brain: No acute infarction, hemorrhage, hydrocephalus, extra-axial collection or mass lesion. Small chronic lacunar infarct within the right putamen. Several nonspecific foci of T2 FLAIR hyperintense signal abnormality in subcortical and periventricular white matter are compatible with mild chronic microvascular ischemic changes for age. Mild brain parenchymal volume loss. Vascular: Normal flow voids. Skull and upper cervical spine: Normal marrow signal. Sinuses/Orbits: Negative. Other: Bilateral intra-ocular lens replacement. IMPRESSION: 1. No acute intracranial abnormality identified. 2. Mild for age chronic microvascular ischemic changes and parenchymal volume loss of the brain. 3. Small chronic lacunar infarct in the right putamen. Electronically Signed   By: Kristine Garbe M.D.   On: 06/01/2018 01:04    Scheduled Meds: . diltiazem  120 mg Oral Q24H  . docusate sodium  100-200 mg Oral BID  . enoxaparin (LOVENOX) injection  40 mg Subcutaneous Q24H  . feeding supplement (ENSURE ENLIVE)  237 mL Oral BID BM  . loratadine  10 mg Oral Daily  . mouth rinse  15 mL Mouth Rinse BID  . metoprolol tartrate  50 mg Oral BID  . mometasone-formoterol  2 puff Inhalation BID  . multivitamin with  minerals  1 tablet Oral Daily  . pantoprazole  40 mg Oral Daily  . potassium chloride  10 mEq Oral Daily  . sodium chloride  2 g Oral TID WC  . tiotropium  18 mcg Inhalation Daily  . traZODone  50 mg Oral QHS  . vitamin C  250 mg Oral BID   Continuous Infusions: . 0.9 % NaCl with KCl 20 mEq / L 50 mL/hr at 06/02/18 1243    Assessment/Plan:  1. Acute hyponatremia likely with antibiotic use and not eating very well.  Improved with IV fluids 2. History of squamous cell lung cancer follows up with Dr. Rogue Bussing as outpatient. 3. Essential hypertension on Cardizem and metoprolol 4. Chronic respiratory failure with end-stage COPD on chronic oxygen 2 L. 5. GERD on PPI 6. Pressure seen in the Ears start Claritin 7. Memory loss.  May have an underlying dementia.    Code Status:     Code Status Orders  (From admission, onward)        Start     Ordered   05/31/18 1839  Do not attempt resuscitation (DNR)  Continuous    Question Answer Comment  In the event of cardiac or respiratory ARREST Do not call a "code  blue"   In the event of cardiac or respiratory ARREST Do not perform Intubation, CPR, defibrillation or ACLS   In the event of cardiac or respiratory ARREST Use medication by any route, position, wound care, and other measures to relive pain and suffering. May use oxygen, suction and manual treatment of airway obstruction as needed for comfort.   Comments confirmed with patient, nurse may pronounce      05/31/18 1838    Code Status History    Date Active Date Inactive Code Status Order ID Comments User Context   05/18/2018 2344 05/20/2018 1554 DNR [id number]  Gladstone Lighter, MD Inpatient   05/18/2018 2032 05/18/2018 2344 Full Code [id number]  Gladstone Lighter, MD Inpatient   09/09/2016 2317 09/13/2016 1654 Full Code [id number]  Lance Coon, MD Inpatient     Family Communication: Spoke with daughter on the phone Disposition Plan: Once sodium comes up and able to walk around  may be able to go home  Time spent: 28 minutes  Brandye Inthavong [facility]

## 2018-06-03 ENCOUNTER — Inpatient Hospital Stay: Payer: Medicare Other

## 2018-06-03 LAB — BASIC METABOLIC PANEL
Anion gap: 5 (ref 5–15)
BUN: 9 mg/dL (ref 8–23)
CHLORIDE: 103 mmol/L (ref 98–111)
CO2: 29 mmol/L (ref 22–32)
Calcium: 8.7 mg/dL — ABNORMAL LOW (ref 8.9–10.3)
Creatinine, Ser: 0.59 mg/dL (ref 0.44–1.00)
GFR calc non Af Amer: >60 mL/min (ref >60)
Glucose, Bld: 87 mg/dL (ref 70–99)
POTASSIUM: 4.3 mmol/L (ref 3.5–5.1)
Sodium: 137 mmol/L (ref 135–145)

## 2018-06-03 MED ORDER — IPRATROPIUM-ALBUTEROL 0.5-2.5 (3) MG/3ML IN SOLN
3.0000 mL | Freq: Four times a day (QID) | RESPIRATORY_TRACT | Status: DC
  Administered 2018-06-03 (×2): 3 mL via RESPIRATORY_TRACT
  Filled 2018-06-03 (×2): qty 3

## 2018-06-03 NOTE — Progress Notes (Signed)
Patient ID: Bethany Lambert, female   DOB: 1934/04/24, 84 y.o.   MRN: [id number]  [facility] PROGRESS NOTE  Bethany Lambert [id number] DOB: [birth year] DOA: 05/31/2018 PCP: Baxter Hire, MD  HPI/Subjective:  Patient states that she had a rough morning and was short of breath.   Objective: Vitals:   06/03/18 0625 06/03/18 1207  BP: (!) 155/61 (!) 153/54  Pulse: 69 77  Resp:  (!) 23  Temp:  98.7 F (37.1 C)  SpO2:  100%    Filed Weights   05/31/18 1347 06/01/18 1239  Weight: 49.9 kg (110 lb) 51.3 kg (113 lb 1.5 oz)    ROS: Review of Systems  Constitutional: Negative for chills and fever.  Eyes: Negative for blurred vision.  Respiratory: Positive for shortness of breath. Negative for cough.   Cardiovascular: Negative for chest pain.  Gastrointestinal: Negative for abdominal pain, constipation, diarrhea, nausea and vomiting.  Genitourinary: Negative for dysuria.  Musculoskeletal: Negative for joint pain.  Neurological: Positive for dizziness. Negative for headaches.   Exam: Physical Exam  HENT:  Nose: No mucosal edema.  Mouth/Throat: No oropharyngeal exudate or posterior oropharyngeal edema.  Bilateral tympanic membrane bulging.  No erythema.  Eyes: Pupils are equal, round, and reactive to light. Conjunctivae, EOM and lids are normal.  Neck: No JVD present. Carotid bruit is not present. No edema present. No thyroid mass and no thyromegaly present.  Cardiovascular: S1 normal and S2 normal. Exam reveals no gallop.  No murmur heard. Pulses:      Dorsalis pedis pulses are 2+ on the right side, and 2+ on the left side.  Respiratory: No respiratory distress. She has no wheezes. She has no rhonchi. She has no rales.  GI: Soft. Bowel sounds are normal. There is no tenderness.  Musculoskeletal:       Right ankle: She exhibits no swelling.       Left ankle: She exhibits no swelling.  Lymphadenopathy:    She has no cervical adenopathy.  Neurological: She is  alert. No cranial nerve deficit.  Skin: Skin is warm. No rash noted. Nails show no clubbing.  Psychiatric: She has a normal mood and affect.      Data Reviewed: Basic Metabolic Panel: Recent Labs  Lab 05/31/18 1516 06/01/18 0529 06/02/18 0415 06/03/18 0403  NA 117* 125* 131* 137  K 3.4* 3.6 3.5 4.3  CL 81* 92* 101 103  CO2 25 26 24 29   GLUCOSE 101* 91 92 87  BUN 12 8 10 9   CREATININE 0.59 0.55 0.70 0.59  CALCIUM 8.4* 8.3* 8.5* 8.7*    Recent Labs  Lab 05/31/18 1750  AMMONIA <9*   CBC: Recent Labs  Lab 05/31/18 1516  WBC 7.0  NEUTROABS 5.1  HGB 11.1*  HCT 30.3*  MCV 88.7  PLT 313     Studies: Dg Chest 2 View  Result Date: 06/01/2018 CLINICAL DATA:  Low sodium.  History of COPD. EXAM: CHEST - 2 VIEW COMPARISON:  05/18/2018 FINDINGS: Heart size is normal. There is chronic aortic atherosclerosis. The right lung is clear except for suprahilar scarring. Left lung shows asymmetrically more pronounced suprahilar scarring. Some superimposed patchy infiltrate or atelectasis in that region is probably improved. No worsening or new findings. No significant bone abnormality. IMPRESSION: Radiographic improvement with less atelectasis/infiltrate in the left upper lobe. Suprahilar scarring bilaterally as seen previously, left more than right. Electronically Signed   By: Nelson Chimes M.D.   On: 06/01/2018 13:55    Scheduled Meds: .  diltiazem  120 mg Oral Q24H  . docusate sodium  100-200 mg Oral BID  . enoxaparin (LOVENOX) injection  40 mg Subcutaneous Q24H  . feeding supplement (ENSURE ENLIVE)  237 mL Oral BID BM  . ipratropium-albuterol  3 mL Nebulization Q6H  . loratadine  10 mg Oral Daily  . mouth rinse  15 mL Mouth Rinse BID  . metoprolol tartrate  50 mg Oral BID  . mometasone-formoterol  2 puff Inhalation BID  . multivitamin with minerals  1 tablet Oral Daily  . pantoprazole  40 mg Oral Daily  . potassium chloride  10 mEq Oral Daily  . tiotropium  18 mcg Inhalation  Daily  . traZODone  50 mg Oral QHS  . vitamin C  250 mg Oral BID   Continuous Infusions:   Assessment/Plan:  1. Acute hyponatremia resolved stop IV fluids 2. Shortness of breath I will check a chest x-ray change her nebs to every 6 while awake 3. history of squamous cell lung cancer follows up with Dr. Rogue Bussing as outpatient. 4. Essential hypertension on Cardizem and metoprolol 5. Chronic respiratory failure with end-stage COPD on chronic oxygen 2 L. 6. GERD on PPI 7. Pressure seen in the Ears start Claritin 8. Memory loss.  May have an underlying dementia.    Code Status:     Code Status Orders  (From admission, onward)        Start     Ordered   05/31/18 1839  Do not attempt resuscitation (DNR)  Continuous    Question Answer Comment  In the event of cardiac or respiratory ARREST Do not call a "code blue"   In the event of cardiac or respiratory ARREST Do not perform Intubation, CPR, defibrillation or ACLS   In the event of cardiac or respiratory ARREST Use medication by any route, position, wound care, and other measures to relive pain and suffering. May use oxygen, suction and manual treatment of airway obstruction as needed for comfort.   Comments confirmed with patient, nurse may pronounce      05/31/18 1838    Code Status History    Date Active Date Inactive Code Status Order ID Comments User Context   05/18/2018 2344 05/20/2018 1554 DNR [id number]  Gladstone Lighter, MD Inpatient   05/18/2018 2032 05/18/2018 2344 Full Code [id number]  Gladstone Lighter, MD Inpatient   09/09/2016 2317 09/13/2016 1654 Full Code [id number]  Lance Coon, MD Inpatient     Family Communication: Spoke to granddaughter yesterday Disposition Plan: Disposition discharge tomorrow to home  Time spent: 28 minutes  Relena Ivancic [facility]

## 2018-06-03 NOTE — Progress Notes (Signed)
Patient ID: Bethany Lambert, female   DOB: Jun 05, 1934, 84 y.o.   MRN: [id number]  [facility] PROGRESS NOTE  Bethany Lambert [id number] DOB: 03/10/1934 DOA: 05/31/2018 PCP: Baxter Hire, MD  HPI/Subjective: Complains of feeling weak and tired sodium improving slowly   Objective: Vitals:   06/03/18 0625 06/03/18 1207  BP: (!) 155/61 (!) 153/54  Pulse: 69 77  Resp:  (!) 23  Temp:  98.7 F (37.1 C)  SpO2:  100%    Filed Weights   05/31/18 1347 06/01/18 1239  Weight: 49.9 kg (110 lb) 51.3 kg (113 lb 1.5 oz)    ROS: Review of Systems  Constitutional: Negative for chills and fever.  Eyes: Negative for blurred vision.  Respiratory: Negative for cough and shortness of breath.   Cardiovascular: Negative for chest pain.  Gastrointestinal: Negative for abdominal pain, constipation, diarrhea, nausea and vomiting.  Genitourinary: Negative for dysuria.  Musculoskeletal: Negative for joint pain.  Neurological: Positive for dizziness. Negative for headaches.   Exam: Physical Exam  HENT:  Nose: No mucosal edema.  Mouth/Throat: No oropharyngeal exudate or posterior oropharyngeal edema.  Bilateral tympanic membrane bulging.  No erythema.  Eyes: Pupils are equal, round, and reactive to light. Conjunctivae, EOM and lids are normal.  Neck: No JVD present. Carotid bruit is not present. No edema present. No thyroid mass and no thyromegaly present.  Cardiovascular: S1 normal and S2 normal. Exam reveals no gallop.  No murmur heard. Pulses:      Dorsalis pedis pulses are 2+ on the right side, and 2+ on the left side.  Respiratory: No respiratory distress. She has no wheezes. She has no rhonchi. She has no rales.  GI: Soft. Bowel sounds are normal. There is no tenderness.  Musculoskeletal:       Right ankle: She exhibits no swelling.       Left ankle: She exhibits no swelling.  Lymphadenopathy:    She has no cervical adenopathy.  Neurological: She is alert. No cranial  nerve deficit.  Skin: Skin is warm. No rash noted. Nails show no clubbing.  Psychiatric: She has a normal mood and affect.      Data Reviewed: Basic Metabolic Panel: Recent Labs  Lab 05/31/18 1516 06/01/18 0529 06/02/18 0415 06/03/18 0403  NA 117* 125* 131* 137  K 3.4* 3.6 3.5 4.3  CL 81* 92* 101 103  CO2 25 26 24 29   GLUCOSE 101* 91 92 87  BUN 12 8 10 9   CREATININE 0.59 0.55 0.70 0.59  CALCIUM 8.4* 8.3* 8.5* 8.7*    Recent Labs  Lab 05/31/18 1750  AMMONIA <9*   CBC: Recent Labs  Lab 05/31/18 1516  WBC 7.0  NEUTROABS 5.1  HGB 11.1*  HCT 30.3*  MCV 88.7  PLT 313     Studies: Dg Chest 2 View  Result Date: 06/01/2018 CLINICAL DATA:  Low sodium.  History of COPD. EXAM: CHEST - 2 VIEW COMPARISON:  05/18/2018 FINDINGS: Heart size is normal. There is chronic aortic atherosclerosis. The right lung is clear except for suprahilar scarring. Left lung shows asymmetrically more pronounced suprahilar scarring. Some superimposed patchy infiltrate or atelectasis in that region is probably improved. No worsening or new findings. No significant bone abnormality. IMPRESSION: Radiographic improvement with less atelectasis/infiltrate in the left upper lobe. Suprahilar scarring bilaterally as seen previously, left more than right. Electronically Signed   By: Nelson Chimes M.D.   On: 06/01/2018 13:55    Scheduled Meds: . diltiazem  120 mg Oral  Q24H  . docusate sodium  100-200 mg Oral BID  . enoxaparin (LOVENOX) injection  40 mg Subcutaneous Q24H  . feeding supplement (ENSURE ENLIVE)  237 mL Oral BID BM  . ipratropium-albuterol  3 mL Nebulization Q6H  . loratadine  10 mg Oral Daily  . mouth rinse  15 mL Mouth Rinse BID  . metoprolol tartrate  50 mg Oral BID  . mometasone-formoterol  2 puff Inhalation BID  . multivitamin with minerals  1 tablet Oral Daily  . pantoprazole  40 mg Oral Daily  . potassium chloride  10 mEq Oral Daily  . tiotropium  18 mcg Inhalation Daily  . traZODone   50 mg Oral QHS  . vitamin C  250 mg Oral BID   Continuous Infusions:   Assessment/Plan:  1. Acute hyponatremia likely with antibiotic use and not eating very well.  Continue IV fluids  2. history of squamous cell lung cancer follows up with Dr. Rogue Bussing as outpatient. 3. Essential hypertension on Cardizem and metoprolol 4. Chronic respiratory failure with end-stage COPD on chronic oxygen 2 L. 5. GERD on PPI 6. Pressure seen in the Ears start Claritin 7. Memory loss.  May have an underlying dementia.    Code Status:     Code Status Orders  (From admission, onward)        Start     Ordered   05/31/18 1839  Do not attempt resuscitation (DNR)  Continuous    Question Answer Comment  In the event of cardiac or respiratory ARREST Do not call a "code blue"   In the event of cardiac or respiratory ARREST Do not perform Intubation, CPR, defibrillation or ACLS   In the event of cardiac or respiratory ARREST Use medication by any route, position, wound care, and other measures to relive pain and suffering. May use oxygen, suction and manual treatment of airway obstruction as needed for comfort.   Comments confirmed with patient, nurse may pronounce      05/31/18 1838    Code Status History    Date Active Date Inactive Code Status Order ID Comments User Context   05/18/2018 2344 05/20/2018 1554 DNR [id number]  Gladstone Lighter, MD Inpatient   05/18/2018 2032 05/18/2018 2344 Full Code [id number]  Gladstone Lighter, MD Inpatient   09/09/2016 2317 09/13/2016 1654 Full Code [id number]  Lance Coon, MD Inpatient     Family Communication: Spoke with daughter on the phone Disposition Plan: Once sodium comes up and able to walk around may be able to go home  Time spent: 28 minutes  Aleeza [place] [facility]

## 2018-06-04 MED ORDER — IPRATROPIUM-ALBUTEROL 0.5-2.5 (3) MG/3ML IN SOLN
3.0000 mL | Freq: Three times a day (TID) | RESPIRATORY_TRACT | Status: DC
  Administered 2018-06-04: 3 mL via RESPIRATORY_TRACT
  Filled 2018-06-04 (×2): qty 3

## 2018-06-04 MED ORDER — LORATADINE 10 MG PO TABS: 10.0000 mg | ORAL_TABLET | Freq: Every day | ORAL | 0 refills | Status: DC

## 2018-06-04 MED ORDER — SALINE SPRAY 0.65 % NA SOLN: 1.0000 | NASAL | 0 refills | Status: DC | PRN

## 2018-06-04 MED ORDER — TRAZODONE HCL 50 MG PO TABS: 50.0000 mg | ORAL_TABLET | Freq: Every day | ORAL | 0 refills | Status: DC

## 2018-06-04 MED ORDER — SALINE SPRAY 0.65 % NA SOLN
1.0000 | NASAL | Status: DC | PRN
  Filled 2018-06-04: qty 44

## 2018-06-04 NOTE — Discharge Summary (Signed)
Bethany Lambert at [facility], 84 y.o., DOB [birth year], MRN [id number]. Admission date: 05/31/2018 Discharge Date 06/04/2018 Primary MD Baxter Hire, MD Admitting Physician Gorden Harms, MD  Admission Diagnosis  Hyponatremia [E87.1] Altered mental status, unspecified altered mental status type [R41.82]  Discharge Diagnosis   Active Problems: Acute hyponatremia Acute shortness of breath on chronic respiratory failure History of squamous cell lung cancer Essential hypertension End-stage COPD GERD Memory loss with possible dementia   Hospital CourseJosephine Lambert  is a 84 y.o. female with a known history per below, discharged earlier this month on June 16 for hyponatremia and pneumonia, presents today with acute altered mental status, increased confusion noted by outpatient provider, was sent to the emergency room for further evaluation/care, sodium on work-up was noted to be 119, patient also complained of generalized weakness/fatigue.  Patient was admitted and given IV fluids with her sodium being normalized.  She was seen in consultation by PT recommended home health.  Patient is anxious about going home.  However she does not meet any criteria to remain in the hospital or go to rehab. Patient currently medically stable for discharge            Consults  None  Significant Tests:  See full reports for all details     Ct Abdomen Pelvis Wo Contrast  Result Date: 05/18/2018 CLINICAL DATA:  Abdominal distension for over 2 weeks. EXAM: CT ABDOMEN AND PELVIS WITHOUT CONTRAST TECHNIQUE: Multidetector CT imaging of the abdomen and pelvis was performed following the standard protocol without IV contrast. COMPARISON:  CT abdomen and pelvis 07/06/2007. PET CT scan 03/02/2016. FINDINGS: Lower chest: Emphysematous disease is present. 0.4 cm nodular opacity in the right lower and a 0.3 cm nodular opacity in the left lower lobe are seen on image  1. Lung bases are otherwise unremarkable. Hepatobiliary: No focal liver abnormality is seen. No gallstones, gallbladder wall thickening, or biliary dilatation. Pancreas: 2 cystic lesions in the tail the pancreas are unchanged since the prior PET CT scan and likely due to some prior inflammatory process. The pancreas is otherwise unremarkable. Spleen: Normal in size without focal abnormality. Adrenals/Urinary Tract: Tiny right adrenal adenoma is unchanged. The left adrenal gland is unremarkable. 2-3 punctate calcifications in the right kidney could be tiny nonobstructing stones or vascular. 2 punctate calcifications in the left kidney could also be vascular or nonobstructing stones. There is no hydronephrosis. No ureteral stone. 0.7 cm lesion in the midpole the left kidney demonstrates increased density and is likely a complex cyst. Urinary bladder appears normal. Stomach/Bowel: Sigmoid diverticulosis without diverticulitis is identified. Postoperative change the hepatic flexure of the colon appears unchanged. The stomach, small bowel and appendix appear normal. Vascular/Lymphatic: Extensive atherosclerotic vascular disease is present. No aneurysm. No lymphadenopathy. Reproductive: Status post hysterectomy. No adnexal masses. Other: No fluid collection. Musculoskeletal: No acute or focal bony abnormality. Bilateral hip osteoarthritis is seen. IMPRESSION: No acute abnormality abdomen or pelvis. No finding to explain the patient's symptoms. 0.4 cm right lower lobe and 0.3 cm left lower lobe nodules are not seen on the prior exams. No follow-up needed if patient is low-risk (and has no known or suspected primary neoplasm). Non-contrast chest CT can be considered in 12 months if patient is high-risk. This recommendation follows the consensus statement: Guidelines for Management of Incidental Pulmonary Nodules Detected on CT Images: From the Fleischner Society 2017; Radiology 2017; 284:228-243. Extensive atherosclerosis.  Punctate nonobstructing stones versus vascular calcifications in the  kidneys. Emphysema. Electronically Signed   By: Inge Rise M.D.   On: 05/18/2018 18:39   Dg Chest 2 View  Result Date: 06/03/2018 CLINICAL DATA:  Former smoker, hx of lung CA 2013, COPD, Recent discharge and re-admission to hospital for weakness. EXAM: CHEST - 2 VIEW COMPARISON:  06/01/2018 FINDINGS: Stable post XRT changes in the left suprahilar region and medial right upper lung. Lungs otherwise clear. Heart size and mediastinal contours are within normal limits. Aortic Atherosclerosis (ICD10-170.0). No effusion.  No pneumothorax. Vertebral endplate spurring at multiple levels in the mid thoracic spine. IMPRESSION: 1. Stable XRT and degenerative changes as above.  No acute disease. Electronically Signed   By: Lucrezia Europe M.D.   On: 06/03/2018 14:10   Dg Chest 2 View  Result Date: 06/01/2018 CLINICAL DATA:  Low sodium.  History of COPD. EXAM: CHEST - 2 VIEW COMPARISON:  05/18/2018 FINDINGS: Heart size is normal. There is chronic aortic atherosclerosis. The right lung is clear except for suprahilar scarring. Left lung shows asymmetrically more pronounced suprahilar scarring. Some superimposed patchy infiltrate or atelectasis in that region is probably improved. No worsening or new findings. No significant bone abnormality. IMPRESSION: Radiographic improvement with less atelectasis/infiltrate in the left upper lobe. Suprahilar scarring bilaterally as seen previously, left more than right. Electronically Signed   By: Nelson Chimes M.D.   On: 06/01/2018 13:55   Dg Chest 2 View  Result Date: 05/18/2018 CLINICAL DATA:  Dyspnea, history of bilateral upper lobe lung cancer EXAM: CHEST - 2 VIEW COMPARISON:  05/04/2018 chest radiograph. FINDINGS: Surgical clips overlie the right upper quadrant of the abdomen. Stable cardiomediastinal silhouette with normal heart size. No pneumothorax. No pleural effusion. Hyperinflated lungs. Emphysema.  Patchy posterior left upper lobe consolidation, increased since 05/04/2018, superimposed on chronic left upper perihilar opacity. IMPRESSION: 1. Increased patchy posterior left upper lobe consolidation since 05/04/2018 chest radiographs, presumably representing pneumonia as suggested on 05/04/2018 chest CT. 2. This finding is superimposed on chronic left upper perihilar opacity compatible with post treatment change. 3. Short-term follow-up chest CT with IV contrast suggested after a course of antibiotic therapy in 1-2 months to document resolution and to exclude recurrent tumor. 4. Hyperinflated lungs and emphysema, suggesting COPD. Electronically Signed   By: Ilona Sorrel M.D.   On: 05/18/2018 17:29   Ct Head Wo Contrast  Result Date: 05/31/2018 CLINICAL DATA:  Altered mental status with generalized weakness. History of lung cancer. EXAM: CT HEAD WITHOUT CONTRAST TECHNIQUE: Contiguous axial images were obtained from the base of the skull through the vertex without intravenous contrast. COMPARISON:  08/13/2014 FINDINGS: Brain: Ventricles, cisterns and other CSF spaces are within normal. There is mild chronic ischemic microvascular disease and minimal age related atrophic change. Small old lacune infarct over the right lentiform nucleus. Vascular: No hyperdense vessel or unexpected calcification. Skull: Normal. Negative for fracture or focal lesion. Sinuses/Orbits: No acute finding. Other: None. IMPRESSION: No acute findings. Mild chronic ischemic microvascular disease and age related atrophic change. 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 Electronically Signed   By: Marin Olp M.D.   On: 05/31/2018 16:37   Mr Brain Wo Contrast  Result Date: 06/01/2018 CLINICAL DATA:  84 y/o F; acute altered mental status and increased confusion. EXAM: MRI HEAD WITHOUT CONTRAST TECHNIQUE: Multiplanar, multiecho pulse sequences of the brain and surrounding structures were obtained without intravenous contrast. COMPARISON:   05/31/2018 CT head. FINDINGS: Brain: No acute infarction, hemorrhage, hydrocephalus, extra-axial collection or mass lesion. Small chronic lacunar infarct within the right  putamen. Several nonspecific foci of T2 FLAIR hyperintense signal abnormality in subcortical and periventricular white matter are compatible with mild chronic microvascular ischemic changes for age. Mild brain parenchymal volume loss. Vascular: Normal flow voids. Skull and upper cervical spine: Normal marrow signal. Sinuses/Orbits: Negative. Other: Bilateral intra-ocular lens replacement. IMPRESSION: 1. No acute intracranial abnormality identified. 2. Mild for age chronic microvascular ischemic changes and parenchymal volume loss of the brain. 3. Small chronic lacunar infarct in the right putamen. Electronically Signed   By: Kristine Garbe M.D.   On: 06/01/2018 01:04       Today   Subjective:   Porshia Blizzard complains of feeling little weak Objective:   Blood pressure (!) 143/61, pulse 79, temperature 98.5 F (36.9 C), temperature source Oral, resp. rate 19, height 4\' 10"  (1.473 m), weight 51.3 kg (113 lb 1.5 oz), SpO2 100 %.  .  Intake/Output Summary (Last 24 hours) at 06/04/2018 1246 Last data filed at 06/04/2018 1014 Gross per 24 hour  Intake 170 ml  Output -  Net 170 ml    Exam VITAL SIGNS: Blood pressure (!) 143/61, pulse 79, temperature 98.5 F (36.9 C), temperature source Oral, resp. rate 19, height 4\' 10"  (1.473 m), weight 51.3 kg (113 lb 1.5 oz), SpO2 100 %.  GENERAL:  84 y.o.-year-old patient lying in the bed with no acute distress.  EYES: Pupils equal, round, reactive to light and accommodation. No scleral icterus. Extraocular muscles intact.  HEENT: Head atraumatic, normocephalic. Oropharynx and nasopharynx clear.  NECK:  Supple, no jugular venous distention. No thyroid enlargement, no tenderness.  LUNGS: Normal breath sounds bilaterally, no wheezing, rales,rhonchi or crepitation. No use of  accessory muscles of respiration.  CARDIOVASCULAR: S1, S2 normal. No murmurs, rubs, or gallops.  ABDOMEN: Soft, nontender, nondistended. Bowel sounds present. No organomegaly or mass.  EXTREMITIES: No pedal edema, cyanosis, or clubbing.  NEUROLOGIC: Cranial nerves II through XII are intact. Muscle strength 5/5 in all extremities. Sensation intact. Gait not checked.  PSYCHIATRIC: The patient is alert and oriented x 3.  SKIN: No obvious rash, lesion, or ulcer.   Data Review     CBC w Diff:  Lab Results  Component Value Date   WBC 7.0 05/31/2018   HGB 11.1 (L) 05/31/2018   HGB 10.8 (L) 12/29/2014   HCT 30.3 (L) 05/31/2018   HCT 32.1 (L) 12/29/2014   PLT 313 05/31/2018   PLT 165 12/29/2014   LYMPHOPCT 18 05/31/2018   LYMPHOPCT 18.6 12/01/2014   MONOPCT 8 05/31/2018   MONOPCT 10.2 12/01/2014   EOSPCT 0 05/31/2018   EOSPCT 1.7 12/01/2014   BASOPCT 1 05/31/2018   BASOPCT 0.7 12/01/2014   CMP:  Lab Results  Component Value Date   NA 137 06/03/2018   NA 136 12/29/2014   K 4.3 06/03/2018   K 4.0 12/29/2014   CL 103 06/03/2018   CL 102 12/29/2014   CO2 29 06/03/2018   CO2 28 12/29/2014   BUN 9 06/03/2018   BUN 15 12/29/2014   CREATININE 0.59 06/03/2018   CREATININE 1.23 12/29/2014   PROT 6.6 05/18/2018   PROT 6.3 (L) 12/01/2014   ALBUMIN 2.8 (L) 05/18/2018   ALBUMIN 3.2 (L) 12/01/2014   BILITOT 0.5 05/18/2018   BILITOT 0.3 12/01/2014   ALKPHOS 60 05/18/2018   ALKPHOS 100 12/01/2014   AST 20 05/18/2018   AST 14 (L) 12/01/2014   ALT 17 05/18/2018   ALT 22 12/01/2014  .  Micro Results No results found for this or  any previous visit (from the past 240 hour(s)).      Code Status Orders  (From admission, onward)        Start     Ordered   05/31/18 1839  Do not attempt resuscitation (DNR)  Continuous    Question Answer Comment  In the event of cardiac or respiratory ARREST Do not call a "code blue"   In the event of cardiac or respiratory ARREST Do not perform  Intubation, CPR, defibrillation or ACLS   In the event of cardiac or respiratory ARREST Use medication by any route, position, wound care, and other measures to relive pain and suffering. May use oxygen, suction and manual treatment of airway obstruction as needed for comfort.   Comments confirmed with patient, nurse may pronounce      05/31/18 1838    Code Status History    Date Active Date Inactive Code Status Order ID Comments User Context   05/18/2018 2344 05/20/2018 1554 DNR [id number]  Gladstone Lighter, MD Inpatient   05/18/2018 2032 05/18/2018 2344 Full Code [id number]  Gladstone Lighter, MD Inpatient   09/09/2016 2317 09/13/2016 1654 Full Code [id number]  Lance Coon, MD Inpatient          Follow-up Information    Baxter Hire, MD. Go on 06/12/2018.   Specialty:  Internal Medicine Why:  at 9:00 a.m. Contact information: [place] [address] [phone]           Discharge Medications   Allergies as of 06/04/2018      Reactions   Ciprofloxacin    Fluocinolone Other (See Comments)   Norvasc [amlodipine Besylate]       Medication List    TAKE these medications   albuterol 108 (90 Base) MCG/ACT inhaler Commonly known as:  PROVENTIL HFA;VENTOLIN HFA Inhale 2 puffs into the lungs every 6 (six) hours as needed for wheezing.   diltiazem 120 MG 24 hr capsule Commonly known as:  DILACOR XR Take 120 mg by mouth daily.   docusate sodium 100 MG capsule Commonly known as:  COLACE Take 100-200 mg by mouth 2 (two) times daily. 100 mg in the morning and 200 mg at bedtime   fluocinonide 0.05 % external solution Commonly known as:  LIDEX Apply 1 application topically 2 (two) times daily.   ipratropium-albuterol 0.5-2.5 (3) MG/3ML Soln Commonly known as:  DUONEB Take 3 mLs by nebulization every 6 (six) hours as needed (wheezing).   loratadine 10 MG tablet Commonly known as:  CLARITIN Take 1 tablet (10 mg total) by mouth daily. Start taking  on:  06/05/2018   metoprolol tartrate 50 MG tablet Commonly known as:  LOPRESSOR Take 1 tablet (50 mg total) by mouth 2 (two) times daily.   omeprazole 20 MG capsule Commonly known as:  PRILOSEC Take 20 mg by mouth 2 (two) times daily before a meal.   OXYGEN Inhale 3 L into the lungs continuous.   potassium chloride 10 MEQ tablet Commonly known as:  K-DUR,KLOR-CON Take 10 mEq by mouth daily.   sodium chloride 0.65 % Soln nasal spray Commonly known as:  OCEAN Place 1 spray into both nostrils as needed for congestion.   SYMBICORT 80-4.5 MCG/ACT inhaler Generic drug:  budesonide-formoterol USE TWO PUFFS TWO TIMES A DAY   tiotropium 18 MCG inhalation capsule Commonly known as:  SPIRIVA Place 18 mcg into inhaler and inhale daily.   traZODone 50 MG tablet Commonly known as:  DESYREL Take 1 tablet (50 mg total)  by mouth at bedtime.   VITAMIN B-12 IJ Inject 1,000 mcg as directed every 30 (thirty) days.          Total Time in preparing paper work, data evaluation and todays exam - 66 minutes  Dustin Flock M.D on 06/04/2018 at 12:46 PM [place]  [phone]

## 2018-06-04 NOTE — Progress Notes (Signed)
Pt is being discharged today, instructions reviewed with the patient and her daughter, both verified understanding. IV's x2 were removed and all belongings were packed and returned to the patient. 0 paper prescriptions were given to them. She was rolled out in a wheelchair by staff.

## 2018-06-04 NOTE — Care Management Note (Signed)
Case Management Note  Patient Details  Name: ALAZNE QUANT MRN: [id number] Date of Birth: 11-01-1934  Subjective/Objective:   RNCM previously met with patient regarding home health services. Spoke with patient again to confirm plans [facility]. [facility] is still in agreement to use [facility]. Referral placed with Corene Cornea who is aware of discharge planning. Patient currently receives oxygen via [facility] and is open with them with PT only. Will add RN and aide services. Daughter will provide transport at discharge.  Merrily Pew Sondos Wolfman RN BSN RNCM [phone]                   Action/Plan:   Expected Discharge Date:  06/04/18               Expected Discharge Plan:     In-House Referral:     Discharge planning Services  CM Consult  Post Acute Care Choice:  Home Health, Resumption of Svcs/PTA Provider Choice offered to:  Patient, Adult Children  DME Arranged:    DME Agency:     HH Arranged:  RN, PT, Nurse's Aide [place] Agency:  [facility]  Status of Service:  Completed, signed off  If discussed at [place] of Stay Meetings, dates discussed:    Additional Comments:  Latanya Maudlin, RN 06/04/2018, 9:53 AM

## 2018-06-04 NOTE — Care Management Important Message (Signed)
Important Message  Patient Details  Name: Bethany Lambert MRN: [id number] Date of Birth: 1934/05/02   Medicare Important Message Given:  Yes    Juliann Pulse A Pearline Yerby 06/04/2018, 10:44 AM

## 2018-06-05 DIAGNOSIS — J189 Pneumonia, unspecified organism: Secondary | ICD-10-CM | POA: Diagnosis not present

## 2018-06-05 DIAGNOSIS — J439 Emphysema, unspecified: Secondary | ICD-10-CM | POA: Diagnosis not present

## 2018-06-05 DIAGNOSIS — J40 Bronchitis, not specified as acute or chronic: Secondary | ICD-10-CM | POA: Diagnosis not present

## 2018-06-05 DIAGNOSIS — E785 Hyperlipidemia, unspecified: Secondary | ICD-10-CM | POA: Diagnosis not present

## 2018-06-05 DIAGNOSIS — Z9981 Dependence on supplemental oxygen: Secondary | ICD-10-CM | POA: Diagnosis not present

## 2018-06-05 DIAGNOSIS — Z792 Long term (current) use of antibiotics: Secondary | ICD-10-CM | POA: Diagnosis not present

## 2018-06-05 DIAGNOSIS — Z9181 History of falling: Secondary | ICD-10-CM | POA: Diagnosis not present

## 2018-06-05 DIAGNOSIS — C3492 Malignant neoplasm of unspecified part of left bronchus or lung: Secondary | ICD-10-CM | POA: Diagnosis not present

## 2018-06-05 DIAGNOSIS — I10 Essential (primary) hypertension: Secondary | ICD-10-CM | POA: Diagnosis not present

## 2018-06-06 DIAGNOSIS — I10 Essential (primary) hypertension: Secondary | ICD-10-CM | POA: Diagnosis not present

## 2018-06-06 DIAGNOSIS — C3492 Malignant neoplasm of unspecified part of left bronchus or lung: Secondary | ICD-10-CM | POA: Diagnosis not present

## 2018-06-06 DIAGNOSIS — J40 Bronchitis, not specified as acute or chronic: Secondary | ICD-10-CM | POA: Diagnosis not present

## 2018-06-06 DIAGNOSIS — Z792 Long term (current) use of antibiotics: Secondary | ICD-10-CM | POA: Diagnosis not present

## 2018-06-06 DIAGNOSIS — Z9981 Dependence on supplemental oxygen: Secondary | ICD-10-CM | POA: Diagnosis not present

## 2018-06-06 DIAGNOSIS — J189 Pneumonia, unspecified organism: Secondary | ICD-10-CM | POA: Diagnosis not present

## 2018-06-06 DIAGNOSIS — J439 Emphysema, unspecified: Secondary | ICD-10-CM | POA: Diagnosis not present

## 2018-06-06 DIAGNOSIS — E785 Hyperlipidemia, unspecified: Secondary | ICD-10-CM | POA: Diagnosis not present

## 2018-06-06 DIAGNOSIS — Z9181 History of falling: Secondary | ICD-10-CM | POA: Diagnosis not present

## 2018-06-07 ENCOUNTER — Telehealth: Payer: Self-pay

## 2018-06-07 NOTE — Telephone Encounter (Signed)
EMMI Follow-up: Noted on the report that the patient wasn't sure if she had received her discharge paperwork, if she had any new Rx's and who to contact if there was a change in her condition. Left a message for her to call me at her convenience.   Will follow-up again.

## 2018-06-08 ENCOUNTER — Telehealth: Payer: Self-pay

## 2018-06-08 DIAGNOSIS — J189 Pneumonia, unspecified organism: Secondary | ICD-10-CM | POA: Diagnosis not present

## 2018-06-08 DIAGNOSIS — E785 Hyperlipidemia, unspecified: Secondary | ICD-10-CM | POA: Diagnosis not present

## 2018-06-08 DIAGNOSIS — Z792 Long term (current) use of antibiotics: Secondary | ICD-10-CM | POA: Diagnosis not present

## 2018-06-08 DIAGNOSIS — C3492 Malignant neoplasm of unspecified part of left bronchus or lung: Secondary | ICD-10-CM | POA: Diagnosis not present

## 2018-06-08 DIAGNOSIS — J40 Bronchitis, not specified as acute or chronic: Secondary | ICD-10-CM | POA: Diagnosis not present

## 2018-06-08 DIAGNOSIS — J439 Emphysema, unspecified: Secondary | ICD-10-CM | POA: Diagnosis not present

## 2018-06-08 DIAGNOSIS — Z9981 Dependence on supplemental oxygen: Secondary | ICD-10-CM | POA: Diagnosis not present

## 2018-06-08 DIAGNOSIS — Z9181 History of falling: Secondary | ICD-10-CM | POA: Diagnosis not present

## 2018-06-08 DIAGNOSIS — I10 Essential (primary) hypertension: Secondary | ICD-10-CM | POA: Diagnosis not present

## 2018-06-08 NOTE — Telephone Encounter (Signed)
EMMI Follow-up: 2nd attempt to reach Ms. Bethany Lambert.  Left a message for her to contact me at her convenience should she have any questions/concerns.

## 2018-06-09 DIAGNOSIS — J449 Chronic obstructive pulmonary disease, unspecified: Secondary | ICD-10-CM | POA: Diagnosis not present

## 2018-06-11 ENCOUNTER — Other Ambulatory Visit: Payer: Self-pay

## 2018-06-11 ENCOUNTER — Encounter: Payer: Self-pay | Admitting: Emergency Medicine

## 2018-06-11 ENCOUNTER — Emergency Department
Admission: EM | Admit: 2018-06-11 | Discharge: 2018-06-11 | Disposition: A | Discharge status: Home / Self Care | Payer: Medicare Other | Attending: Emergency Medicine | Admitting: Emergency Medicine

## 2018-06-11 ENCOUNTER — Emergency Department: Payer: Medicare Other

## 2018-06-11 ENCOUNTER — Other Ambulatory Visit: Payer: Self-pay | Admitting: Internal Medicine

## 2018-06-11 DIAGNOSIS — W19XXXA Unspecified fall, initial encounter: Secondary | ICD-10-CM

## 2018-06-11 DIAGNOSIS — M533 Sacrococcygeal disorders, not elsewhere classified: Secondary | ICD-10-CM | POA: Diagnosis not present

## 2018-06-11 DIAGNOSIS — W0110XA Fall on same level from slipping, tripping and stumbling with subsequent striking against unspecified object, initial encounter: Secondary | ICD-10-CM | POA: Diagnosis not present

## 2018-06-11 DIAGNOSIS — I10 Essential (primary) hypertension: Secondary | ICD-10-CM | POA: Insufficient documentation

## 2018-06-11 DIAGNOSIS — Y929 Unspecified place or not applicable: Secondary | ICD-10-CM | POA: Diagnosis not present

## 2018-06-11 DIAGNOSIS — Y999 Unspecified external cause status: Secondary | ICD-10-CM | POA: Insufficient documentation

## 2018-06-11 DIAGNOSIS — S0990XA Unspecified injury of head, initial encounter: Secondary | ICD-10-CM | POA: Insufficient documentation

## 2018-06-11 DIAGNOSIS — R51 Headache: Secondary | ICD-10-CM | POA: Diagnosis not present

## 2018-06-11 DIAGNOSIS — Y9389 Activity, other specified: Secondary | ICD-10-CM | POA: Diagnosis not present

## 2018-06-11 DIAGNOSIS — N390 Urinary tract infection, site not specified: Secondary | ICD-10-CM | POA: Diagnosis not present

## 2018-06-11 DIAGNOSIS — Z79899 Other long term (current) drug therapy: Secondary | ICD-10-CM | POA: Diagnosis not present

## 2018-06-11 DIAGNOSIS — Z87891 Personal history of nicotine dependence: Secondary | ICD-10-CM | POA: Diagnosis not present

## 2018-06-11 DIAGNOSIS — S3993XA Unspecified injury of pelvis, initial encounter: Secondary | ICD-10-CM | POA: Diagnosis not present

## 2018-06-11 DIAGNOSIS — S300XXA Contusion of lower back and pelvis, initial encounter: Secondary | ICD-10-CM | POA: Insufficient documentation

## 2018-06-11 MED ORDER — CEPHALEXIN 500 MG PO CAPS: 500.0000 mg | ORAL_CAPSULE | Freq: Two times a day (BID) | ORAL | 0 refills | Status: DC

## 2018-06-11 NOTE — ED Provider Notes (Signed)
[facility] Emergency Department Provider Note   ____________________________________________    I have reviewed the triage vital signs and the nursing notes.   HISTORY  Chief Complaint Fall     HPI Bethany Lambert is a 84 y.o. female who presents after a mechanical fall.  Patient reports she was trying to pull a box off of her dresser and lost her balance and fell backwards onto her buttocks and then fell back and hit her head.  She denies LOC.  Patient does complain of dizziness but reports her dizziness is at baseline and chronic.  Complains primarily of tailbone pain and a very mild headache.  No neck pain.  No chest wall pain.  No abdominal pain nausea or vomiting.  Not on blood thinners   Past Medical History:  Diagnosis Date  . COPD (chronic obstructive pulmonary disease) (HCC)    on 2L home o2  . Erythrocytosis   . GERD (gastroesophageal reflux disease)   . History of chemotherapy   . History of radiation therapy   . Hyperlipidemia   . Hypertension   . Kidney failure   . Lung cancer ([place])    squamous, stage 3 non small cell lung ca  . Polycythemia   . Psoriasis     Patient Active Problem List   Diagnosis Date Noted  . Hyponatremia 05/31/2018  . Pneumonia 05/18/2018  . Pancreatic cyst 07/10/2017  . COPD exacerbation ([place]) 09/09/2016  . GERD (gastroesophageal reflux disease) 09/09/2016  . HTN (hypertension) 09/09/2016  . Cancer of upper lobe of left lung ([place]) 07/06/2016  . Tachycardia 02/03/2013  . Dyspnea 02/03/2013    Past Surgical History:  Procedure Laterality Date  . CATARACT EXTRACTION    . COLON RESECTION    . CT GUIDED BIOPSY  ([facility] HX)  11/19/2012   lung  . HERNIA REPAIR    . TOTAL ABDOMINAL HYSTERECTOMY      Prior to Admission medications   Medication Sig Start Date End Date Taking? Authorizing Provider  albuterol (PROVENTIL HFA;VENTOLIN HFA) 108 (90 Base) MCG/ACT inhaler Inhale 2 puffs into the lungs  every 6 (six) hours as needed for wheezing. 03/27/18   Lavera Guise, MD  cephALEXin (KEFLEX) 500 MG capsule Take 1 capsule (500 mg total) by mouth 2 (two) times daily. 06/11/18   Lavonia Drafts, MD  Cyanocobalamin (VITAMIN B-12 IJ) Inject 1,000 mcg as directed every 30 (thirty) days.     [provider]  diltiazem (DILACOR XR) 120 MG 24 hr capsule Take 120 mg by mouth daily.    [provider]  docusate sodium (COLACE) 100 MG capsule Take 100-200 mg by mouth 2 (two) times daily. 100 mg in the morning and 200 mg at bedtime    [provider]  fluocinonide (LIDEX) 0.05 % external solution Apply 1 application topically 2 (two) times daily.  08/18/16   [provider]  ipratropium-albuterol (DUONEB) 0.5-2.5 (3) MG/3ML SOLN Take 3 mLs by nebulization every 6 (six) hours as needed (wheezing).     [provider]  loratadine (CLARITIN) 10 MG tablet Take 1 tablet (10 mg total) by mouth daily. 06/05/18   Dustin Flock, MD  metoprolol (LOPRESSOR) 50 MG tablet Take 1 tablet (50 mg total) by mouth 2 (two) times daily. 01/31/13   Wellington Hampshire, MD  omeprazole (PRILOSEC) 20 MG capsule Take 20 mg by mouth 2 (two) times daily before a meal.     [provider]  OXYGEN Inhale  3 L into the lungs continuous.     [provider]  potassium chloride (K-DUR,KLOR-CON) 10 MEQ tablet Take 10 mEq by mouth daily.    [provider]  sodium chloride (OCEAN) 0.65 % SOLN nasal spray Place 1 spray into both nostrils as needed for congestion. 06/04/18   Dustin Flock, MD  SYMBICORT 80-4.5 MCG/ACT inhaler USE TWO PUFFS TWO TIMES A DAY 03/15/18   Ronnell Freshwater, NP  tiotropium (SPIRIVA) 18 MCG inhalation capsule Place 18 mcg into inhaler and inhale daily.    [provider]  traZODone (DESYREL) 50 MG tablet Take 1 tablet (50 mg total) by mouth at bedtime. 06/04/18   Dustin Flock, MD     Allergies Ciprofloxacin; Fluocinolone; and Norvasc [amlodipine  besylate]  Family History  Problem Relation Age of Onset  . Heart attack Father   . Colon cancer Father   . Stroke Mother   . Cancer Son     Social History Social History   Tobacco Use  . Smoking status: Former Smoker    Packs/day: 2.00    Years: 40.00    Pack years: 80.00    Types: Cigarettes  . Smokeless tobacco: Never Used  Substance Use Topics  . Alcohol use: No    Alcohol/week: 0.0 oz  . Drug use: No    Review of Systems  Constitutional: Chronic dizziness Eyes: No visual changes.  ENT: No neck pain Cardiovascular: Denies chest pain. Respiratory: Denies shortness of breath. Gastrointestinal: No abdominal pain.  No nausea, no vomiting.  Genitourinary: Negative for dysuria. Musculoskeletal: Aching pain in the tailbone Skin: 6 skin tear left elbow Neurological: Negative for focal weakness   ____________________________________________   PHYSICAL EXAM:  VITAL SIGNS: ED Triage Vitals  Enc Vitals Group     BP 06/11/18 0753 (!) 168/70     Pulse Rate 06/11/18 0753 90     Resp 06/11/18 0753 18     Temp 06/11/18 0753 98.2 F (36.8 C)     Temp src --      SpO2 06/11/18 0753 100 %     Weight 06/11/18 0754 51.3 kg (113 lb)     Height --      Head Circumference --      Peak Flow --      Pain Score 06/11/18 0753 4     Pain Loc --      Pain Edu? --      Excl. in [place]? --     Constitutional: Alert and oriented.  Pleasant and interactive Eyes: Conjunctivae are normal.  Head: Atraumatic.  No hematoma or laceration Nose: No congestion/rhinnorhea. Mouth/Throat: Mucous membranes are moist.   Neck:  Painless ROM, no vertebral tenderness palpation Cardiovascular: Normal rate, regular rhythm. Grossly normal heart sounds.  Good peripheral circulation. Respiratory: Normal respiratory effort.  No retractions. Lungs CTAB. Gastrointestinal: Soft and nontender. No distention.    Musculoskeletal: No vertebral tenderness to palpation of CT and L-spine, some pain overlying  the coccyx, patient is able to range both lower extremities without hip pain.  No chest wall pain.  No clavicular pain.  No upper extremity bony injuries Neurologic:  Normal speech and language. No gross focal neurologic deficits are appreciated.  Skin:  Skin is warm, dry.  3 x 3 cm skin tear noted to the left elbow, no active bleeding Psychiatric: Mood and affect are normal. Speech and behavior are normal.  ____________________________________________   LABS (all labs ordered are listed, but only abnormal results are displayed)  Labs Reviewed - No data to display ____________________________________________  EKG  None ____________________________________________  RADIOLOGY  CT head no acute distress Pelvis x-ray negative for fracture ____________________________________________   PROCEDURES  Procedure(s) performed: No  Procedures   Critical Care performed: No ____________________________________________   INITIAL IMPRESSION / ASSESSMENT AND PLAN / ED COURSE  Pertinent labs & imaging results that were available during my care of the patient were reviewed by me and considered in my medical decision making (see chart for details).  Patient overall well-appearing in no acute distress.  Exam is reassuring.  Given head injury and coccyx pain will obtain imaging no indication for lab work at this time.  Patient also describes fullness sensation in her bladder and difficulty urinating suspicious for urinary tract infection.  Will start patient on Keflex x1 week, outpatient follow-up recommended    ____________________________________________   FINAL CLINICAL IMPRESSION(S) / ED DIAGNOSES  Final diagnoses:  Fall, initial encounter  Contusion of lower back, initial encounter  Lower urinary tract infectious disease        Note:  This document was prepared using Dragon voice recognition software and may include unintentional dictation errors.    Lavonia Drafts,  MD 06/11/18 1014

## 2018-06-11 NOTE — ED Triage Notes (Signed)
Pt to ed with c/o fall this am.  Reports she was standing on the floor and was trying to pull a box off the top of her dresser, as it came off the shelf, she fell backwards first hitting her buttocks and then hitting her head on the carpeted floor.  Pt denies loss of consciousness however reports since the fall her balance is off and she is unable to stand on her own without assistance.

## 2018-06-11 NOTE — ED Notes (Signed)
AAOx3.  Skin warm and dry.  NAD 

## 2018-06-12 DIAGNOSIS — I10 Essential (primary) hypertension: Secondary | ICD-10-CM | POA: Diagnosis not present

## 2018-06-12 DIAGNOSIS — C3492 Malignant neoplasm of unspecified part of left bronchus or lung: Secondary | ICD-10-CM | POA: Diagnosis not present

## 2018-06-12 DIAGNOSIS — J40 Bronchitis, not specified as acute or chronic: Secondary | ICD-10-CM | POA: Diagnosis not present

## 2018-06-12 DIAGNOSIS — J189 Pneumonia, unspecified organism: Secondary | ICD-10-CM | POA: Diagnosis not present

## 2018-06-12 DIAGNOSIS — Z09 Encounter for follow-up examination after completed treatment for conditions other than malignant neoplasm: Secondary | ICD-10-CM | POA: Diagnosis not present

## 2018-06-12 DIAGNOSIS — E871 Hypo-osmolality and hyponatremia: Secondary | ICD-10-CM | POA: Diagnosis not present

## 2018-06-12 DIAGNOSIS — J439 Emphysema, unspecified: Secondary | ICD-10-CM | POA: Diagnosis not present

## 2018-06-13 DIAGNOSIS — J439 Emphysema, unspecified: Secondary | ICD-10-CM | POA: Diagnosis not present

## 2018-06-13 DIAGNOSIS — I10 Essential (primary) hypertension: Secondary | ICD-10-CM | POA: Diagnosis not present

## 2018-06-13 DIAGNOSIS — J40 Bronchitis, not specified as acute or chronic: Secondary | ICD-10-CM | POA: Diagnosis not present

## 2018-06-13 DIAGNOSIS — C3492 Malignant neoplasm of unspecified part of left bronchus or lung: Secondary | ICD-10-CM | POA: Diagnosis not present

## 2018-06-13 DIAGNOSIS — Z792 Long term (current) use of antibiotics: Secondary | ICD-10-CM | POA: Diagnosis not present

## 2018-06-13 DIAGNOSIS — Z9981 Dependence on supplemental oxygen: Secondary | ICD-10-CM | POA: Diagnosis not present

## 2018-06-13 DIAGNOSIS — Z9181 History of falling: Secondary | ICD-10-CM | POA: Diagnosis not present

## 2018-06-13 DIAGNOSIS — J189 Pneumonia, unspecified organism: Secondary | ICD-10-CM | POA: Diagnosis not present

## 2018-06-13 DIAGNOSIS — E785 Hyperlipidemia, unspecified: Secondary | ICD-10-CM | POA: Diagnosis not present

## 2018-06-14 DIAGNOSIS — E871 Hypo-osmolality and hyponatremia: Secondary | ICD-10-CM | POA: Diagnosis not present

## 2018-06-15 DIAGNOSIS — Z9181 History of falling: Secondary | ICD-10-CM | POA: Diagnosis not present

## 2018-06-15 DIAGNOSIS — Z792 Long term (current) use of antibiotics: Secondary | ICD-10-CM | POA: Diagnosis not present

## 2018-06-15 DIAGNOSIS — J189 Pneumonia, unspecified organism: Secondary | ICD-10-CM | POA: Diagnosis not present

## 2018-06-15 DIAGNOSIS — I10 Essential (primary) hypertension: Secondary | ICD-10-CM | POA: Diagnosis not present

## 2018-06-15 DIAGNOSIS — C3492 Malignant neoplasm of unspecified part of left bronchus or lung: Secondary | ICD-10-CM | POA: Diagnosis not present

## 2018-06-15 DIAGNOSIS — Z9981 Dependence on supplemental oxygen: Secondary | ICD-10-CM | POA: Diagnosis not present

## 2018-06-15 DIAGNOSIS — E785 Hyperlipidemia, unspecified: Secondary | ICD-10-CM | POA: Diagnosis not present

## 2018-06-15 DIAGNOSIS — J439 Emphysema, unspecified: Secondary | ICD-10-CM | POA: Diagnosis not present

## 2018-06-15 DIAGNOSIS — J40 Bronchitis, not specified as acute or chronic: Secondary | ICD-10-CM | POA: Diagnosis not present

## 2018-06-18 ENCOUNTER — Ambulatory Visit: Payer: Medicare Other | Admitting: Internal Medicine

## 2018-06-18 ENCOUNTER — Encounter: Payer: Self-pay | Admitting: Internal Medicine

## 2018-06-18 VITALS — BP 158/62 | HR 94 | Resp 18 | Ht <= 58 in | Wt 112.0 lb

## 2018-06-18 DIAGNOSIS — J441 Chronic obstructive pulmonary disease with (acute) exacerbation: Secondary | ICD-10-CM

## 2018-06-18 DIAGNOSIS — K219 Gastro-esophageal reflux disease without esophagitis: Secondary | ICD-10-CM | POA: Diagnosis not present

## 2018-06-18 DIAGNOSIS — J189 Pneumonia, unspecified organism: Secondary | ICD-10-CM

## 2018-06-18 DIAGNOSIS — N390 Urinary tract infection, site not specified: Secondary | ICD-10-CM | POA: Diagnosis not present

## 2018-06-18 DIAGNOSIS — J181 Lobar pneumonia, unspecified organism: Secondary | ICD-10-CM | POA: Diagnosis not present

## 2018-06-18 DIAGNOSIS — C3412 Malignant neoplasm of upper lobe, left bronchus or lung: Secondary | ICD-10-CM

## 2018-06-18 NOTE — Patient Instructions (Signed)

## 2018-06-18 NOTE — Progress Notes (Signed)
[facility] [place], [place] [address]  Pulmonary Sleep Medicine   Office Visit Note  Patient Name: Bethany Lambert DOB: January 28, 1934 MRN [id number]  Date of Service: 06/18/2018  Complaints/HPI: She is here for follow-up from the hospital.  Patient was seen in the ED chest x-ray was done this had shown presence of pneumonia.  Patient was given antibiotics.  She is now feeling better.  She had a follow-up chest x-ray done on the 30th of June which actually showed improvement.  On review of her CT scan she had a upper lobe pneumonia she did not have a follow-up CT scan.  There is concern since she has a history of lung cancer whether there may be some underlying  recurrence of a spiculated nodule in so therefore I have further imaging is probably indicated at this stage.  She also was told she had a urinary tract infection.  This has been treated and she seems to be actually doing better.  She does need to have off follow-up with her primary care physician which is apparently scheduled for tomorrow  ROS  General: (-) fever, (-) chills, (-) night sweats, (-) weakness Skin: (-) rashes, (-) itching,. Eyes: (-) visual changes, (-) redness, (-) itching. Nose and Sinuses: (-) nasal stuffiness or itchiness, (-) postnasal drip, (-) nosebleeds, (-) sinus trouble. Mouth and Throat: (-) sore throat, (-) hoarseness. Neck: (-) swollen glands, (-) enlarged thyroid, (-) neck pain. Respiratory: + cough, (-) bloody sputum, + shortness of breath, + wheezing. Cardiovascular: - ankle swelling, (-) chest pain. Lymphatic: (-) lymph node enlargement. Neurologic: (-) numbness, (-) tingling. Psychiatric: (-) anxiety, (-) depression   Current Medication: Outpatient Encounter Medications as of 06/18/2018  Medication Sig  . albuterol (PROVENTIL HFA;VENTOLIN HFA) 108 (90 Base) MCG/ACT inhaler Inhale 2 puffs into the lungs every 6 (six) hours as needed for wheezing.  . Cyanocobalamin  (VITAMIN B-12 IJ) Inject 1,000 mcg as directed every 30 (thirty) days.   Marland Kitchen diltiazem (DILACOR XR) 120 MG 24 hr capsule Take 120 mg by mouth daily.  Marland Kitchen docusate sodium (COLACE) 100 MG capsule Take 100-200 mg by mouth 2 (two) times daily. 100 mg in the morning and 200 mg at bedtime  . fluocinonide (LIDEX) 0.05 % external solution Apply 1 application topically 2 (two) times daily.   Marland Kitchen ipratropium-albuterol (DUONEB) 0.5-2.5 (3) MG/3ML SOLN Take 3 mLs by nebulization every 6 (six) hours as needed (wheezing).   Marland Kitchen loratadine (CLARITIN) 10 MG tablet Take 1 tablet (10 mg total) by mouth daily.  . metoprolol (LOPRESSOR) 50 MG tablet Take 1 tablet (50 mg total) by mouth 2 (two) times daily.  Marland Kitchen omeprazole (PRILOSEC) 20 MG capsule Take 20 mg by mouth 2 (two) times daily before a meal.   . OXYGEN Inhale 3 L into the lungs continuous.   . potassium chloride (K-DUR,KLOR-CON) 10 MEQ tablet Take 10 mEq by mouth daily.  . sodium chloride (OCEAN) 0.65 % SOLN nasal spray Place 1 spray into both nostrils as needed for congestion.  . SYMBICORT 80-4.5 MCG/ACT inhaler USE TWO PUFFS TWO TIMES A DAY  . SYMBICORT 80-4.5 MCG/ACT inhaler USE TWO PUFFS TWO TIMES A DAY  . tiotropium (SPIRIVA) 18 MCG inhalation capsule Place 18 mcg into inhaler and inhale daily.  . traZODone (DESYREL) 50 MG tablet Take 1 tablet (50 mg total) by mouth at bedtime.  . cephALEXin (KEFLEX) 500 MG capsule Take 1 capsule (500 mg total) by mouth 2 (two) times daily. (Patient not  taking: Reported on 06/18/2018)   No facility-administered encounter medications on file as of 06/18/2018.     Surgical History: Past Surgical History:  Procedure Laterality Date  . CATARACT EXTRACTION    . COLON RESECTION    . CT GUIDED BIOPSY  ([facility] HX)  11/19/2012   lung  . HERNIA REPAIR    . TOTAL ABDOMINAL HYSTERECTOMY      Medical History: Past Medical History:  Diagnosis Date  . COPD (chronic obstructive pulmonary disease) (HCC)    on 2L home o2  .  Erythrocytosis   . GERD (gastroesophageal reflux disease)   . History of chemotherapy   . History of radiation therapy   . Hyperlipidemia   . Hypertension   . Kidney failure   . Lung cancer ([place])    squamous, stage 3 non small cell lung ca  . Polycythemia   . Psoriasis     Family History: Family History  Problem Relation Age of Onset  . Heart attack Father   . Colon cancer Father   . Stroke Mother   . Cancer Son     Social History: Social History   Socioeconomic History  . Marital status: Widowed    Spouse name: Not on file  . Number of children: Not on file  . Years of education: Not on file  . Highest education level: Not on file  Occupational History  . Not on file  Social Needs  . Financial resource strain: Not on file  . Food insecurity:    Worry: Not on file    Inability: Not on file  . Transportation needs:    Medical: Not on file    Non-medical: Not on file  Tobacco Use  . Smoking status: Former Smoker    Packs/day: 2.00    Years: 40.00    Pack years: 80.00    Types: Cigarettes  . Smokeless tobacco: Never Used  Substance and Sexual Activity  . Alcohol use: No    Alcohol/week: 0.0 oz  . Drug use: No  . Sexual activity: Not on file  Lifestyle  . Physical activity:    Days per week: Not on file    Minutes per session: Not on file  . Stress: Not on file  Relationships  . Social connections:    Talks on phone: Not on file    Gets together: Not on file    Attends religious service: Not on file    Active member of club or organization: Not on file    Attends meetings of clubs or organizations: Not on file    Relationship status: Not on file  . Intimate partner violence:    Fear of current or ex partner: Not on file    Emotionally abused: Not on file    Physically abused: Not on file    Forced sexual activity: Not on file  Other Topics Concern  . Not on file  Social History Narrative   Lives at home with her son.  Has a walker but mostly gets  around without it.    Vital Signs: Blood pressure (!) 158/62, pulse 94, resp. rate 18, height 4' 10" (1.473 m), weight 112 lb (50.8 kg), SpO2 98 %.  Examination: General Appearance: The patient is well-developed, well-nourished, and in no distress. Skin: Gross inspection of skin unremarkable. Head: normocephalic, no gross deformities. Eyes: no gross deformities noted. ENT: ears appear grossly normal no exudates. Neck: Supple. No thyromegaly. No LAD. Respiratory: scattered rhonchi. Cardiovascular: Normal S1 and  S2 without murmur or rub. Extremities: No cyanosis. pulses are equal. Neurologic: Alert and oriented. No involuntary movements.  LABS: Recent Results (from the past 2160 hour(s))  Comprehensive metabolic panel     Status: Abnormal   Collection Time: 05/04/18  1:00 PM  Result Value Ref Range   Sodium 134 (L) 135 - 145 mmol/L   Potassium 3.8 3.5 - 5.1 mmol/L   Chloride 99 (L) 101 - 111 mmol/L   CO2 26 22 - 32 mmol/L   Glucose, Bld 138 (H) 65 - 99 mg/dL   BUN 14 6 - 20 mg/dL   Creatinine, Ser 0.82 0.44 - 1.00 mg/dL   Calcium 8.6 (L) 8.9 - 10.3 mg/dL   Total Protein 6.7 6.5 - 8.1 g/dL   Albumin 3.5 3.5 - 5.0 g/dL   AST 27 15 - 41 U/L   ALT 21 14 - 54 U/L   Alkaline Phosphatase 82 38 - 126 U/L   Total Bilirubin 0.9 0.3 - 1.2 mg/dL   GFR calc non Af Amer >60 >60 mL/min   GFR calc Af Amer >60 >60 mL/min    Comment: (NOTE) The eGFR has been calculated using the CKD EPI equation. This calculation has not been validated in all clinical situations. eGFR's persistently <60 mL/min signify possible Chronic Kidney Disease.    Anion gap 9 5 - 15    Comment: Performed at [facility], [address] [place], [place] [address]  CBC with Differential     Status: Abnormal   Collection Time: 05/04/18  1:00 PM  Result Value Ref Range   WBC 9.5 3.6 - 11.0 K/uL   RBC 3.52 (L) 3.80 - 5.20 MIL/uL   Hemoglobin 11.3 (L) 12.0 - 16.0 g/dL   HCT 32.6 (L) 35.0 - 47.0 %   MCV  92.8 80.0 - 100.0 fL   MCH 32.1 26.0 - 34.0 pg   MCHC 34.6 32.0 - 36.0 g/dL   RDW 13.7 11.5 - 14.5 %   Platelets 190 150 - 440 K/uL   Neutrophils Relative % 87 %   Neutro Abs 8.3 (H) 1.4 - 6.5 K/uL   Lymphocytes Relative 6 %   Lymphs Abs 0.6 (L) 1.0 - 3.6 K/uL   Monocytes Relative 6 %   Monocytes Absolute 0.6 0.2 - 0.9 K/uL   Eosinophils Relative 0 %   Eosinophils Absolute 0.0 0 - 0.7 K/uL   Basophils Relative 1 %   Basophils Absolute 0.1 0 - 0.1 K/uL    Comment: Performed at [facility], [address] [place], [place] [address]  Troponin I     Status: None   Collection Time: 05/04/18  1:00 PM  Result Value Ref Range   Troponin I <0.03 <0.03 ng/mL    Comment: Performed at [facility], [address] [place], [place] [address]  Urinalysis, Complete w Microscopic     Status: Abnormal   Collection Time: 05/04/18  1:01 PM  Result Value Ref Range   Color, Urine YELLOW (A) YELLOW   APPearance HAZY (A) CLEAR   Specific Gravity, Urine 1.014 1.005 - 1.030   pH 6.0 5.0 - 8.0   Glucose, UA NEGATIVE NEGATIVE mg/dL   Hgb urine dipstick NEGATIVE NEGATIVE   Bilirubin Urine NEGATIVE NEGATIVE   Ketones, ur NEGATIVE NEGATIVE mg/dL   Protein, ur NEGATIVE NEGATIVE mg/dL   Nitrite NEGATIVE NEGATIVE   Leukocytes, UA TRACE (A) NEGATIVE   RBC / HPF 0-5 0 - 5 RBC/hpf   WBC, UA  0-5 0 - 5 WBC/hpf   Bacteria, UA NONE SEEN NONE SEEN   Squamous Epithelial / LPF 0-5 0 - 5   Mucus PRESENT     Comment: Performed at [facility], [address] [place], [place] [address]  Lactic acid, plasma     Status: None   Collection Time: 05/04/18  1:01 PM  Result Value Ref Range   Lactic Acid, Venous 1.2 0.5 - 1.9 mmol/L    Comment: Performed at [facility], [address] [place], [place] [address]  Fibrin derivatives D-Dimer     Status: Abnormal   Collection Time: 05/04/18  4:11 PM  Result Value Ref Range   Fibrin derivatives D-dimer ([facility]) 994.55 (H) 0.00 -  499.00 ng/mL (FEU)    Comment: (NOTE) <> Exclusion of Venous Thromboembolism (VTE) - OUTPATIENT ONLY   (Emergency Department or [place])   0-499 ng/ml (FEU): With a low to intermediate pretest probability                      for VTE this test result excludes the diagnosis                      of VTE.   >499 ng/ml (FEU) : VTE not excluded; additional work up for VTE is                      required. <> Testing on Inpatients and Evaluation of Disseminated Intravascular   Coagulation (DIC) Reference Range:   0-499 ng/ml (FEU) Performed at [facility], [address] [place], [place] [address]   CBC with Differential     Status: Abnormal   Collection Time: 05/18/18  4:31 PM  Result Value Ref Range   WBC 8.1 3.6 - 11.0 K/uL   RBC 3.00 (L) 3.80 - 5.20 MIL/uL   Hemoglobin 9.6 (L) 12.0 - 16.0 g/dL   HCT 27.5 (L) 35.0 - 47.0 %   MCV 91.8 80.0 - 100.0 fL   MCH 31.9 26.0 - 34.0 pg   MCHC 34.8 32.0 - 36.0 g/dL   RDW 12.9 11.5 - 14.5 %   Platelets 397 150 - 440 K/uL   Neutrophils Relative % 79 %   Neutro Abs 6.4 1.4 - 6.5 K/uL   Lymphocytes Relative 12 %   Lymphs Abs 0.9 (L) 1.0 - 3.6 K/uL   Monocytes Relative 8 %   Monocytes Absolute 0.6 0.2 - 0.9 K/uL   Eosinophils Relative 1 %   Eosinophils Absolute 0.1 0 - 0.7 K/uL   Basophils Relative 0 %   Basophils Absolute 0.0 0 - 0.1 K/uL    Comment: Performed at [facility], [address] [place], [place] [address]  Basic metabolic panel     Status: Abnormal   Collection Time: 05/18/18  4:31 PM  Result Value Ref Range   Sodium 128 (L) 135 - 145 mmol/L   Potassium 4.1 3.5 - 5.1 mmol/L   Chloride 90 (L) 101 - 111 mmol/L   CO2 26 22 - 32 mmol/L   Glucose, Bld 124 (H) 65 - 99 mg/dL   BUN 11 6 - 20 mg/dL   Creatinine, Ser 0.89 0.44 - 1.00 mg/dL   Calcium 8.8 (L) 8.9 - 10.3 mg/dL   GFR calc non Af Amer 59 (L) >60 mL/min   GFR calc Af Amer >60 >60 mL/min    Comment: (NOTE) The eGFR has been calculated using the  CKD  EPI equation. This calculation has not been validated in all clinical situations. eGFR's persistently <60 mL/min signify possible Chronic Kidney Disease.    Anion gap 12 5 - 15    Comment: Performed at [facility], [address] [place], [place] [address]  Hepatic function panel     Status: Abnormal   Collection Time: 05/18/18  4:31 PM  Result Value Ref Range   Total Protein 6.6 6.5 - 8.1 g/dL   Albumin 2.8 (L) 3.5 - 5.0 g/dL   AST 20 15 - 41 U/L   ALT 17 14 - 54 U/L   Alkaline Phosphatase 60 38 - 126 U/L   Total Bilirubin 0.5 0.3 - 1.2 mg/dL   Bilirubin, Direct <0.1 (L) 0.1 - 0.5 mg/dL   Indirect Bilirubin NOT CALCULATED 0.3 - 0.9 mg/dL    Comment: Performed at [facility], [address] [place], [place] [address]  Lipase, blood     Status: None   Collection Time: 05/18/18  4:31 PM  Result Value Ref Range   Lipase 34 11 - 51 U/L    Comment: Performed at [facility], [address] [place], [place] [address]  Lactic acid, plasma     Status: None   Collection Time: 05/18/18  6:17 PM  Result Value Ref Range   Lactic Acid, Venous 0.7 0.5 - 1.9 mmol/L    Comment: Performed at [facility], [address] [place], [place] [address]  MRSA PCR Screening     Status: None   Collection Time: 05/18/18 11:36 PM  Result Value Ref Range   MRSA by PCR NEGATIVE NEGATIVE    Comment:        The GeneXpert MRSA Assay (FDA approved for NASAL specimens only), is one component of a comprehensive MRSA colonization surveillance program. It is not intended to diagnose MRSA infection nor to guide or monitor treatment for MRSA infections. Performed at [facility], [address] [place], [place] [address]   CBC     Status: Abnormal   Collection Time: 05/19/18  5:02 AM  Result Value Ref Range   WBC 6.5 3.6 - 11.0 K/uL   RBC 2.60 (L) 3.80 - 5.20 MIL/uL   Hemoglobin 8.3 (L) 12.0 - 16.0 g/dL   HCT 23.6 (L) 35.0 - 47.0 %   MCV 90.9 80.0  - 100.0 fL   MCH 31.9 26.0 - 34.0 pg   MCHC 35.1 32.0 - 36.0 g/dL   RDW 12.9 11.5 - 14.5 %   Platelets 297 150 - 440 K/uL    Comment: Performed at [facility], [address] [place], [place] [address]  Basic metabolic panel     Status: Abnormal   Collection Time: 05/19/18  5:02 AM  Result Value Ref Range   Sodium 130 (L) 135 - 145 mmol/L   Potassium 3.7 3.5 - 5.1 mmol/L   Chloride 98 (L) 101 - 111 mmol/L   CO2 23 22 - 32 mmol/L   Glucose, Bld 76 65 - 99 mg/dL   BUN 8 6 - 20 mg/dL   Creatinine, Ser 0.82 0.44 - 1.00 mg/dL   Calcium 7.9 (L) 8.9 - 10.3 mg/dL   GFR calc non Af Amer >60 >60 mL/min   GFR calc Af Amer >60 >60 mL/min    Comment: (NOTE) The eGFR has been calculated using the CKD EPI equation. This calculation has not been validated in all clinical situations. eGFR's persistently <60 mL/min signify possible Chronic  Kidney Disease.    Anion gap 9 5 - 15    Comment: Performed at [facility], [address] [place], [place] [address]  Basic metabolic panel     Status: Abnormal   Collection Time: 05/20/18  5:09 AM  Result Value Ref Range   Sodium 132 (L) 135 - 145 mmol/L   Potassium 3.4 (L) 3.5 - 5.1 mmol/L   Chloride 100 (L) 101 - 111 mmol/L   CO2 23 22 - 32 mmol/L   Glucose, Bld 80 65 - 99 mg/dL   BUN 7 6 - 20 mg/dL   Creatinine, Ser 0.73 0.44 - 1.00 mg/dL   Calcium 8.2 (L) 8.9 - 10.3 mg/dL   GFR calc non Af Amer >60 >60 mL/min   GFR calc Af Amer >60 >60 mL/min    Comment: (NOTE) The eGFR has been calculated using the CKD EPI equation. This calculation has not been validated in all clinical situations. eGFR's persistently <60 mL/min signify possible Chronic Kidney Disease.    Anion gap 9 5 - 15    Comment: Performed at [facility], [address] [place], [place] [address]  Urine drugs of abuse scrn w alc, routine (Ref Lab)     Status: None   Collection Time: 05/31/18  3:24 AM  Result Value Ref Range   Amphetamines, Urine  Negative Cutoff=1000 ng/mL    Comment: Amphetamine test includes Amphetamine and Methamphetamine.   Barbiturate, Ur Negative Cutoff=300 ng/mL   Benzodiazepine Quant, Ur Negative Cutoff=300 ng/mL   Cannabinoid Quant, Ur Negative Cutoff=50 ng/mL   Cocaine (Metab.) Negative Cutoff=300 ng/mL   Opiate Quant, Ur Negative Cutoff=300 ng/mL    Comment: Opiate test includes Codeine and Morphine only.   Phencyclidine, Ur Negative Cutoff=25 ng/mL   Methadone Screen, Urine Negative Cutoff=300 ng/mL   Propoxyphene, Urine Negative Cutoff=300 ng/mL   Ethanol U, Quan Negative Cutoff=0.020 %    Comment: (NOTE) Performed At: [facility] [address] [place], [place] [id number] Avis Epley PhD [id number] Performed at [facility], [address] [place], [place] [address]   CBC with Differential/Platelet     Status: Abnormal   Collection Time: 05/31/18  3:16 PM  Result Value Ref Range   WBC 7.0 3.6 - 11.0 K/uL   RBC 3.42 (L) 3.80 - 5.20 MIL/uL   Hemoglobin 11.1 (L) 12.0 - 16.0 g/dL   HCT 30.3 (L) 35.0 - 47.0 %   MCV 88.7 80.0 - 100.0 fL   MCH 32.3 26.0 - 34.0 pg   MCHC 36.5 (H) 32.0 - 36.0 g/dL   RDW 13.5 11.5 - 14.5 %   Platelets 313 150 - 440 K/uL   Neutrophils Relative % 73 %   Neutro Abs 5.1 1.4 - 6.5 K/uL   Lymphocytes Relative 18 %   Lymphs Abs 1.3 1.0 - 3.6 K/uL   Monocytes Relative 8 %   Monocytes Absolute 0.6 0.2 - 0.9 K/uL   Eosinophils Relative 0 %   Eosinophils Absolute 0.0 0 - 0.7 K/uL   Basophils Relative 1 %   Basophils Absolute 0.0 0 - 0.1 K/uL    Comment: Performed at [facility], [address], [place], [place] [address]  Basic metabolic panel     Status: Abnormal   Collection Time: 05/31/18  3:16 PM  Result Value Ref Range   Sodium 117 (LL) 135 - 145 mmol/L    Comment: CRITICAL RESULT CALLED TO, READ BACK BY AND VERIFIED WITH [place] LOYE _0   05/31/18 MGP    Potassium 3.4 (L) 3.5 - 5.1 mmol/L   Chloride 81 (L) 98 - 111 mmol/L     Comment: Please note change in reference range.   CO2 25 22 - 32 mmol/L   Glucose, Bld 101 (H) 70 - 99 mg/dL    Comment: Please note change in reference range.   BUN 12 8 - 23 mg/dL    Comment: Please note change in reference range.   Creatinine, Ser 0.59 0.44 - 1.00 mg/dL   Calcium 8.4 (L) 8.9 - 10.3 mg/dL   GFR calc non Af Amer >60 >60 mL/min   GFR calc Af Amer >60 >60 mL/min    Comment: (NOTE) The eGFR has been calculated using the CKD EPI equation. This calculation has not been validated in all clinical situations. eGFR's persistently <60 mL/min signify possible Chronic Kidney Disease.    Anion gap 11 5 - 15    Comment: Performed at [facility], [address] [place], [place] [address]  Urinalysis, Complete w Microscopic     Status: Abnormal   Collection Time: 05/31/18  3:16 PM  Result Value Ref Range   Color, Urine YELLOW (A) YELLOW   APPearance CLEAR (A) CLEAR   Specific Gravity, Urine 1.012 1.005 - 1.030   pH 7.0 5.0 - 8.0   Glucose, UA NEGATIVE NEGATIVE mg/dL   Hgb urine dipstick SMALL (A) NEGATIVE   Bilirubin Urine NEGATIVE NEGATIVE   Ketones, ur NEGATIVE NEGATIVE mg/dL   Protein, ur 30 (A) NEGATIVE mg/dL   Nitrite NEGATIVE NEGATIVE   Leukocytes, UA NEGATIVE NEGATIVE   RBC / HPF 0-5 0 - 5 RBC/hpf   WBC, UA 0-5 0 - 5 WBC/hpf   Bacteria, UA RARE (A) NONE SEEN   Squamous Epithelial / LPF 0-5 0 - 5   Mucus PRESENT     Comment: Performed at [facility], [address] [place], [place] [address]  Ammonia     Status: Abnormal   Collection Time: 05/31/18  5:50 PM  Result Value Ref Range   Ammonia <9 (L) 9 - 35 umol/L    Comment: Performed at [facility], [address] [place], [place] [address]  RPR     Status: None   Collection Time: 05/31/18  5:50 PM  Result Value Ref Range   RPR Ser Ql Non Reactive Non Reactive    Comment: (NOTE) Performed At: [facility] [place], [place] [id number] Rush Farmer MD  [id number] Performed at [facility], [address] [place], [place] [address]   Prealbumin     Status: None   Collection Time: 05/31/18  5:50 PM  Result Value Ref Range   Prealbumin 20.0 18 - 38 mg/dL    Comment: Performed at [facility] [address], [place], [place] [address]  TSH     Status: None   Collection Time: 05/31/18  8:00 PM  Result Value Ref Range   TSH 0.690 0.350 - 4.500 uIU/mL    Comment: Performed by a 3rd Generation assay with a functional sensitivity of <=0.01 uIU/mL. Performed at [facility], [address] [place], [place] [address]   Vitamin B12     Status: Abnormal   Collection Time: 05/31/18  8:00 PM  Result Value Ref Range   Vitamin B-12 1,372 (H) 180 - 914 pg/mL    Comment: (NOTE) This assay is not validated for testing neonatal or myeloproliferative syndrome specimens for Vitamin B12 levels.  Performed at [facility], [place] [address] [place], [place] [address]   Prealbumin     Status: None   Collection Time: 05/31/18  8:00 PM  Result Value Ref Range   Prealbumin 20.0 18 - 38 mg/dL    Comment: Performed at [facility] [address] [place], [place] [address]  Basic metabolic panel     Status: Abnormal   Collection Time: 06/01/18  5:29 AM  Result Value Ref Range   Sodium 125 (L) 135 - 145 mmol/L    Comment: RESULTS VERIFIED BY REPEAT TESTING JJB   Potassium 3.6 3.5 - 5.1 mmol/L   Chloride 92 (L) 98 - 111 mmol/L    Comment: Please note change in reference range.   CO2 26 22 - 32 mmol/L   Glucose, Bld 91 70 - 99 mg/dL    Comment: Please note change in reference range.   BUN 8 8 - 23 mg/dL    Comment: Please note change in reference range.   Creatinine, Ser 0.55 0.44 - 1.00 mg/dL   Calcium 8.3 (L) 8.9 - 10.3 mg/dL   GFR calc non Af Amer >60 >60 mL/min   GFR calc Af Amer >60 >60 mL/min    Comment: (NOTE) The eGFR has been calculated using the CKD EPI equation. This calculation has not been  validated in all clinical situations. eGFR's persistently <60 mL/min signify possible Chronic Kidney Disease.    Anion gap 7 5 - 15    Comment: Performed at [facility], [address] [place], [place] [address]  TSH     Status: None   Collection Time: 06/01/18  5:29 AM  Result Value Ref Range   TSH 0.808 0.350 - 4.500 uIU/mL    Comment: Performed by a 3rd Generation assay with a functional sensitivity of <=0.01 uIU/mL. Performed at [facility], [address] [place], [place] [address]   Basic metabolic panel     Status: Abnormal   Collection Time: 06/02/18  4:15 AM  Result Value Ref Range   Sodium 131 (L) 135 - 145 mmol/L   Potassium 3.5 3.5 - 5.1 mmol/L   Chloride 101 98 - 111 mmol/L    Comment: Please note change in reference range.   CO2 24 22 - 32 mmol/L   Glucose, Bld 92 70 - 99 mg/dL    Comment: Please note change in reference range.   BUN 10 8 - 23 mg/dL    Comment: Please note change in reference range.   Creatinine, Ser 0.70 0.44 - 1.00 mg/dL   Calcium 8.5 (L) 8.9 - 10.3 mg/dL   GFR calc non Af Amer >60 >60 mL/min   GFR calc Af Amer >60 >60 mL/min    Comment: (NOTE) The eGFR has been calculated using the CKD EPI equation. This calculation has not been validated in all clinical situations. eGFR's persistently <60 mL/min signify possible Chronic Kidney Disease.    Anion gap 6 5 - 15    Comment: Performed at [facility], [address] [place], [place] [address]  Osmolality     Status: None   Collection Time: 06/02/18  3:10 PM  Result Value Ref Range   Osmolality 280 275 - 295 mOsm/kg    Comment: Performed at [facility], [place] [address] [place], [place] [address]  Basic metabolic panel     Status: Abnormal   Collection Time: 06/03/18  4:03 AM  Result Value Ref Range  Sodium 137 135 - 145 mmol/L   Potassium 4.3 3.5 - 5.1 mmol/L   Chloride 103 98 - 111 mmol/L    Comment: Please note change in reference range.   CO2 29  22 - 32 mmol/L   Glucose, Bld 87 70 - 99 mg/dL    Comment: Please note change in reference range.   BUN 9 8 - 23 mg/dL    Comment: Please note change in reference range.   Creatinine, Ser 0.59 0.44 - 1.00 mg/dL   Calcium 8.7 (L) 8.9 - 10.3 mg/dL   GFR calc non Af Amer >60 >60 mL/min   GFR calc Af Amer >60 >60 mL/min    Comment: (NOTE) The eGFR has been calculated using the CKD EPI equation. This calculation has not been validated in all clinical situations. eGFR's persistently <60 mL/min signify possible Chronic Kidney Disease.    Anion gap 5 5 - 15    Comment: Performed at [facility], [address] [place], [place] [address]    Radiology: Dg Pelvis 1-2 Views  Result Date: 06/11/2018 CLINICAL DATA:  84 year old female with a history of fall EXAM: PELVIS - 1-2 VIEW COMPARISON:  None. FINDINGS: Osteopenia. Visualized bony pelvic ring intact with no acute displaced fracture. No acute displaced fracture of the sacrum. Atherosclerotic calcifications. IMPRESSION: Osteopenia, with no evidence of acute displaced fracture of the sacrum or visualized pelvis. Atherosclerosis Electronically Signed   By: Corrie Mckusick D.O.   On: 06/11/2018 09:10   Dg Sacrum/coccyx  Result Date: 06/11/2018 CLINICAL DATA:  84 year old female with a history of fall EXAM: SACRUM AND COCCYX - 2+ VIEW COMPARISON:  MR 01/10/2018, CT 05/18/2018 FINDINGS: Bony pelvic ring appears intact with no acute fracture identified. Bilateral hips projects normally over the acetabula. Degenerative changes of the right greater than left hips. Degenerative changes of the lower lumbar spine. Proximal femurs without acute displaced fracture. IMPRESSION: Osteopenia, with no evidence of acute displaced fracture of sacrum or visualized pelvis. Hip degenerative changes. Atherosclerosis. Electronically Signed   By: Corrie Mckusick D.O.   On: 06/11/2018 09:52   Ct Head Wo Contrast  Result Date: 06/11/2018 CLINICAL DATA:  Ground level  fall this morning with occipital injury and headache. EXAM: CT HEAD WITHOUT CONTRAST TECHNIQUE: Contiguous axial images were obtained from the base of the skull through the vertex without intravenous contrast. COMPARISON:  05/31/2018 head CT. FINDINGS: Brain: No evidence of parenchymal hemorrhage or extra-axial fluid collection. No mass lesion, mass effect, or midline shift. No CT evidence of acute infarction. Nonspecific mild subcortical and periventricular white matter hypodensity, most in keeping with chronic small vessel ischemic change. Cerebral volume is age appropriate. No ventriculomegaly. Vascular: No acute abnormality. Skull: No evidence of calvarial fracture. Sinuses/Orbits: The visualized paranasal sinuses are essentially clear. Other:  The mastoid air cells are unopacified. IMPRESSION: 1. No evidence of acute intracranial abnormality. No evidence of calvarial fracture. 2. Mild chronic small vessel ischemic changes in the cerebral white matter. Electronically Signed   By: Ilona Sorrel M.D.   On: 06/11/2018 08:39    No results found.  Dg Chest 2 View  Result Date: 06/03/2018 CLINICAL DATA:  Former smoker, hx of lung CA 2013, COPD, Recent discharge and re-admission to hospital for weakness. EXAM: CHEST - 2 VIEW COMPARISON:  06/01/2018 FINDINGS: Stable post XRT changes in the left suprahilar region and medial right upper lung. Lungs otherwise clear. Heart size and mediastinal contours are within normal limits. Aortic Atherosclerosis (ICD10-170.0). No effusion.  No  pneumothorax. Vertebral endplate spurring at multiple levels in the mid thoracic spine. IMPRESSION: 1. Stable XRT and degenerative changes as above.  No acute disease. Electronically Signed   By: Lucrezia Europe M.D.   On: 06/03/2018 14:10   Dg Chest 2 View  Result Date: 06/01/2018 CLINICAL DATA:  Low sodium.  History of COPD. EXAM: CHEST - 2 VIEW COMPARISON:  05/18/2018 FINDINGS: Heart size is normal. There is chronic aortic  atherosclerosis. The right lung is clear except for suprahilar scarring. Left lung shows asymmetrically more pronounced suprahilar scarring. Some superimposed patchy infiltrate or atelectasis in that region is probably improved. No worsening or new findings. No significant bone abnormality. IMPRESSION: Radiographic improvement with less atelectasis/infiltrate in the left upper lobe. Suprahilar scarring bilaterally as seen previously, left more than right. Electronically Signed   By: Nelson Chimes M.D.   On: 06/01/2018 13:55   Dg Pelvis 1-2 Views  Result Date: 06/11/2018 CLINICAL DATA:  84 year old female with a history of fall EXAM: PELVIS - 1-2 VIEW COMPARISON:  None. FINDINGS: Osteopenia. Visualized bony pelvic ring intact with no acute displaced fracture. No acute displaced fracture of the sacrum. Atherosclerotic calcifications. IMPRESSION: Osteopenia, with no evidence of acute displaced fracture of the sacrum or visualized pelvis. Atherosclerosis Electronically Signed   By: Corrie Mckusick D.O.   On: 06/11/2018 09:10   Dg Sacrum/coccyx  Result Date: 06/11/2018 CLINICAL DATA:  84 year old female with a history of fall EXAM: SACRUM AND COCCYX - 2+ VIEW COMPARISON:  MR 01/10/2018, CT 05/18/2018 FINDINGS: Bony pelvic ring appears intact with no acute fracture identified. Bilateral hips projects normally over the acetabula. Degenerative changes of the right greater than left hips. Degenerative changes of the lower lumbar spine. Proximal femurs without acute displaced fracture. IMPRESSION: Osteopenia, with no evidence of acute displaced fracture of sacrum or visualized pelvis. Hip degenerative changes. Atherosclerosis. Electronically Signed   By: Corrie Mckusick D.O.   On: 06/11/2018 09:52   Ct Head Wo Contrast  Result Date: 06/11/2018 CLINICAL DATA:  Ground level fall this morning with occipital injury and headache. EXAM: CT HEAD WITHOUT CONTRAST TECHNIQUE: Contiguous axial images were obtained from the base of  the skull through the vertex without intravenous contrast. COMPARISON:  05/31/2018 head CT. FINDINGS: Brain: No evidence of parenchymal hemorrhage or extra-axial fluid collection. No mass lesion, mass effect, or midline shift. No CT evidence of acute infarction. Nonspecific mild subcortical and periventricular white matter hypodensity, most in keeping with chronic small vessel ischemic change. Cerebral volume is age appropriate. No ventriculomegaly. Vascular: No acute abnormality. Skull: No evidence of calvarial fracture. Sinuses/Orbits: The visualized paranasal sinuses are essentially clear. Other:  The mastoid air cells are unopacified. IMPRESSION: 1. No evidence of acute intracranial abnormality. No evidence of calvarial fracture. 2. Mild chronic small vessel ischemic changes in the cerebral white matter. Electronically Signed   By: Ilona Sorrel M.D.   On: 06/11/2018 08:39   Ct Head Wo Contrast  Result Date: 05/31/2018 CLINICAL DATA:  Altered mental status with generalized weakness. History of lung cancer. EXAM: CT HEAD WITHOUT CONTRAST TECHNIQUE: Contiguous axial images were obtained from the base of the skull through the vertex without intravenous contrast. COMPARISON:  08/13/2014 FINDINGS: Brain: Ventricles, cisterns and other CSF spaces are within normal. There is mild chronic ischemic microvascular disease and minimal age related atrophic change. Small old lacune infarct over the right lentiform nucleus. Vascular: No hyperdense vessel or unexpected calcification. Skull: Normal. Negative for fracture or focal lesion. Sinuses/Orbits: No acute finding. Other:  None. IMPRESSION: No acute findings. Mild chronic ischemic microvascular disease and age related atrophic change. 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 Electronically Signed   By: Marin Olp M.D.   On: 05/31/2018 16:37   Mr Brain Wo Contrast  Result Date: 06/01/2018 CLINICAL DATA:  84 y/o F; acute altered mental status and increased confusion. EXAM:  MRI HEAD WITHOUT CONTRAST TECHNIQUE: Multiplanar, multiecho pulse sequences of the brain and surrounding structures were obtained without intravenous contrast. COMPARISON:  05/31/2018 CT head. FINDINGS: Brain: No acute infarction, hemorrhage, hydrocephalus, extra-axial collection or mass lesion. Small chronic lacunar infarct within the right putamen. Several nonspecific foci of T2 FLAIR hyperintense signal abnormality in subcortical and periventricular white matter are compatible with mild chronic microvascular ischemic changes for age. Mild brain parenchymal volume loss. Vascular: Normal flow voids. Skull and upper cervical spine: Normal marrow signal. Sinuses/Orbits: Negative. Other: Bilateral intra-ocular lens replacement. IMPRESSION: 1. No acute intracranial abnormality identified. 2. Mild for age chronic microvascular ischemic changes and parenchymal volume loss of the brain. 3. Small chronic lacunar infarct in the right putamen. Electronically Signed   By: Kristine Garbe M.D.   On: 06/01/2018 01:04      Assessment and Plan: Patient Active Problem List   Diagnosis Date Noted  . Hyponatremia 05/31/2018  . Pneumonia 05/18/2018  . Pancreatic cyst 07/10/2017  . COPD exacerbation ([place]) 09/09/2016  . GERD (gastroesophageal reflux disease) 09/09/2016  . HTN (hypertension) 09/09/2016  . Cancer of upper lobe of left lung ([place]) 07/06/2016  . Tachycardia 02/03/2013  . Dyspnea 02/03/2013    1. Lobar Pneumonia treated with antibiotics I'm going to schedule her for a follow-up CT scan of the chest.  I would be concerned if there is any recurrence of the tumor.  I am encouraged by the fact that the last x-ray actually showed improvement. 2. Lung Cancer  Follow-up CT scan of the chest was ordered get this done in and see her back in the office for follow-up 3. COPD this has been stable.  She will continue using her oxygen.  The current levels appear to be adequate control bar 5  saturations. 4. GERD  Stable we will continue with current medications 5. UTI she is going to see her primary care.  She was apparently given Keflex in the ED for urinary tract infection.  She may be better served of with Bactrim since she is actually allergic to fluoroquinolones  General Counseling: I have discussed the findings of the evaluation and examination with Alfie.  I have also discussed any further diagnostic evaluation thatmay be needed or ordered today. Nohea verbalizes understanding of the findings of todays visit. We also reviewed her medications today and discussed drug interactions and side effects including but not limited excessive drowsiness and altered mental states. We also discussed that there is always a risk not just to her but also people around her. she has been encouraged to call the office with any questions or concerns that should arise related to todays visit.  Orders Placed This Encounter  Procedures  . CT CHEST NODULE FOLLOW UP LOW DOSE W/O    Standing Status:   Future    Standing Expiration Date:   08/20/2019    Order Specific Question:   ** REASON FOR EXAM (FREE TEXT)    Answer:   follow up pneumonia Lung cancer    Order Specific Question:   Preferred imaging location?    Answer:   [facility]    Order Specific Question:  Radiology Contrast Protocol - do NOT remove file path    Answer:   _0 [email or web address]    Time spent: 68mn  I have personally obtained a history, examined the patient, evaluated laboratory and imaging results, formulated the assessment and plan and placed orders.    SAllyne Gee MD FWatsonville Surgeons GroupPulmonary and Critical Care Sleep medicine

## 2018-06-22 DIAGNOSIS — Z9181 History of falling: Secondary | ICD-10-CM | POA: Diagnosis not present

## 2018-06-22 DIAGNOSIS — I10 Essential (primary) hypertension: Secondary | ICD-10-CM | POA: Diagnosis not present

## 2018-06-22 DIAGNOSIS — J189 Pneumonia, unspecified organism: Secondary | ICD-10-CM | POA: Diagnosis not present

## 2018-06-22 DIAGNOSIS — Z9981 Dependence on supplemental oxygen: Secondary | ICD-10-CM | POA: Diagnosis not present

## 2018-06-22 DIAGNOSIS — C3492 Malignant neoplasm of unspecified part of left bronchus or lung: Secondary | ICD-10-CM | POA: Diagnosis not present

## 2018-06-22 DIAGNOSIS — J40 Bronchitis, not specified as acute or chronic: Secondary | ICD-10-CM | POA: Diagnosis not present

## 2018-06-22 DIAGNOSIS — E785 Hyperlipidemia, unspecified: Secondary | ICD-10-CM | POA: Diagnosis not present

## 2018-06-22 DIAGNOSIS — J439 Emphysema, unspecified: Secondary | ICD-10-CM | POA: Diagnosis not present

## 2018-06-22 DIAGNOSIS — Z792 Long term (current) use of antibiotics: Secondary | ICD-10-CM | POA: Diagnosis not present

## 2018-06-26 DIAGNOSIS — J449 Chronic obstructive pulmonary disease, unspecified: Secondary | ICD-10-CM | POA: Diagnosis not present

## 2018-06-27 DIAGNOSIS — H8110 Benign paroxysmal vertigo, unspecified ear: Secondary | ICD-10-CM | POA: Diagnosis not present

## 2018-06-27 DIAGNOSIS — H8112 Benign paroxysmal vertigo, left ear: Secondary | ICD-10-CM | POA: Diagnosis not present

## 2018-06-27 DIAGNOSIS — H903 Sensorineural hearing loss, bilateral: Secondary | ICD-10-CM | POA: Diagnosis not present

## 2018-06-29 DIAGNOSIS — H8112 Benign paroxysmal vertigo, left ear: Secondary | ICD-10-CM | POA: Diagnosis not present

## 2018-07-04 ENCOUNTER — Ambulatory Visit
Admission: RE | Admit: 2018-07-04 | Discharge: 2018-07-04 | Disposition: A | Discharge status: Home / Self Care | Payer: Medicare Other | Source: Ambulatory Visit | Attending: Radiation Oncology | Admitting: Radiation Oncology

## 2018-07-04 DIAGNOSIS — K862 Cyst of pancreas: Secondary | ICD-10-CM | POA: Insufficient documentation

## 2018-07-04 DIAGNOSIS — I700 Atherosclerosis of aorta: Secondary | ICD-10-CM | POA: Diagnosis not present

## 2018-07-04 DIAGNOSIS — R918 Other nonspecific abnormal finding of lung field: Secondary | ICD-10-CM | POA: Diagnosis not present

## 2018-07-04 DIAGNOSIS — J432 Centrilobular emphysema: Secondary | ICD-10-CM | POA: Diagnosis not present

## 2018-07-04 DIAGNOSIS — J438 Other emphysema: Secondary | ICD-10-CM | POA: Diagnosis not present

## 2018-07-04 DIAGNOSIS — Z923 Personal history of irradiation: Secondary | ICD-10-CM | POA: Diagnosis not present

## 2018-07-04 DIAGNOSIS — C3412 Malignant neoplasm of upper lobe, left bronchus or lung: Secondary | ICD-10-CM | POA: Diagnosis not present

## 2018-07-04 DIAGNOSIS — Z5111 Encounter for antineoplastic chemotherapy: Secondary | ICD-10-CM | POA: Diagnosis not present

## 2018-07-04 MED ORDER — IOHEXOL 300 MG/ML  SOLN
75.0000 mL | Freq: Once | INTRAMUSCULAR | Status: AC | PRN
  Administered 2018-07-04: 75 mL via INTRAVENOUS

## 2018-07-06 DIAGNOSIS — H8112 Benign paroxysmal vertigo, left ear: Secondary | ICD-10-CM | POA: Diagnosis not present

## 2018-07-06 DIAGNOSIS — H8111 Benign paroxysmal vertigo, right ear: Secondary | ICD-10-CM | POA: Diagnosis not present

## 2018-07-06 DIAGNOSIS — R42 Dizziness and giddiness: Secondary | ICD-10-CM | POA: Diagnosis not present

## 2018-07-10 DIAGNOSIS — J449 Chronic obstructive pulmonary disease, unspecified: Secondary | ICD-10-CM | POA: Diagnosis not present

## 2018-07-11 ENCOUNTER — Ambulatory Visit: Payer: Medicare Other | Admitting: Internal Medicine

## 2018-07-11 ENCOUNTER — Ambulatory Visit: Payer: Medicare Other | Admitting: Radiation Oncology

## 2018-07-11 ENCOUNTER — Other Ambulatory Visit: Payer: Medicare Other

## 2018-07-12 ENCOUNTER — Ambulatory Visit: Payer: Self-pay | Admitting: Adult Health

## 2018-07-13 DIAGNOSIS — R42 Dizziness and giddiness: Secondary | ICD-10-CM | POA: Diagnosis not present

## 2018-07-13 DIAGNOSIS — H8111 Benign paroxysmal vertigo, right ear: Secondary | ICD-10-CM | POA: Diagnosis not present

## 2018-07-13 DIAGNOSIS — H8112 Benign paroxysmal vertigo, left ear: Secondary | ICD-10-CM | POA: Diagnosis not present

## 2018-07-23 ENCOUNTER — Inpatient Hospital Stay: Payer: Medicare Other | Attending: Internal Medicine

## 2018-07-23 ENCOUNTER — Inpatient Hospital Stay (HOSPITAL_BASED_OUTPATIENT_CLINIC_OR_DEPARTMENT_OTHER): Payer: Medicare Other | Admitting: Internal Medicine

## 2018-07-23 ENCOUNTER — Ambulatory Visit: Payer: Medicare Other | Admitting: Radiation Oncology

## 2018-07-23 ENCOUNTER — Encounter: Payer: Self-pay | Admitting: Internal Medicine

## 2018-07-23 VITALS — BP 124/52 | HR 99 | Temp 99.1°F | Resp 18 | Wt 106.0 lb

## 2018-07-23 DIAGNOSIS — R42 Dizziness and giddiness: Secondary | ICD-10-CM | POA: Diagnosis not present

## 2018-07-23 DIAGNOSIS — Z87891 Personal history of nicotine dependence: Secondary | ICD-10-CM | POA: Diagnosis not present

## 2018-07-23 DIAGNOSIS — Z923 Personal history of irradiation: Secondary | ICD-10-CM | POA: Diagnosis not present

## 2018-07-23 DIAGNOSIS — C3412 Malignant neoplasm of upper lobe, left bronchus or lung: Secondary | ICD-10-CM

## 2018-07-23 DIAGNOSIS — D649 Anemia, unspecified: Secondary | ICD-10-CM | POA: Insufficient documentation

## 2018-07-23 DIAGNOSIS — J449 Chronic obstructive pulmonary disease, unspecified: Secondary | ICD-10-CM | POA: Diagnosis not present

## 2018-07-23 DIAGNOSIS — I10 Essential (primary) hypertension: Secondary | ICD-10-CM | POA: Diagnosis not present

## 2018-07-23 LAB — COMPREHENSIVE METABOLIC PANEL
ALBUMIN: 3.4 g/dL — AB (ref 3.5–5.0)
ALT: 14 U/L (ref 0–44)
AST: 21 U/L (ref 15–41)
Alkaline Phosphatase: 84 U/L (ref 38–126)
Anion gap: 12 (ref 5–15)
BUN: 14 mg/dL (ref 8–23)
CHLORIDE: 100 mmol/L (ref 98–111)
CO2: 23 mmol/L (ref 22–32)
CREATININE: 0.94 mg/dL (ref 0.44–1.00)
Calcium: 8.9 mg/dL (ref 8.9–10.3)
GFR calc non Af Amer: 55 mL/min — ABNORMAL LOW (ref >60)
GLUCOSE: 110 mg/dL — AB (ref 70–99)
Potassium: 3.2 mmol/L — ABNORMAL LOW (ref 3.5–5.1)
SODIUM: 135 mmol/L (ref 135–145)
Total Bilirubin: 0.7 mg/dL (ref 0.3–1.2)
Total Protein: 6.5 g/dL (ref 6.5–8.1)

## 2018-07-23 LAB — CBC WITH DIFFERENTIAL/PLATELET
Basophils Absolute: 0 10*3/uL (ref 0–0.1)
Basophils Relative: 0 %
EOS ABS: 0 10*3/uL (ref 0–0.7)
Eosinophils Relative: 0 %
HCT: 31.7 % — ABNORMAL LOW (ref 35.0–47.0)
HEMOGLOBIN: 11 g/dL — AB (ref 12.0–16.0)
LYMPHS ABS: 0.7 10*3/uL — AB (ref 1.0–3.6)
Lymphocytes Relative: 8 %
MCH: 32.5 pg (ref 26.0–34.0)
MCHC: 34.6 g/dL (ref 32.0–36.0)
MCV: 94 fL (ref 80.0–100.0)
MONOS PCT: 9 %
Monocytes Absolute: 0.8 10*3/uL (ref 0.2–0.9)
NEUTROS PCT: 83 %
Neutro Abs: 7.6 10*3/uL — ABNORMAL HIGH (ref 1.4–6.5)
Platelets: 214 10*3/uL (ref 150–440)
RBC: 3.37 MIL/uL — ABNORMAL LOW (ref 3.80–5.20)
RDW: 14.3 % (ref 11.5–14.5)
WBC: 9.2 10*3/uL (ref 3.6–11.0)

## 2018-07-23 NOTE — Progress Notes (Signed)
[name]  Patient Care Team: Baxter Hire, [name]  Cancer Staging No matching staging information was found for the patient.   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  # COPD Home O2 2.5L -     Cancer of upper lobe of left lung (HCC)    INTERVAL HISTORY:  Bethany Lambert 84 y.o.  female pleasant patient above history of lung cancer/COPD pancreatic cysts is here for follow-up/the results of the restaging CAT scan.  Patient had episode of " pneumonia" for which she was evaluated in ER-and treated with antibiotics.  Currently patient is back to baseline respiratory status.  Patient continues to complain of chronic dizzy spells.  She has been evaluated by ENT.  She is also undergone physical therapy without any significant benefit.  Patient denies any worsening abdominal pain.  Denies any constipation diarrhea.  Review of Systems  Constitutional: Negative for chills, diaphoresis, fever, malaise/fatigue and weight loss.  HENT: Negative for nosebleeds and sore throat.   Eyes: Negative for double vision.  Respiratory: Positive for cough and shortness of breath. Negative for hemoptysis and sputum production.   Cardiovascular: Negative for chest pain, palpitations, orthopnea and leg swelling.  Gastrointestinal: Negative for abdominal pain, blood in stool, constipation, diarrhea, heartburn, melena, nausea and vomiting.  Genitourinary: Negative for dysuria, frequency and urgency.  Musculoskeletal: Positive for back pain and joint pain.  Skin: Negative.   Negative for itching and rash.  Neurological: Negative for dizziness, tingling, focal weakness, weakness and headaches.  Endo/Heme/Allergies: Does not bruise/bleed easily.  Psychiatric/Behavioral: Negative for depression. The patient is not nervous/anxious and does not have insomnia.       PAST MEDICAL HISTORY :  Past Medical History:  Diagnosis Date  . COPD (chronic obstructive pulmonary disease) (HCC)    on 2L home o2  . Erythrocytosis   . GERD (gastroesophageal reflux disease)   . History of chemotherapy   . History of radiation therapy   . Hyperlipidemia   . Hypertension   . Kidney failure   . Lung cancer ([place])    squamous, stage 3 non small cell lung ca  . Polycythemia   . Psoriasis     PAST SURGICAL HISTORY :   Past Surgical History:  Procedure Laterality Date  . CATARACT EXTRACTION    . COLON RESECTION    . CT GUIDED BIOPSY  ([facility] HX)  11/19/2012   lung  . HERNIA REPAIR    . TOTAL ABDOMINAL HYSTERECTOMY      FAMILY HISTORY :   Family History  Problem Relation Age of Onset  . Heart attack Father   . Colon cancer Father   . Stroke Mother   . Cancer Son     SOCIAL HISTORY:   Social History   Tobacco Use  . Smoking status: Former Smoker    Packs/day: 2.00    Years: 40.00    Pack years: 80.00    Types: Cigarettes  . Smokeless tobacco: Never Used  Substance Use Topics  . Alcohol use: No    Alcohol/week: 0.0  standard drinks  . Drug use: No    ALLERGIES:  is allergic to ciprofloxacin; fluocinolone; and norvasc [amlodipine besylate].  MEDICATIONS:  Current Outpatient Medications  Medication Sig Dispense Refill  . albuterol (PROVENTIL HFA;VENTOLIN HFA) 108 (90 Base) MCG/ACT inhaler Inhale 2 puffs into the lungs every 6 (six) hours as needed for wheezing. 2 Inhaler 5  . Cyanocobalamin (VITAMIN B-12 IJ) Inject 1,000 mcg as directed every 30 (thirty) days.     Marland Kitchen diltiazem (DILACOR XR) 120 MG 24 hr capsule Take 120 mg by mouth daily.    Marland Kitchen docusate sodium  (COLACE) 100 MG capsule Take 100-200 mg by mouth 2 (two) times daily. 100 mg in the morning and 200 mg at bedtime    . fluocinonide (LIDEX) 0.05 % external solution Apply 1 application topically 2 (two) times daily.     Marland Kitchen ipratropium-albuterol (DUONEB) 0.5-2.5 (3) MG/3ML SOLN Take 3 mLs by nebulization every 6 (six) hours as needed (wheezing).     Marland Kitchen loratadine (CLARITIN) 10 MG tablet Take 1 tablet (10 mg total) by mouth daily. 30 tablet 0  . metoprolol (LOPRESSOR) 50 MG tablet Take 1 tablet (50 mg total) by mouth 2 (two) times daily.    Marland Kitchen omeprazole (PRILOSEC) 20 MG capsule Take 20 mg by mouth 2 (two) times daily before a meal.     . OXYGEN Inhale 3 L into the lungs continuous.     . potassium chloride (K-DUR,KLOR-CON) 10 MEQ tablet Take 10 mEq by mouth daily.    . sodium chloride (OCEAN) 0.65 % SOLN nasal spray Place 1 spray into both nostrils as needed for congestion. 1 Bottle 0  . SYMBICORT 80-4.5 MCG/ACT inhaler USE TWO PUFFS TWO TIMES A DAY 20.4 Inhaler 1  . SYMBICORT 80-4.5 MCG/ACT inhaler USE TWO PUFFS TWO TIMES A DAY 10.2 Inhaler 1  . tiotropium (SPIRIVA) 18 MCG inhalation capsule Place 18 mcg into inhaler and inhale daily.    . traZODone (DESYREL) 50 MG tablet Take 1 tablet (50 mg total) by mouth at bedtime. 30 tablet 0  . cephALEXin (KEFLEX) 500 MG capsule Take 1 capsule (500 mg total) by mouth 2 (two) times daily. (Patient not taking: Reported on 06/18/2018) 14 capsule 0   No current facility-administered medications for this visit.     PHYSICAL EXAMINATION: ECOG PERFORMANCE STATUS: 1 - Symptomatic but completely ambulatory  BP (!) 124/52 (BP Location: Left Arm, Patient Position: Sitting)   Pulse 99   Temp 99.1 F (37.3 C) (Tympanic)   Resp 18   Wt 106 lb (48.1 kg)   BMI 22.15 kg/m   Filed Weights   07/23/18 1033  Weight: 106 lb (48.1 kg)    GENERAL: Well-nourished well-developed; Alert, no distress and comfortable.  Accompanied by family.  In a wheelchair O2 nasal  cannula. EYES: no pallor or icterus OROPHARYNX: no thrush or ulceration; NECK: supple; no lymph nodes felt. LYMPH:  no palpable lymphadenopathy in the axillary or inguinal regions LUNGS: Decreased breath sounds auscultation bilaterally. No wheeze or crackles HEART/CVS: regular rate & rhythm and no murmurs; No lower extremity edema ABDOMEN:abdomen soft, non-tender and normal bowel sounds. No hepatomegaly or splenomegaly.  Musculoskeletal:no cyanosis of digits and no clubbing  PSYCH: alert & oriented x 3 with fluent speech NEURO: no focal motor/sensory deficits SKIN:  no rashes or significant lesions    LABORATORY DATA:  I have reviewed the data as listed    Component Value Date/Time   NA 135 07/23/2018 1003   NA 136 12/29/2014  0827   K 3.2 (L) 07/23/2018 1003   K 4.0 12/29/2014 0827   CL 100 07/23/2018 1003   CL 102 12/29/2014 0827   CO2 23 07/23/2018 1003   CO2 28 12/29/2014 0827   GLUCOSE 110 (H) 07/23/2018 1003   GLUCOSE 115 (H) 12/29/2014 0827   BUN 14 07/23/2018 1003   BUN 15 12/29/2014 0827   CREATININE 0.94 07/23/2018 1003   CREATININE 1.23 12/29/2014 0827   CALCIUM 8.9 07/23/2018 1003   CALCIUM 8.8 12/29/2014 0827   PROT 6.5 07/23/2018 1003   PROT 6.3 (L) 12/01/2014 1355   ALBUMIN 3.4 (L) 07/23/2018 1003   ALBUMIN 3.2 (L) 12/01/2014 1355   AST 21 07/23/2018 1003   AST 14 (L) 12/01/2014 1355   ALT 14 07/23/2018 1003   ALT 22 12/01/2014 1355   ALKPHOS 84 07/23/2018 1003   ALKPHOS 100 12/01/2014 1355   BILITOT 0.7 07/23/2018 1003   BILITOT 0.3 12/01/2014 1355   GFRNONAA 55 (L) 07/23/2018 1003   GFRNONAA 45 (L) 12/29/2014 0827   GFRNONAA 42 (L) 08/13/2014 1020   GFRAA >60 07/23/2018 1003   GFRAA 54 (L) 12/29/2014 0827   GFRAA 49 (L) 08/13/2014 1020    No results found for: SPEP, UPEP  Lab Results  Component Value Date   WBC 9.2 07/23/2018   NEUTROABS 7.6 (H) 07/23/2018   HGB 11.0 (L) 07/23/2018   HCT 31.7 (L) 07/23/2018   MCV 94.0 07/23/2018   PLT  214 07/23/2018      Chemistry      Component Value Date/Time   NA 135 07/23/2018 1003   NA 136 12/29/2014 0827   K 3.2 (L) 07/23/2018 1003   K 4.0 12/29/2014 0827   CL 100 07/23/2018 1003   CL 102 12/29/2014 0827   CO2 23 07/23/2018 1003   CO2 28 12/29/2014 0827   BUN 14 07/23/2018 1003   BUN 15 12/29/2014 0827   CREATININE 0.94 07/23/2018 1003   CREATININE 1.23 12/29/2014 0827      Component Value Date/Time   CALCIUM 8.9 07/23/2018 1003   CALCIUM 8.8 12/29/2014 0827   ALKPHOS 84 07/23/2018 1003   ALKPHOS 100 12/01/2014 1355   AST 21 07/23/2018 1003   AST 14 (L) 12/01/2014 1355   ALT 14 07/23/2018 1003   ALT 22 12/01/2014 1355   BILITOT 0.7 07/23/2018 1003   BILITOT 0.3 12/01/2014 1355       RADIOGRAPHIC STUDIES: I have personally reviewed the radiological images as listed and agreed with the findings in the report. No results found.   ASSESSMENT & PLAN:  Cancer of upper lobe of left lung ([place]) # Right upper lobe lung nodule [11 x 8 mm] s/p SBRT in end of July 2017.  Clinically stable  # stage IIIA/T4-invading the mediastinum/squamous cell carcinoma of the left lung- status post chemoradiation 2014.  Stable.  #July 04, 2018 CT scan chest-stable right hilar/left hilar radiation changes.  Right middle lobe-ill-defined infiltrate/nodule noted.  Stable.  # Pancreatic cyst-February 2019 MRI abdomen shows multiple cysts in the pancreas-pseudocyst-not concerning for malignancy.  Recommend MRI in 2021 spring.   # vertigo-status post physical therapy not improved/worse.  June 2019 MRI brain noncontrast negative for any obvious malignancy.  Awaiting evaluation with neurologist.  # COPD- STABLE follows up with Dr.Khan. Recent pnuemoain- improbed.   # mild anemia- Hb 11- ? Etiology. Check iron studies.  Chronic.  Monitor for now.  # I reviewed the blood work- with the patient in detail; also  reviewed the imaging independently [as summarized above]; and with the patient in  detail.   # follow up in 6 months; will order scan in 12 months.   c; Dr.Johnston/ Dr.Khan   Orders Placed This Encounter  Procedures  . CBC with Differential/Platelet    Standing Status:   Future    Standing Expiration Date:   07/24/2019  . Basic metabolic panel    Standing Status:   Future    Standing Expiration Date:   07/24/2019   All questions were answered. The patient knows to call the clinic with any problems, questions or concerns.      Cammie Sickle, MD 07/24/2018 7:30 AM

## 2018-07-23 NOTE — Assessment & Plan Note (Addendum)
#   Right upper lobe lung nodule [11 x 8 mm] s/p SBRT in end of July 2017.  Clinically stable  # stage IIIA/T4-invading the mediastinum/squamous cell carcinoma of the left lung- status post chemoradiation 2014.  Stable.  #July 04, 2018 CT scan chest-stable right hilar/left hilar radiation changes.  Right middle lobe-ill-defined infiltrate/nodule noted.  Stable.  # Pancreatic cyst-February 2019 MRI abdomen shows multiple cysts in the pancreas-pseudocyst-not concerning for malignancy.  Recommend MRI in 2021 spring.   # vertigo-status post physical therapy not improved/worse.  June 2019 MRI brain noncontrast negative for any obvious malignancy.  Awaiting evaluation with neurologist.  # COPD- STABLE follows up with Dr.Khan. Recent pnuemoain- improbed.   # mild anemia- Hb 11- ? Etiology. Check iron studies.  Chronic.  Monitor for now.  # I reviewed the blood work- with the patient in detail; also reviewed the imaging independently [as summarized above]; and with the patient in detail.   # follow up in 6 months; will order scan in 12 months.   c; Dr.Johnston/ Dr.Khan

## 2018-07-24 DIAGNOSIS — R42 Dizziness and giddiness: Secondary | ICD-10-CM | POA: Diagnosis not present

## 2018-07-24 DIAGNOSIS — R413 Other amnesia: Secondary | ICD-10-CM | POA: Diagnosis not present

## 2018-07-27 DIAGNOSIS — J449 Chronic obstructive pulmonary disease, unspecified: Secondary | ICD-10-CM | POA: Diagnosis not present

## 2018-08-01 ENCOUNTER — Encounter: Payer: Self-pay | Admitting: Radiation Oncology

## 2018-08-01 ENCOUNTER — Other Ambulatory Visit: Payer: Self-pay

## 2018-08-01 ENCOUNTER — Ambulatory Visit
Admission: RE | Admit: 2018-08-01 | Discharge: 2018-08-01 | Disposition: A | Discharge status: Home / Self Care | Payer: Medicare Other | Source: Ambulatory Visit | Attending: Radiation Oncology | Admitting: Radiation Oncology

## 2018-08-01 VITALS — BP 153/57 | HR 71 | Resp 18 | Wt 106.9 lb

## 2018-08-01 DIAGNOSIS — Z923 Personal history of irradiation: Secondary | ICD-10-CM | POA: Diagnosis not present

## 2018-08-01 DIAGNOSIS — C3411 Malignant neoplasm of upper lobe, right bronchus or lung: Secondary | ICD-10-CM

## 2018-08-01 DIAGNOSIS — Z9221 Personal history of antineoplastic chemotherapy: Secondary | ICD-10-CM | POA: Insufficient documentation

## 2018-08-01 DIAGNOSIS — Z85118 Personal history of other malignant neoplasm of bronchus and lung: Secondary | ICD-10-CM | POA: Diagnosis not present

## 2018-08-01 DIAGNOSIS — R918 Other nonspecific abnormal finding of lung field: Secondary | ICD-10-CM | POA: Diagnosis not present

## 2018-08-01 DIAGNOSIS — Z87891 Personal history of nicotine dependence: Secondary | ICD-10-CM | POA: Insufficient documentation

## 2018-08-01 NOTE — Progress Notes (Signed)
Radiation Oncology Follow up Note  Name: Bethany Lambert   Date:   08/01/2018 MRN:  [id number] DOB: 1934-02-22    This 84 y.o. female presents to the clinic today for to year follow-up status post.SB RT to right upper lobe for non-small cell lung cancer  REFERRING PROVIDER: Leonel Ramsay, MD  HPI: patient is an 84 year old female now seen out 2 years having completed SB RT to her right upper lobe for a non-small cell lung cancer. Seen today in routine follow-up she is doing fair. She is on nasal oxygencontinuously. She specifically denies hemoptysis. She does state has intermittent nonproductive cough and some dyspnea on exertion.her most recent CT scan performed in July shows no finding suspicious for local recurrence or metastatic disease. 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  COMPLICATIONS OF TREATMENT: none  FOLLOW UP COMPLIANCE: keeps appointments   PHYSICAL EXAM:  BP (!) 153/57 (BP Location: Left Arm, Patient Position: Sitting)   Pulse 71   Resp 18   Wt 106 lb 14.8 oz (48.5 kg)   BMI 22.35 kg/m  Frail wheelchair-bound female on nasal oxygen in NAD.Well-developed well-nourished patient in NAD. HEENT reveals PERLA, EOMI, discs not visualized.  Oral cavity is clear. No oral mucosal lesions are identified. Neck is clear without evidence of cervical or supraclavicular adenopathy. Lungs are clear to A&P. Cardiac examination is essentially unremarkable with regular rate and rhythm without murmur rub or thrill. Abdomen is benign with no organomegaly or masses noted. Motor sensory and DTR levels are equal and symmetric in the upper and lower extremities. Cranial nerves II through XII are grossly intact. Proprioception is intact. No peripheral adenopathy or edema is  identified. No motor or sensory levels are noted. Crude visual fields are within normal range.  RADIOLOGY RESULTS: CT scan is reviewed and compatible with the above-stated findings  PLAN: at the present time patient is doing well from a clinical standpoint with CT evidence showing no evidence of disease. I'm please were overall progress. She continues close follow-up care with medical oncology. I have asked to see her back in 1 year for follow-up. Patient is to call sooner with any concerns.  I would like to take this opportunity to thank you for allowing me to participate in the care of your patient.Noreene Filbert, MD

## 2018-08-03 ENCOUNTER — Ambulatory Visit: Payer: Medicare Other | Admitting: Adult Health

## 2018-08-03 ENCOUNTER — Encounter: Payer: Self-pay | Admitting: Adult Health

## 2018-08-03 VITALS — BP 142/62 | HR 72 | Temp 98.3°F | Resp 16 | Ht <= 58 in | Wt 107.0 lb

## 2018-08-03 DIAGNOSIS — I10 Essential (primary) hypertension: Secondary | ICD-10-CM | POA: Diagnosis not present

## 2018-08-03 DIAGNOSIS — K219 Gastro-esophageal reflux disease without esophagitis: Secondary | ICD-10-CM

## 2018-08-03 DIAGNOSIS — J441 Chronic obstructive pulmonary disease with (acute) exacerbation: Secondary | ICD-10-CM | POA: Diagnosis not present

## 2018-08-03 DIAGNOSIS — R0602 Shortness of breath: Secondary | ICD-10-CM | POA: Diagnosis not present

## 2018-08-03 MED ORDER — FLUTICASONE-UMECLIDIN-VILANT 100-62.5-25 MCG/INH IN AEPB
1.0000 | INHALATION_SPRAY | Freq: Every day | RESPIRATORY_TRACT | 1 refills | Status: DC
Start: 2018-08-03 — End: 2019-01-23

## 2018-08-03 NOTE — Progress Notes (Signed)
[facility] [place], [place] [address]  Pulmonary Sleep Medicine   Office Visit Note  Patient Name: Bethany Lambert DOB: 01-23-1934 MRN [id number]  Date of Service: 08/03/2018  Complaints/HPI: Pt here for acute visit with difficulty breathing.  She has a history of copd, gerd, and HTN.  She reports she has had intermittent times of increased SOB.  She is on home Oxygen 2-3 LPM continuously. She reports DOE as her main issue.  She is afraid during these times, that she is not going to get her breath back.  She describes a panicking feeling. In office today she is calm, on oxygen with minimal SOB, mostly with excessive speaking.    ROS  General: (-) fever, (-) chills, (-) night sweats, (-) weakness Skin: (-) rashes, (-) itching,. Eyes: (-) visual changes, (-) redness, (-) itching. Nose and Sinuses: (-) nasal stuffiness or itchiness, (-) postnasal drip, (-) nosebleeds, (-) sinus trouble. Mouth and Throat: (-) sore throat, (-) hoarseness. Neck: (-) swollen glands, (-) enlarged thyroid, (-) neck pain. Respiratory: - cough, (-) bloody sputum, + shortness of breath, - wheezing. Cardiovascular: - ankle swelling, (-) chest pain. Lymphatic: (-) lymph node enlargement. Neurologic: (-) numbness, (-) tingling. Psychiatric: (-) anxiety, (-) depression   Current Medication: Outpatient Encounter Medications as of 08/03/2018  Medication Sig  . albuterol (PROVENTIL HFA;VENTOLIN HFA) 108 (90 Base) MCG/ACT inhaler Inhale 2 puffs into the lungs every 6 (six) hours as needed for wheezing.  . Cyanocobalamin (VITAMIN B-12 IJ) Inject 1,000 mcg as directed every 30 (thirty) days.   Marland Kitchen diltiazem (CARDIZEM CD) 120 MG 24 hr capsule   . docusate sodium (COLACE) 100 MG capsule Take 100-200 mg by mouth 2 (two) times daily. 100 mg in the morning and 200 mg at bedtime  . fluocinonide (LIDEX) 0.05 % external solution Apply 1 application topically 2 (two) times daily.   Marland Kitchen  ipratropium-albuterol (DUONEB) 0.5-2.5 (3) MG/3ML SOLN Take 3 mLs by nebulization every 6 (six) hours as needed (wheezing).   Marland Kitchen loratadine (CLARITIN) 10 MG tablet Take 1 tablet (10 mg total) by mouth daily.  . metoprolol (LOPRESSOR) 50 MG tablet Take 1 tablet (50 mg total) by mouth 2 (two) times daily.  Marland Kitchen omeprazole (PRILOSEC) 20 MG capsule Take 20 mg by mouth 2 (two) times daily before a meal.   . OXYGEN Inhale 3 L into the lungs continuous.   . potassium chloride (K-DUR) 10 MEQ tablet   . Prenatal Vit-Fe Fumarate-FA (PRENATAL MULTIVITAMIN) TABS tablet Take 1 tablet by mouth daily at 12 noon.  . SYMBICORT 80-4.5 MCG/ACT inhaler USE TWO PUFFS TWO TIMES A DAY  . tiotropium (SPIRIVA) 18 MCG inhalation capsule Place 18 mcg into inhaler and inhale daily.  . traZODone (DESYREL) 50 MG tablet Take 1 tablet (50 mg total) by mouth at bedtime.  . [DISCONTINUED] calcium carbonate (OS-CAL) 1250 (500 Ca) MG chewable tablet Chew by mouth.  . [DISCONTINUED] diltiazem (DILACOR XR) 120 MG 24 hr capsule Take 120 mg by mouth daily.  . [DISCONTINUED] sodium chloride (OCEAN) 0.65 % SOLN nasal spray Place 1 spray into both nostrils as needed for congestion. (Patient not taking: Reported on 08/03/2018)   No facility-administered encounter medications on file as of 08/03/2018.     Surgical History: Past Surgical History:  Procedure Laterality Date  . CATARACT EXTRACTION    . COLON RESECTION    . CT GUIDED BIOPSY  ([facility] HX)  11/19/2012   lung  . HERNIA REPAIR    .  TOTAL ABDOMINAL HYSTERECTOMY      Medical History: Past Medical History:  Diagnosis Date  . COPD (chronic obstructive pulmonary disease) (HCC)    on 2L home o2  . Erythrocytosis   . GERD (gastroesophageal reflux disease)   . History of chemotherapy   . History of radiation therapy   . Hyperlipidemia   . Hypertension   . Kidney failure   . Lung cancer ([place])    squamous, stage 3 non small cell lung ca  . Polycythemia   . Psoriasis      Family History: Family History  Problem Relation Age of Onset  . Heart attack Father   . Colon cancer Father   . Stroke Mother   . Cancer Son     Social History: Social History   Socioeconomic History  . Marital status: Widowed    Spouse name: Not on file  . Number of children: Not on file  . Years of education: Not on file  . Highest education level: Not on file  Occupational History  . Not on file  Social Needs  . Financial resource strain: Not on file  . Food insecurity:    Worry: Not on file    Inability: Not on file  . Transportation needs:    Medical: Not on file    Non-medical: Not on file  Tobacco Use  . Smoking status: Former Smoker    Packs/day: 2.00    Years: 40.00    Pack years: 80.00    Types: Cigarettes  . Smokeless tobacco: Never Used  Substance and Sexual Activity  . Alcohol use: No    Alcohol/week: 0.0 standard drinks  . Drug use: No  . Sexual activity: Not on file  Lifestyle  . Physical activity:    Days per week: Not on file    Minutes per session: Not on file  . Stress: Not on file  Relationships  . Social connections:    Talks on phone: Not on file    Gets together: Not on file    Attends religious service: Not on file    Active member of club or organization: Not on file    Attends meetings of clubs or organizations: Not on file    Relationship status: Not on file  . Intimate partner violence:    Fear of current or ex partner: Not on file    Emotionally abused: Not on file    Physically abused: Not on file    Forced sexual activity: Not on file  Other Topics Concern  . Not on file  Social History Narrative   Lives at home with her son.  Has a walker but mostly gets around without it.    Vital Signs: Blood pressure (!) 142/62, pulse 72, temperature 98.3 F (36.8 C), resp. rate 16, height '4\' 10"'$  (1.473 m), weight 107 lb (48.5 kg), SpO2 98 %.  Examination: General Appearance: The patient is well-developed, well-nourished,  and in no distress. Skin: Gross inspection of skin unremarkable. Head: normocephalic, no gross deformities. Eyes: no gross deformities noted. ENT: ears appear grossly normal no exudates. Neck: Supple. No thyromegaly. No LAD. Respiratory: Clear to auscultation bilaterally. Cardiovascular: Normal S1 and S2 without murmur or rub. Extremities: No cyanosis. pulses are equal. Neurologic: Alert and oriented. No involuntary movements.  LABS: Recent Results (from the past 2160 hour(s))  CBC with Differential     Status: Abnormal   Collection Time: 05/18/18  4:31 PM  Result Value Ref Range   WBC  8.1 3.6 - 11.0 K/uL   RBC 3.00 (L) 3.80 - 5.20 MIL/uL   Hemoglobin 9.6 (L) 12.0 - 16.0 g/dL   HCT 27.5 (L) 35.0 - 47.0 %   MCV 91.8 80.0 - 100.0 fL   MCH 31.9 26.0 - 34.0 pg   MCHC 34.8 32.0 - 36.0 g/dL   RDW 12.9 11.5 - 14.5 %   Platelets 397 150 - 440 K/uL   Neutrophils Relative % 79 %   Neutro Abs 6.4 1.4 - 6.5 K/uL   Lymphocytes Relative 12 %   Lymphs Abs 0.9 (L) 1.0 - 3.6 K/uL   Monocytes Relative 8 %   Monocytes Absolute 0.6 0.2 - 0.9 K/uL   Eosinophils Relative 1 %   Eosinophils Absolute 0.1 0 - 0.7 K/uL   Basophils Relative 0 %   Basophils Absolute 0.0 0 - 0.1 K/uL    Comment: Performed at [facility], [address] [place], [place] [address]  Basic metabolic panel     Status: Abnormal   Collection Time: 05/18/18  4:31 PM  Result Value Ref Range   Sodium 128 (L) 135 - 145 mmol/L   Potassium 4.1 3.5 - 5.1 mmol/L   Chloride 90 (L) 101 - 111 mmol/L   CO2 26 22 - 32 mmol/L   Glucose, Bld 124 (H) 65 - 99 mg/dL   BUN 11 6 - 20 mg/dL   Creatinine, Ser 0.89 0.44 - 1.00 mg/dL   Calcium 8.8 (L) 8.9 - 10.3 mg/dL   GFR calc non Af Amer 59 (L) >60 mL/min   GFR calc Af Amer >60 >60 mL/min    Comment: (NOTE) The eGFR has been calculated using the CKD EPI equation. This calculation has not been validated in all clinical situations. eGFR's persistently <60 mL/min signify possible  Chronic Kidney Disease.    Anion gap 12 5 - 15    Comment: Performed at [facility], [address] [place], [place] [address]  Hepatic function panel     Status: Abnormal   Collection Time: 05/18/18  4:31 PM  Result Value Ref Range   Total Protein 6.6 6.5 - 8.1 g/dL   Albumin 2.8 (L) 3.5 - 5.0 g/dL   AST 20 15 - 41 U/L   ALT 17 14 - 54 U/L   Alkaline Phosphatase 60 38 - 126 U/L   Total Bilirubin 0.5 0.3 - 1.2 mg/dL   Bilirubin, Direct <0.1 (L) 0.1 - 0.5 mg/dL   Indirect Bilirubin NOT CALCULATED 0.3 - 0.9 mg/dL    Comment: Performed at [facility], [address] [place], [place] [address]  Lipase, blood     Status: None   Collection Time: 05/18/18  4:31 PM  Result Value Ref Range   Lipase 34 11 - 51 U/L    Comment: Performed at [facility], [address] [place], [place] [address]  Lactic acid, plasma     Status: None   Collection Time: 05/18/18  6:17 PM  Result Value Ref Range   Lactic Acid, Venous 0.7 0.5 - 1.9 mmol/L    Comment: Performed at [facility], [address] [place], [place] [address]  MRSA PCR Screening     Status: None   Collection Time: 05/18/18 11:36 PM  Result Value Ref Range   MRSA by PCR NEGATIVE NEGATIVE    Comment:        The GeneXpert MRSA Assay (FDA approved for NASAL specimens only), is one component of a comprehensive  MRSA colonization surveillance program. It is not intended to diagnose MRSA infection nor to guide or monitor treatment for MRSA infections. Performed at [facility], [address] [place], [place] [address]   CBC     Status: Abnormal   Collection Time: 05/19/18  5:02 AM  Result Value Ref Range   WBC 6.5 3.6 - 11.0 K/uL   RBC 2.60 (L) 3.80 - 5.20 MIL/uL   Hemoglobin 8.3 (L) 12.0 - 16.0 g/dL   HCT 23.6 (L) 35.0 - 47.0 %   MCV 90.9 80.0 - 100.0 fL   MCH 31.9 26.0 - 34.0 pg   MCHC 35.1 32.0 - 36.0 g/dL   RDW 12.9 11.5 - 14.5 %   Platelets 297 150 - 440 K/uL     Comment: Performed at [facility], [address], [place], [place] [address]  Basic metabolic panel     Status: Abnormal   Collection Time: 05/19/18  5:02 AM  Result Value Ref Range   Sodium 130 (L) 135 - 145 mmol/L   Potassium 3.7 3.5 - 5.1 mmol/L   Chloride 98 (L) 101 - 111 mmol/L   CO2 23 22 - 32 mmol/L   Glucose, Bld 76 65 - 99 mg/dL   BUN 8 6 - 20 mg/dL   Creatinine, Ser 0.82 0.44 - 1.00 mg/dL   Calcium 7.9 (L) 8.9 - 10.3 mg/dL   GFR calc non Af Amer >60 >60 mL/min   GFR calc Af Amer >60 >60 mL/min    Comment: (NOTE) The eGFR has been calculated using the CKD EPI equation. This calculation has not been validated in all clinical situations. eGFR's persistently <60 mL/min signify possible Chronic Kidney Disease.    Anion gap 9 5 - 15    Comment: Performed at [facility], [address] [place], [place] [address]  Basic metabolic panel     Status: Abnormal   Collection Time: 05/20/18  5:09 AM  Result Value Ref Range   Sodium 132 (L) 135 - 145 mmol/L   Potassium 3.4 (L) 3.5 - 5.1 mmol/L   Chloride 100 (L) 101 - 111 mmol/L   CO2 23 22 - 32 mmol/L   Glucose, Bld 80 65 - 99 mg/dL   BUN 7 6 - 20 mg/dL   Creatinine, Ser 0.73 0.44 - 1.00 mg/dL   Calcium 8.2 (L) 8.9 - 10.3 mg/dL   GFR calc non Af Amer >60 >60 mL/min   GFR calc Af Amer >60 >60 mL/min    Comment: (NOTE) The eGFR has been calculated using the CKD EPI equation. This calculation has not been validated in all clinical situations. eGFR's persistently <60 mL/min signify possible Chronic Kidney Disease.    Anion gap 9 5 - 15    Comment: Performed at [facility], [address] [place], [place] [address]  Urine drugs of abuse scrn w alc, routine (Ref Lab)     Status: None   Collection Time: 05/31/18  3:24 AM  Result Value Ref Range   Amphetamines, Urine Negative Cutoff=1000 ng/mL    Comment: Amphetamine test includes Amphetamine and Methamphetamine.   Barbiturate, Ur Negative  Cutoff=300 ng/mL   Benzodiazepine Quant, Ur Negative Cutoff=300 ng/mL   Cannabinoid Quant, Ur Negative Cutoff=50 ng/mL   Cocaine (Metab.) Negative Cutoff=300 ng/mL   Opiate Quant, Ur Negative Cutoff=300 ng/mL    Comment: Opiate test includes Codeine and Morphine only.   Phencyclidine, Ur Negative Cutoff=25 ng/mL   Methadone Screen, Urine Negative Cutoff=300 ng/mL  Propoxyphene, Urine Negative Cutoff=300 ng/mL   Ethanol U, Quan Negative Cutoff=0.020 %    Comment: (NOTE) Performed At: [facility] [address] [place], [place] [id number] Avis Epley PhD [id number] Performed at [facility], [address] [place], [place] [address]   CBC with Differential/Platelet     Status: Abnormal   Collection Time: 05/31/18  3:16 PM  Result Value Ref Range   WBC 7.0 3.6 - 11.0 K/uL   RBC 3.42 (L) 3.80 - 5.20 MIL/uL   Hemoglobin 11.1 (L) 12.0 - 16.0 g/dL   HCT 30.3 (L) 35.0 - 47.0 %   MCV 88.7 80.0 - 100.0 fL   MCH 32.3 26.0 - 34.0 pg   MCHC 36.5 (H) 32.0 - 36.0 g/dL   RDW 13.5 11.5 - 14.5 %   Platelets 313 150 - 440 K/uL   Neutrophils Relative % 73 %   Neutro Abs 5.1 1.4 - 6.5 K/uL   Lymphocytes Relative 18 %   Lymphs Abs 1.3 1.0 - 3.6 K/uL   Monocytes Relative 8 %   Monocytes Absolute 0.6 0.2 - 0.9 K/uL   Eosinophils Relative 0 %   Eosinophils Absolute 0.0 0 - 0.7 K/uL   Basophils Relative 1 %   Basophils Absolute 0.0 0 - 0.1 K/uL    Comment: Performed at [facility], [address] [place], [place] [address]  Basic metabolic panel     Status: Abnormal   Collection Time: 05/31/18  3:16 PM  Result Value Ref Range   Sodium 117 (LL) 135 - 145 mmol/L    Comment: CRITICAL RESULT CALLED TO, READ BACK BY AND VERIFIED WITH [place] LOYE '@[id number]'$  05/31/18 MGP    Potassium 3.4 (L) 3.5 - 5.1 mmol/L   Chloride 81 (L) 98 - 111 mmol/L    Comment: Please note change in reference range.   CO2 25 22 - 32 mmol/L   Glucose, Bld 101 (H) 70 - 99 mg/dL    Comment: Please  note change in reference range.   BUN 12 8 - 23 mg/dL    Comment: Please note change in reference range.   Creatinine, Ser 0.59 0.44 - 1.00 mg/dL   Calcium 8.4 (L) 8.9 - 10.3 mg/dL   GFR calc non Af Amer >60 >60 mL/min   GFR calc Af Amer >60 >60 mL/min    Comment: (NOTE) The eGFR has been calculated using the CKD EPI equation. This calculation has not been validated in all clinical situations. eGFR's persistently <60 mL/min signify possible Chronic Kidney Disease.    Anion gap 11 5 - 15    Comment: Performed at [facility], [address] [place], [place] [address]  Urinalysis, Complete w Microscopic     Status: Abnormal   Collection Time: 05/31/18  3:16 PM  Result Value Ref Range   Color, Urine YELLOW (A) YELLOW   APPearance CLEAR (A) CLEAR   Specific Gravity, Urine 1.012 1.005 - 1.030   pH 7.0 5.0 - 8.0   Glucose, UA NEGATIVE NEGATIVE mg/dL   Hgb urine dipstick SMALL (A) NEGATIVE   Bilirubin Urine NEGATIVE NEGATIVE   Ketones, ur NEGATIVE NEGATIVE mg/dL   Protein, ur 30 (A) NEGATIVE mg/dL   Nitrite NEGATIVE NEGATIVE   Leukocytes, UA NEGATIVE NEGATIVE   RBC / HPF 0-5 0 - 5 RBC/hpf   WBC, UA 0-5 0 - 5 WBC/hpf   Bacteria, UA RARE (A) NONE SEEN   Squamous Epithelial / LPF 0-5 0 - 5  Mucus PRESENT     Comment: Performed at [facility], [address] [place], [place] [address]  Ammonia     Status: Abnormal   Collection Time: 05/31/18  5:50 PM  Result Value Ref Range   Ammonia <9 (L) 9 - 35 umol/L    Comment: Performed at [facility], [address] [place], [place] [address]  RPR     Status: None   Collection Time: 05/31/18  5:50 PM  Result Value Ref Range   RPR Ser Ql Non Reactive Non Reactive    Comment: (NOTE) Performed At: [facility] [place], [place] [id number] Rush Farmer MD [id number] Performed at [facility], [address] [place], [place] [address]   Prealbumin     Status: None    Collection Time: 05/31/18  5:50 PM  Result Value Ref Range   Prealbumin 20.0 18 - 38 mg/dL    Comment: Performed at [facility], [address] [address], [place], [place] [address]  TSH     Status: None   Collection Time: 05/31/18  8:00 PM  Result Value Ref Range   TSH 0.690 0.350 - 4.500 uIU/mL    Comment: Performed by a 3rd Generation assay with a functional sensitivity of <=0.01 uIU/mL. Performed at [facility], [address] [place], [place] [address]   Vitamin B12     Status: Abnormal   Collection Time: 05/31/18  8:00 PM  Result Value Ref Range   Vitamin B-12 1,372 (H) 180 - 914 pg/mL    Comment: (NOTE) This assay is not validated for testing neonatal or myeloproliferative syndrome specimens for Vitamin B12 levels. Performed at [facility], [address] [place] [address] [place], [place] [address]   Prealbumin     Status: None   Collection Time: 05/31/18  8:00 PM  Result Value Ref Range   Prealbumin 20.0 18 - 38 mg/dL    Comment: Performed at [facility] [address] [place], [place] [address]  Basic metabolic panel     Status: Abnormal   Collection Time: 06/01/18  5:29 AM  Result Value Ref Range   Sodium 125 (L) 135 - 145 mmol/L    Comment: RESULTS VERIFIED BY REPEAT TESTING JJB   Potassium 3.6 3.5 - 5.1 mmol/L   Chloride 92 (L) 98 - 111 mmol/L    Comment: Please note change in reference range.   CO2 26 22 - 32 mmol/L   Glucose, Bld 91 70 - 99 mg/dL    Comment: Please note change in reference range.   BUN 8 8 - 23 mg/dL    Comment: Please note change in reference range.   Creatinine, Ser 0.55 0.44 - 1.00 mg/dL   Calcium 8.3 (L) 8.9 - 10.3 mg/dL   GFR calc non Af Amer >60 >60 mL/min   GFR calc Af Amer >60 >60 mL/min    Comment: (NOTE) The eGFR has been calculated using the CKD EPI equation. This calculation has not been validated in all clinical situations. eGFR's persistently <60 mL/min signify possible Chronic Kidney Disease.    Anion gap 7 5 - 15     Comment: Performed at [facility], [address] [place], [place] [address]  TSH     Status: None   Collection Time: 06/01/18  5:29 AM  Result Value Ref Range   TSH 0.808 0.350 - 4.500 uIU/mL    Comment: Performed by a 3rd Generation assay with a functional  sensitivity of <=0.01 uIU/mL. Performed at [facility], [address] [place], [place] [address]   Basic metabolic panel     Status: Abnormal   Collection Time: 06/02/18  4:15 AM  Result Value Ref Range   Sodium 131 (L) 135 - 145 mmol/L   Potassium 3.5 3.5 - 5.1 mmol/L   Chloride 101 98 - 111 mmol/L    Comment: Please note change in reference range.   CO2 24 22 - 32 mmol/L   Glucose, Bld 92 70 - 99 mg/dL    Comment: Please note change in reference range.   BUN 10 8 - 23 mg/dL    Comment: Please note change in reference range.   Creatinine, Ser 0.70 0.44 - 1.00 mg/dL   Calcium 8.5 (L) 8.9 - 10.3 mg/dL   GFR calc non Af Amer >60 >60 mL/min   GFR calc Af Amer >60 >60 mL/min    Comment: (NOTE) The eGFR has been calculated using the CKD EPI equation. This calculation has not been validated in all clinical situations. eGFR's persistently <60 mL/min signify possible Chronic Kidney Disease.    Anion gap 6 5 - 15    Comment: Performed at [facility], [address] [place], [place] [address]  Osmolality     Status: None   Collection Time: 06/02/18  3:10 PM  Result Value Ref Range   Osmolality 280 275 - 295 mOsm/kg    Comment: Performed at [facility], [place] [address] [place], [place] [address]  Basic metabolic panel     Status: Abnormal   Collection Time: 06/03/18  4:03 AM  Result Value Ref Range   Sodium 137 135 - 145 mmol/L   Potassium 4.3 3.5 - 5.1 mmol/L   Chloride 103 98 - 111 mmol/L    Comment: Please note change in reference range.   CO2 29 22 - 32 mmol/L   Glucose, Bld 87 70 - 99 mg/dL    Comment: Please note change in reference range.   BUN 9 8 - 23 mg/dL    Comment:  Please note change in reference range.   Creatinine, Ser 0.59 0.44 - 1.00 mg/dL   Calcium 8.7 (L) 8.9 - 10.3 mg/dL   GFR calc non Af Amer >60 >60 mL/min   GFR calc Af Amer >60 >60 mL/min    Comment: (NOTE) The eGFR has been calculated using the CKD EPI equation. This calculation has not been validated in all clinical situations. eGFR's persistently <60 mL/min signify possible Chronic Kidney Disease.    Anion gap 5 5 - 15    Comment: Performed at [facility], [address] [place], [place] [address]  Comprehensive metabolic panel     Status: Abnormal   Collection Time: 07/23/18 10:03 AM  Result Value Ref Range   Sodium 135 135 - 145 mmol/L   Potassium 3.2 (L) 3.5 - 5.1 mmol/L   Chloride 100 98 - 111 mmol/L   CO2 23 22 - 32 mmol/L   Glucose, Bld 110 (H) 70 - 99 mg/dL   BUN 14 8 - 23 mg/dL   Creatinine, Ser 0.94 0.44 - 1.00 mg/dL   Calcium 8.9 8.9 - 10.3 mg/dL   Total Protein 6.5 6.5 - 8.1 g/dL   Albumin 3.4 (L) 3.5 - 5.0 g/dL   AST 21 15 - 41 U/L   ALT 14 0 - 44 U/L   Alkaline Phosphatase 84 38 - 126 U/L   Total Bilirubin 0.7 0.3 -  1.2 mg/dL   GFR calc non Af Amer 55 (L) >60 mL/min   GFR calc Af Amer >60 >60 mL/min    Comment: (NOTE) The eGFR has been calculated using the CKD EPI equation. This calculation has not been validated in all clinical situations. eGFR's persistently <60 mL/min signify possible Chronic Kidney Disease.    Anion gap 12 5 - 15    Comment: Performed at [facility], [address] [place], [place] [address]  CBC with Differential/Platelet     Status: Abnormal   Collection Time: 07/23/18 10:03 AM  Result Value Ref Range   WBC 9.2 3.6 - 11.0 K/uL   RBC 3.37 (L) 3.80 - 5.20 MIL/uL   Hemoglobin 11.0 (L) 12.0 - 16.0 g/dL   HCT 31.7 (L) 35.0 - 47.0 %   MCV 94.0 80.0 - 100.0 fL   MCH 32.5 26.0 - 34.0 pg   MCHC 34.6 32.0 - 36.0 g/dL   RDW 14.3 11.5 - 14.5 %   Platelets 214 150 - 440 K/uL   Neutrophils Relative % 83 %   Neutro Abs  7.6 (H) 1.4 - 6.5 K/uL   Lymphocytes Relative 8 %   Lymphs Abs 0.7 (L) 1.0 - 3.6 K/uL   Monocytes Relative 9 %   Monocytes Absolute 0.8 0.2 - 0.9 K/uL   Eosinophils Relative 0 %   Eosinophils Absolute 0.0 0 - 0.7 K/uL   Basophils Relative 0 %   Basophils Absolute 0.0 0 - 0.1 K/uL    Comment: Performed at [facility], [address], [place], [place] [address]   Assessment and Plan: Patient Active Problem List   Diagnosis Date Noted  . Hyponatremia 05/31/2018  . Pneumonia 05/18/2018  . Pancreatic cyst 07/10/2017  . COPD exacerbation ([place]) 09/09/2016  . GERD (gastroesophageal reflux disease) 09/09/2016  . HTN (hypertension) 09/09/2016  . Cancer of upper lobe of left lung ([place]) 07/06/2016  . Tachycardia 02/03/2013  . Dyspnea 02/03/2013  ASSESSMENT/PLAN 1. SOB (shortness of breath) Mild shortness of breath noted in pt on oxygen.  She is concerned today about her DOE.    2. COPD exacerbation (HCC) STOP Symbicort and Spiriva, Start Trelegey.  Can continue nebulizer medications and proair. - Fluticasone-Umeclidin-Vilant (TRELEGY ELLIPTA) 100-62.5-25 MCG/INH AEPB; Inhale 1 Dose into the lungs daily.  Dispense: 60 each; Refill: 1  3. Gastroesophageal reflux disease without esophagitis Pt denies symptoms or worsening of GERD. Continue current therapy.   4. Hypertension, unspecified type Controlled at this time. She is advised to continue medication mangement.   General Counseling: I have discussed the findings of the evaluation and examination with Armonii.  I have also discussed any further diagnostic evaluation thatmay be needed or ordered today. Myracle verbalizes understanding of the findings of todays visit. We also reviewed her medications today and discussed drug interactions and side effects including but not limited excessive drowsiness and altered mental states. We also discussed that there is always a risk not just to her but also people around her. she has been  encouraged to call the office with any questions or concerns that should arise related to todays visit.    Time spent: 25 This patient was seen by Orson Gear AGNP-C in Collaboration with Dr. Devona Konig as a part of collaborative care agreement.   I have personally obtained a history, examined the patient, evaluated laboratory and imaging results, formulated the assessment and plan and placed orders.    Allyne Gee, MD [facility] Pulmonary and Critical Care  Sleep medicine

## 2018-08-03 NOTE — Patient Instructions (Signed)

## 2018-08-10 DIAGNOSIS — J449 Chronic obstructive pulmonary disease, unspecified: Secondary | ICD-10-CM | POA: Diagnosis not present

## 2018-08-21 DIAGNOSIS — R42 Dizziness and giddiness: Secondary | ICD-10-CM | POA: Diagnosis not present

## 2018-08-21 DIAGNOSIS — I4891 Unspecified atrial fibrillation: Secondary | ICD-10-CM | POA: Diagnosis not present

## 2018-08-21 DIAGNOSIS — I6529 Occlusion and stenosis of unspecified carotid artery: Secondary | ICD-10-CM | POA: Diagnosis not present

## 2018-08-27 DIAGNOSIS — R42 Dizziness and giddiness: Secondary | ICD-10-CM | POA: Diagnosis not present

## 2018-08-27 DIAGNOSIS — H8111 Benign paroxysmal vertigo, right ear: Secondary | ICD-10-CM | POA: Diagnosis not present

## 2018-08-27 DIAGNOSIS — H8112 Benign paroxysmal vertigo, left ear: Secondary | ICD-10-CM | POA: Diagnosis not present

## 2018-08-29 DIAGNOSIS — R42 Dizziness and giddiness: Secondary | ICD-10-CM | POA: Diagnosis not present

## 2018-08-29 DIAGNOSIS — I6523 Occlusion and stenosis of bilateral carotid arteries: Secondary | ICD-10-CM | POA: Diagnosis not present

## 2018-09-03 DIAGNOSIS — I10 Essential (primary) hypertension: Secondary | ICD-10-CM | POA: Diagnosis not present

## 2018-09-03 DIAGNOSIS — K219 Gastro-esophageal reflux disease without esophagitis: Secondary | ICD-10-CM | POA: Diagnosis not present

## 2018-09-03 DIAGNOSIS — J9611 Chronic respiratory failure with hypoxia: Secondary | ICD-10-CM | POA: Diagnosis not present

## 2018-09-03 DIAGNOSIS — C3490 Malignant neoplasm of unspecified part of unspecified bronchus or lung: Secondary | ICD-10-CM | POA: Diagnosis not present

## 2018-09-03 DIAGNOSIS — J439 Emphysema, unspecified: Secondary | ICD-10-CM | POA: Diagnosis not present

## 2018-09-06 DIAGNOSIS — I4891 Unspecified atrial fibrillation: Secondary | ICD-10-CM | POA: Diagnosis not present

## 2018-09-06 DIAGNOSIS — R42 Dizziness and giddiness: Secondary | ICD-10-CM | POA: Diagnosis not present

## 2018-09-09 DIAGNOSIS — J449 Chronic obstructive pulmonary disease, unspecified: Secondary | ICD-10-CM | POA: Diagnosis not present

## 2018-10-10 DIAGNOSIS — J449 Chronic obstructive pulmonary disease, unspecified: Secondary | ICD-10-CM | POA: Diagnosis not present

## 2018-10-15 ENCOUNTER — Ambulatory Visit: Payer: Medicare Other | Admitting: Internal Medicine

## 2018-10-15 ENCOUNTER — Encounter: Payer: Self-pay | Admitting: Internal Medicine

## 2018-10-15 VITALS — BP 138/62 | HR 82 | Resp 16 | Ht <= 58 in | Wt 115.0 lb

## 2018-10-15 DIAGNOSIS — J189 Pneumonia, unspecified organism: Secondary | ICD-10-CM

## 2018-10-15 DIAGNOSIS — J449 Chronic obstructive pulmonary disease, unspecified: Secondary | ICD-10-CM | POA: Diagnosis not present

## 2018-10-15 DIAGNOSIS — C3412 Malignant neoplasm of upper lobe, left bronchus or lung: Secondary | ICD-10-CM

## 2018-10-15 DIAGNOSIS — R0602 Shortness of breath: Secondary | ICD-10-CM

## 2018-10-15 DIAGNOSIS — Z9981 Dependence on supplemental oxygen: Secondary | ICD-10-CM

## 2018-10-15 DIAGNOSIS — J181 Lobar pneumonia, unspecified organism: Secondary | ICD-10-CM

## 2018-10-15 NOTE — Progress Notes (Signed)
[facility] [place], [place] [address]  Pulmonary Sleep Medicine   Office Visit Note  Patient Name: Bethany Lambert DOB: May 30, 1934 MRN [id number]  Date of Service: 10/15/2018  Complaints/HPI: She is doing fairly well.  She has been cancer free since her last CTs.  She is supposed to go back and see her oncologist.  Patient states that she has not had much in the way of cough or congestion.  She denies having any chest pain.  She has been having some issues with her nebulizer is not working as it should be.  Patient states that she is going to try to get a new Glass blower/designer and her prescription for that.  ROS  General: (-) fever, (-) chills, (-) night sweats, (-) weakness Skin: (-) rashes, (-) itching,. Eyes: (-) visual changes, (-) redness, (-) itching. Nose and Sinuses: (-) nasal stuffiness or itchiness, (-) postnasal drip, (-) nosebleeds, (-) sinus trouble. Mouth and Throat: (-) sore throat, (-) hoarseness. Neck: (-) swollen glands, (-) enlarged thyroid, (-) neck pain. Respiratory: - cough, (-) bloody sputum, + shortness of breath, - wheezing. Cardiovascular: - ankle swelling, (-) chest pain. Lymphatic: (-) lymph node enlargement. Neurologic: (-) numbness, (-) tingling. Psychiatric: (-) anxiety, (-) depression   Current Medication: Outpatient Encounter Medications as of 10/15/2018  Medication Sig  . albuterol (PROVENTIL HFA;VENTOLIN HFA) 108 (90 Base) MCG/ACT inhaler Inhale 2 puffs into the lungs every 6 (six) hours as needed for wheezing.  . Cyanocobalamin (VITAMIN B-12 IJ) Inject 1,000 mcg as directed every 30 (thirty) days.   Marland Kitchen diltiazem (CARDIZEM CD) 120 MG 24 hr capsule   . docusate sodium (COLACE) 100 MG capsule Take 100-200 mg by mouth 2 (two) times daily. 100 mg in the morning and 200 mg at bedtime  . fluocinonide (LIDEX) 0.05 % external solution Apply 1 application topically 2 (two) times daily.   . Fluticasone-Umeclidin-Vilant  (TRELEGY ELLIPTA) 100-62.5-25 MCG/INH AEPB Inhale 1 Dose into the lungs daily.  Marland Kitchen ipratropium-albuterol (DUONEB) 0.5-2.5 (3) MG/3ML SOLN Take 3 mLs by nebulization every 6 (six) hours as needed (wheezing).   Marland Kitchen loratadine (CLARITIN) 10 MG tablet Take 1 tablet (10 mg total) by mouth daily.  . metoprolol (LOPRESSOR) 50 MG tablet Take 1 tablet (50 mg total) by mouth 2 (two) times daily.  Marland Kitchen omeprazole (PRILOSEC) 20 MG capsule Take 20 mg by mouth 2 (two) times daily before a meal.   . OXYGEN Inhale 3 L into the lungs continuous.   . potassium chloride (K-DUR) 10 MEQ tablet   . Prenatal Vit-Fe Fumarate-FA (PRENATAL MULTIVITAMIN) TABS tablet Take 1 tablet by mouth daily at 12 noon.  . traZODone (DESYREL) 50 MG tablet Take 1 tablet (50 mg total) by mouth at bedtime.   No facility-administered encounter medications on file as of 10/15/2018.     Surgical History: Past Surgical History:  Procedure Laterality Date  . CATARACT EXTRACTION    . COLON RESECTION    . CT GUIDED BIOPSY  ([facility] HX)  11/19/2012   lung  . HERNIA REPAIR    . TOTAL ABDOMINAL HYSTERECTOMY      Medical History: Past Medical History:  Diagnosis Date  . COPD (chronic obstructive pulmonary disease) (HCC)    on 2L home o2  . Erythrocytosis   . GERD (gastroesophageal reflux disease)   . History of chemotherapy   . History of radiation therapy   . Hyperlipidemia   . Hypertension   . Kidney failure   . Lung  cancer ([place])    squamous, stage 3 non small cell lung ca  . Polycythemia   . Psoriasis     Family History: Family History  Problem Relation Age of Onset  . Heart attack Father   . Colon cancer Father   . Stroke Mother   . Cancer Son     Social History: Social History   Socioeconomic History  . Marital status: Widowed    Spouse name: Not on file  . Number of children: Not on file  . Years of education: Not on file  . Highest education level: Not on file  Occupational History  . Not on file  Social Needs   . Financial resource strain: Not on file  . Food insecurity:    Worry: Not on file    Inability: Not on file  . Transportation needs:    Medical: Not on file    Non-medical: Not on file  Tobacco Use  . Smoking status: Former Smoker    Packs/day: 2.00    Years: 40.00    Pack years: 80.00    Types: Cigarettes  . Smokeless tobacco: Never Used  Substance and Sexual Activity  . Alcohol use: No    Alcohol/week: 0.0 standard drinks  . Drug use: No  . Sexual activity: Not on file  Lifestyle  . Physical activity:    Days per week: Not on file    Minutes per session: Not on file  . Stress: Not on file  Relationships  . Social connections:    Talks on phone: Not on file    Gets together: Not on file    Attends religious service: Not on file    Active member of club or organization: Not on file    Attends meetings of clubs or organizations: Not on file    Relationship status: Not on file  . Intimate partner violence:    Fear of current or ex partner: Not on file    Emotionally abused: Not on file    Physically abused: Not on file    Forced sexual activity: Not on file  Other Topics Concern  . Not on file  Social History Narrative   Lives at home with her son.  Has a walker but mostly gets around without it.    Vital Signs: Blood pressure 138/62, pulse 82, resp. rate 16, height '4\' 10"'$  (1.473 m), weight 115 lb (52.2 kg), SpO2 99 %.  Examination: General Appearance: The patient is well-developed, well-nourished, and in no distress. Skin: Gross inspection of skin unremarkable. Head: normocephalic, no gross deformities. Eyes: no gross deformities noted. ENT: ears appear grossly normal no exudates. Neck: Supple. No thyromegaly. No LAD. Respiratory: no rhonchi noted. Cardiovascular: Normal S1 and S2 without murmur or rub. Extremities: No cyanosis. pulses are equal. Neurologic: Alert and oriented. No involuntary movements.  LABS: Recent Results (from the past 2160 hour(s))   Comprehensive metabolic panel     Status: Abnormal   Collection Time: 07/23/18 10:03 AM  Result Value Ref Range   Sodium 135 135 - 145 mmol/L   Potassium 3.2 (L) 3.5 - 5.1 mmol/L   Chloride 100 98 - 111 mmol/L   CO2 23 22 - 32 mmol/L   Glucose, Bld 110 (H) 70 - 99 mg/dL   BUN 14 8 - 23 mg/dL   Creatinine, Ser 0.94 0.44 - 1.00 mg/dL   Calcium 8.9 8.9 - 10.3 mg/dL   Total Protein 6.5 6.5 - 8.1 g/dL   Albumin 3.4 (L)  3.5 - 5.0 g/dL   AST 21 15 - 41 U/L   ALT 14 0 - 44 U/L   Alkaline Phosphatase 84 38 - 126 U/L   Total Bilirubin 0.7 0.3 - 1.2 mg/dL   GFR calc non Af Amer 55 (L) >60 mL/min   GFR calc Af Amer >60 >60 mL/min    Comment: (NOTE) The eGFR has been calculated using the CKD EPI equation. This calculation has not been validated in all clinical situations. eGFR's persistently <60 mL/min signify possible Chronic Kidney Disease.    Anion gap 12 5 - 15    Comment: Performed at [facility], [address] [place], [place] [address]  CBC with Differential/Platelet     Status: Abnormal   Collection Time: 07/23/18 10:03 AM  Result Value Ref Range   WBC 9.2 3.6 - 11.0 K/uL   RBC 3.37 (L) 3.80 - 5.20 MIL/uL   Hemoglobin 11.0 (L) 12.0 - 16.0 g/dL   HCT 31.7 (L) 35.0 - 47.0 %   MCV 94.0 80.0 - 100.0 fL   MCH 32.5 26.0 - 34.0 pg   MCHC 34.6 32.0 - 36.0 g/dL   RDW 14.3 11.5 - 14.5 %   Platelets 214 150 - 440 K/uL   Neutrophils Relative % 83 %   Neutro Abs 7.6 (H) 1.4 - 6.5 K/uL   Lymphocytes Relative 8 %   Lymphs Abs 0.7 (L) 1.0 - 3.6 K/uL   Monocytes Relative 9 %   Monocytes Absolute 0.8 0.2 - 0.9 K/uL   Eosinophils Relative 0 %   Eosinophils Absolute 0.0 0 - 0.7 K/uL   Basophils Relative 0 %   Basophils Absolute 0.0 0 - 0.1 K/uL    Comment: Performed at [facility], [address], [place], [place] [address]    Radiology: No results found.  No results found.  No results found.    Assessment and Plan: Patient Active Problem List   Diagnosis  Date Noted  . Hyponatremia 05/31/2018  . Pneumonia 05/18/2018  . Pancreatic cyst 07/10/2017  . COPD exacerbation ([place]) 09/09/2016  . GERD (gastroesophageal reflux disease) 09/09/2016  . HTN (hypertension) 09/09/2016  . Cancer of upper lobe of left lung ([place]) 07/06/2016  . Tachycardia 02/03/2013  . Dyspnea 02/03/2013    1. COPD she is doing fairly well she is been stable without any distress.  Patient has been tolerating her current medical management no admissions to the hospital.  She does have some issues with her teeth and needs to have been pulled.  She is supposed to see the dentist about that as far as surgery for this is concerned she would be high risk secondary to her underlying pulmonary disease.  She would be more beneficial to avoid general anesthesia if at all possible. 2. Lung Cancer last scan shows no resurgence of the tumor she will follow-up with oncology 3. Oxygen dependent she is on oxygen and gaining benefit from it in addition a prescription was written for the nebulizer 4. Pneumonia resolved  General Counseling: I have discussed the findings of the evaluation and examination with Abie.  I have also discussed any further diagnostic evaluation thatmay be needed or ordered today. Aashka verbalizes understanding of the findings of todays visit. We also reviewed her medications today and discussed drug interactions and side effects including but not limited excessive drowsiness and altered mental states. We also discussed that there is always a risk not just to her but also people around her. she has  been encouraged to call the office with any questions or concerns that should arise related to todays visit.    Time spent: 38mn  I have personally obtained a history, examined the patient, evaluated laboratory and imaging results, formulated the assessment and plan and placed orders.    SAllyne Gee MD [facility] Sleep [facility]

## 2018-10-15 NOTE — Patient Instructions (Signed)

## 2018-10-16 DIAGNOSIS — J9610 Chronic respiratory failure, unspecified whether with hypoxia or hypercapnia: Secondary | ICD-10-CM | POA: Diagnosis not present

## 2018-10-16 DIAGNOSIS — J449 Chronic obstructive pulmonary disease, unspecified: Secondary | ICD-10-CM | POA: Diagnosis not present

## 2018-10-16 DIAGNOSIS — J188 Other pneumonia, unspecified organism: Secondary | ICD-10-CM | POA: Diagnosis not present

## 2018-10-23 ENCOUNTER — Other Ambulatory Visit: Payer: Self-pay | Admitting: Acute Care

## 2018-10-23 DIAGNOSIS — R42 Dizziness and giddiness: Secondary | ICD-10-CM | POA: Diagnosis not present

## 2018-10-23 DIAGNOSIS — I6529 Occlusion and stenosis of unspecified carotid artery: Secondary | ICD-10-CM | POA: Diagnosis not present

## 2018-10-23 DIAGNOSIS — I4891 Unspecified atrial fibrillation: Secondary | ICD-10-CM | POA: Diagnosis not present

## 2018-10-31 ENCOUNTER — Ambulatory Visit: Admission: RE | Admit: 2018-10-31 | Payer: Medicare Other | Source: Ambulatory Visit

## 2018-11-08 DIAGNOSIS — M79674 Pain in right toe(s): Secondary | ICD-10-CM | POA: Diagnosis not present

## 2018-11-08 DIAGNOSIS — M79675 Pain in left toe(s): Secondary | ICD-10-CM | POA: Diagnosis not present

## 2018-11-08 DIAGNOSIS — B351 Tinea unguium: Secondary | ICD-10-CM | POA: Diagnosis not present

## 2018-11-09 DIAGNOSIS — J449 Chronic obstructive pulmonary disease, unspecified: Secondary | ICD-10-CM | POA: Diagnosis not present

## 2018-11-15 DIAGNOSIS — J449 Chronic obstructive pulmonary disease, unspecified: Secondary | ICD-10-CM | POA: Diagnosis not present

## 2018-12-06 DIAGNOSIS — I10 Essential (primary) hypertension: Secondary | ICD-10-CM | POA: Diagnosis not present

## 2018-12-10 DIAGNOSIS — J439 Emphysema, unspecified: Secondary | ICD-10-CM | POA: Diagnosis not present

## 2018-12-10 DIAGNOSIS — J9611 Chronic respiratory failure with hypoxia: Secondary | ICD-10-CM | POA: Diagnosis not present

## 2018-12-10 DIAGNOSIS — I10 Essential (primary) hypertension: Secondary | ICD-10-CM | POA: Diagnosis not present

## 2018-12-10 DIAGNOSIS — I4891 Unspecified atrial fibrillation: Secondary | ICD-10-CM | POA: Diagnosis not present

## 2018-12-10 DIAGNOSIS — Z0000 Encounter for general adult medical examination without abnormal findings: Secondary | ICD-10-CM | POA: Diagnosis not present

## 2018-12-10 DIAGNOSIS — J449 Chronic obstructive pulmonary disease, unspecified: Secondary | ICD-10-CM | POA: Diagnosis not present

## 2018-12-16 DIAGNOSIS — J449 Chronic obstructive pulmonary disease, unspecified: Secondary | ICD-10-CM | POA: Diagnosis not present

## 2018-12-17 IMAGING — MR MR HEAD W/O CM
9 of 10 series · 38 of 48 positions shown · non-contrast
Comparison: 05/31/2018 CT head.

CLINICAL DATA: 84 y/o F; acute altered mental status and increased
confusion.

EXAM:
MRI HEAD WITHOUT CONTRAST
TECHNIQUE: Multiplanar, multiecho pulse sequences of the brain and surrounding
structures were obtained without intravenous contrast.

[Series 2: GRE · sagittal · 5.0mm · 0.45mm/px · 3 of 22 slices shown (1 of 2)]
[im 1/22]
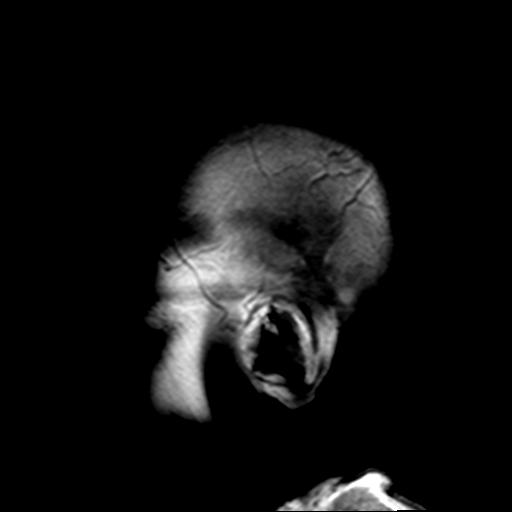
[im 11/22]
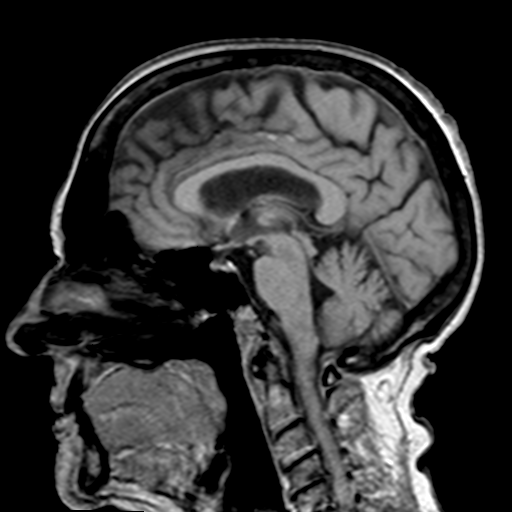
[im 22/22]
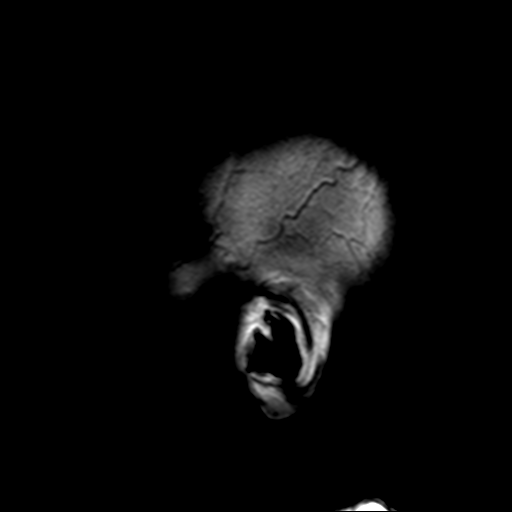

[Series 4: DWI · axial · 3.0mm · 1.80mm/px · z∈[-83,+64]mm · 7 of 48 slices shown (1 of 2)]
[im 1/48]
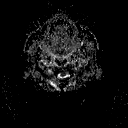
[im 8/48]
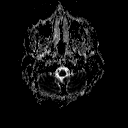
[im 16/48]
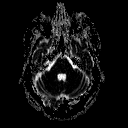
[im 24/48]
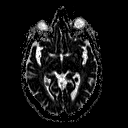
[im 32/48]
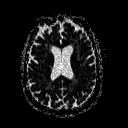
[im 40/48]
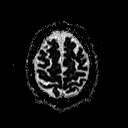
[im 48/48]
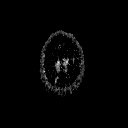

[Series 6: DWI · coronal · 3.0mm · 1.80mm/px · 6 of 42 slices shown (2 of 2)]
[im 1/42]
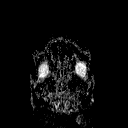
[im 9/42]
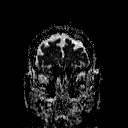
[im 17/42]
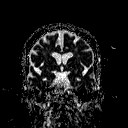
[im 25/42]
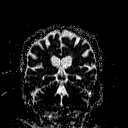
[im 33/42]
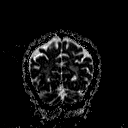
[im 42/42]
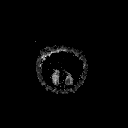

[Series 7: T2 · axial · 5.0mm · 0.45mm/px · z∈[-81,+62]mm · 3 of 23 slices shown (1 of 3)]
[im 1/23]
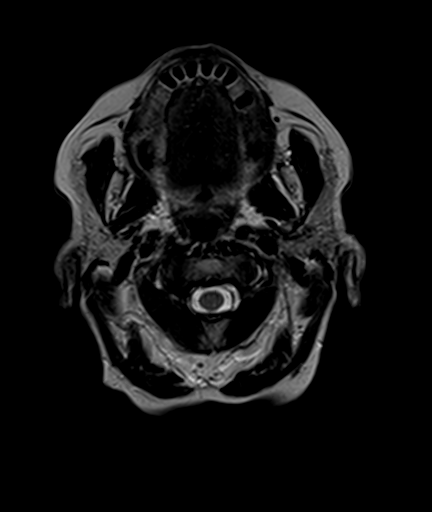
[im 12/23]
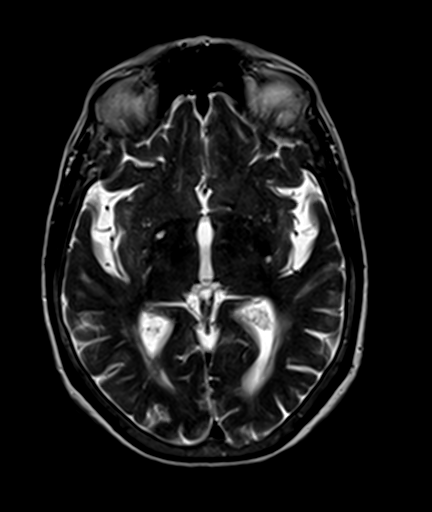
[im 23/23]
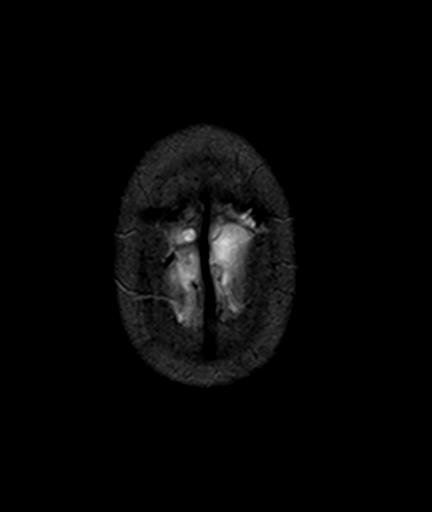

[Series 8: FLAIR · axial · 3.0mm · 0.45mm/px · z∈[-87,+69]mm · 7 of 53 slices shown]
[im 1/53]
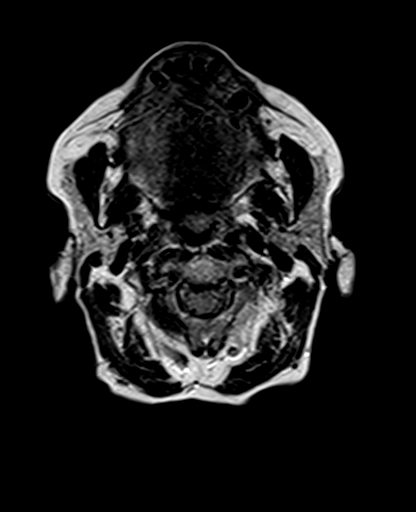
[im 9/53]
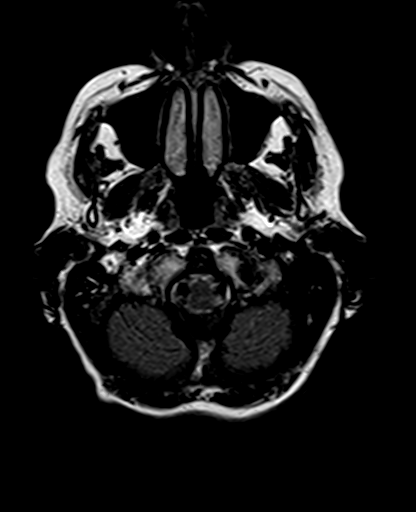
[im 18/53]
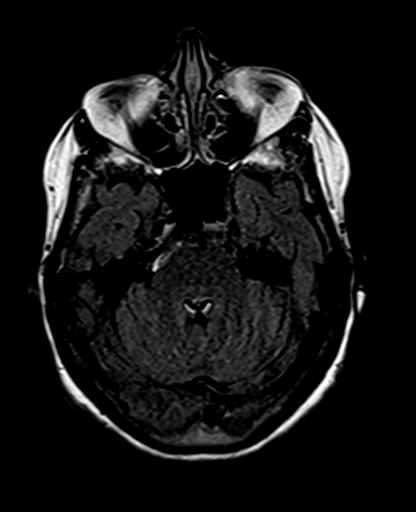
[im 27/53]
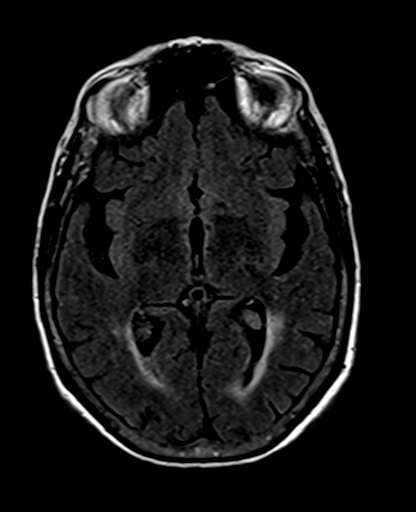
[im 35/53]
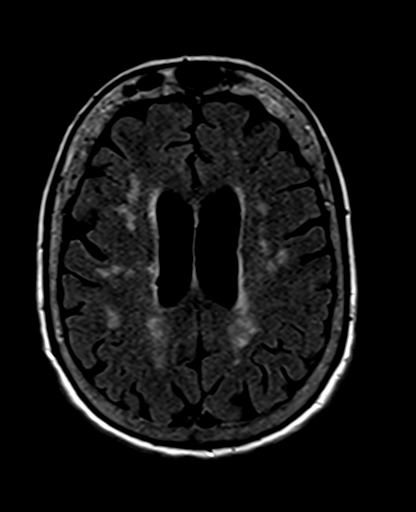
[im 44/53]
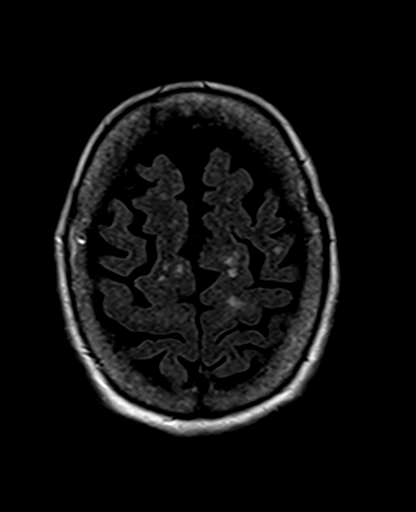
[im 53/53]
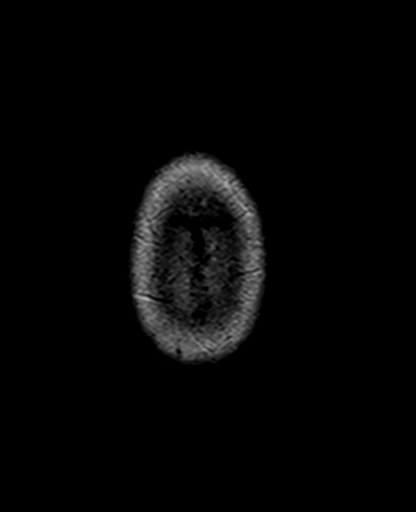

[Series 9: T2 · axial · 5.0mm · 1.20mm/px · z∈[-81,+62]mm · 3 of 23 slices shown (2 of 3)]
[im 1/23]
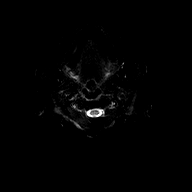
[im 12/23]
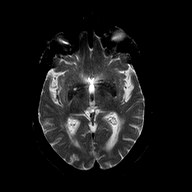
[im 23/23]
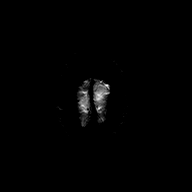

[Series 10: GRE · axial · 5.0mm · 0.45mm/px · z∈[-88,+68]mm · 3 of 25 slices shown (2 of 2)]
[im 1/25]
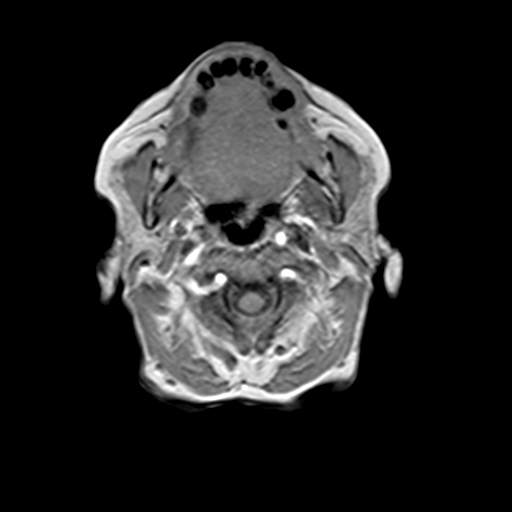
[im 13/25]
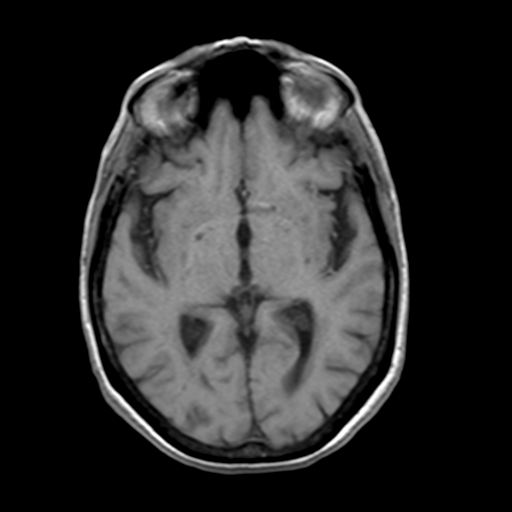
[im 25/25]
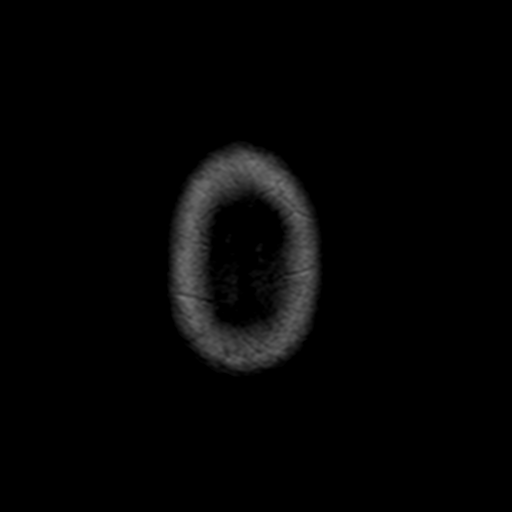

[Series 11: T2 · coronal · 5.0mm · 0.43mm/px · 4 of 26 slices shown (3 of 3)]
[im 1/26]
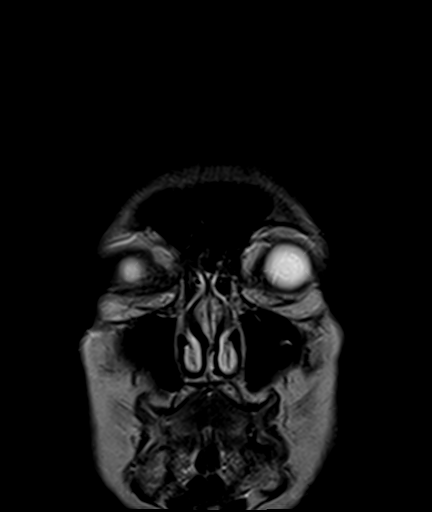
[im 9/26]
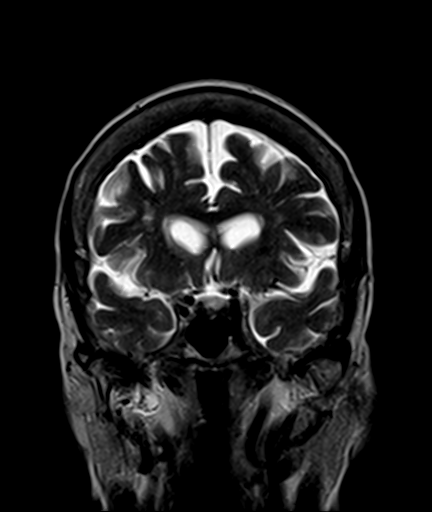
[im 17/26]
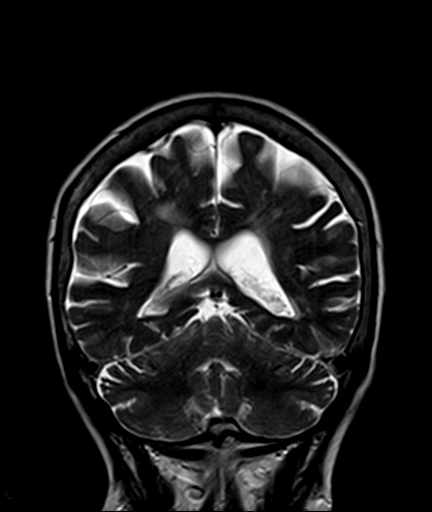
[im 26/26]
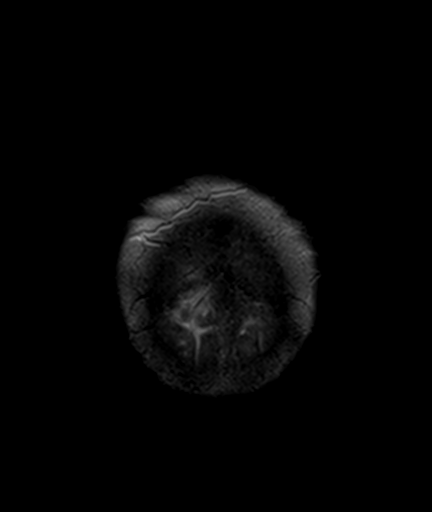

[Series 100: ax (id) · axial · 3.0mm · 1.80mm/px · z∈[-77,-47]mm · 2 of 47 slices shown]
[im 1/47]
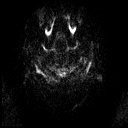
[im 10/47]
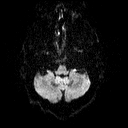

[38 of 48 positions shown; findings below may reference images not displayed]

FINDINGS: Brain: No acute infarction, hemorrhage, hydrocephalus, extra-axial
collection or mass lesion. Small chronic lacunar infarct within the
right putamen. Several nonspecific foci of T2 FLAIR hyperintense
signal abnormality in subcortical and periventricular white matter
are compatible with mild chronic microvascular ischemic changes for
age. Mild brain parenchymal volume loss.

Vascular: Normal flow voids.

Skull and upper cervical spine: Normal marrow signal.

Sinuses/Orbits: Negative.

Other: Bilateral intra-ocular lens replacement.
IMPRESSION: 1. No acute intracranial abnormality identified.
2. Mild for age chronic microvascular ischemic changes and
parenchymal volume loss of the brain.
3. Small chronic lacunar infarct in the right putamen.

By: Alia Tiger M.D.

## 2018-12-24 ENCOUNTER — Other Ambulatory Visit: Payer: Self-pay

## 2018-12-24 ENCOUNTER — Telehealth: Payer: Self-pay

## 2018-12-24 NOTE — Telephone Encounter (Signed)
lmom that she using which inhaler we receive symbicort its not on her med list

## 2018-12-25 ENCOUNTER — Other Ambulatory Visit: Payer: Self-pay

## 2018-12-25 MED ORDER — SYMBICORT 80-4.5 MCG/ACT IN AERO: 2.0000 | INHALATION_SPRAY | Freq: Two times a day (BID) | RESPIRATORY_TRACT | 3 refills | Status: DC

## 2019-01-03 ENCOUNTER — Ambulatory Visit (INDEPENDENT_AMBULATORY_CARE_PROVIDER_SITE_OTHER): Payer: Medicare Other | Admitting: Internal Medicine

## 2019-01-03 ENCOUNTER — Encounter: Payer: Self-pay | Admitting: Internal Medicine

## 2019-01-03 VITALS — BP 142/62 | HR 88 | Temp 97.1°F | Resp 18 | Ht <= 58 in | Wt 118.0 lb

## 2019-01-03 DIAGNOSIS — K219 Gastro-esophageal reflux disease without esophagitis: Secondary | ICD-10-CM

## 2019-01-03 DIAGNOSIS — C3412 Malignant neoplasm of upper lobe, left bronchus or lung: Secondary | ICD-10-CM | POA: Diagnosis not present

## 2019-01-03 DIAGNOSIS — R0602 Shortness of breath: Secondary | ICD-10-CM | POA: Diagnosis not present

## 2019-01-03 DIAGNOSIS — J449 Chronic obstructive pulmonary disease, unspecified: Secondary | ICD-10-CM

## 2019-01-03 NOTE — Patient Instructions (Signed)

## 2019-01-03 NOTE — Progress Notes (Signed)
[facility] [place], [place] [address]  Pulmonary Sleep Medicine   Office Visit Note  Patient Name: Bethany Lambert DOB: October 07, 1934 MRN [id number]  Date of Service: 01/03/2019  Complaints/HPI: SOB has been feeling dizzy when she stands. Patient is on BP meds. She has had this worked up including tilt table no diagnosis.  Patient states that she has been having this dizziness going on for years.  She has had a complete work-up done and they have not been able to find any causes for her dizziness.  She is on blood pressure medications however currently his blood pressure is actually not on the low side.  She probably has some evidence of orthostasis I suggested that she follow-up with her primary care and cardiology regarding the dizziness.  Breathing overall has been doing fairly well.  She has not had any admissions to the hospital.  As far as her lung cancer is concerned they are continuing to monitor it through oncology  ROS  General: (-) fever, (-) chills, (-) night sweats, (-) weakness Skin: (-) rashes, (-) itching,. Eyes: (-) visual changes, (-) redness, (-) itching. Nose and Sinuses: (-) nasal stuffiness or itchiness, (-) postnasal drip, (-) nosebleeds, (-) sinus trouble. Mouth and Throat: (-) sore throat, (-) hoarseness. Neck: (-) swollen glands, (-) enlarged thyroid, (-) neck pain. Respiratory: - cough, (-) bloody sputum, + shortness of breath, - wheezing. Cardiovascular: - ankle swelling, (-) chest pain. Lymphatic: (-) lymph node enlargement. Neurologic: (-) numbness, (-) tingling. Psychiatric: (-) anxiety, (-) depression   Current Medication: Outpatient Encounter Medications as of 01/03/2019  Medication Sig  . albuterol (PROVENTIL HFA;VENTOLIN HFA) 108 (90 Base) MCG/ACT inhaler Inhale 2 puffs into the lungs every 6 (six) hours as needed for wheezing.  . Cyanocobalamin (VITAMIN B-12 IJ) Inject 1,000 mcg as directed every 30 (thirty) days.   Marland Kitchen  diltiazem (CARDIZEM CD) 120 MG 24 hr capsule   . docusate sodium (COLACE) 100 MG capsule Take 100-200 mg by mouth 2 (two) times daily. 100 mg in the morning and 200 mg at bedtime  . fluocinonide (LIDEX) 0.05 % external solution Apply 1 application topically 2 (two) times daily.   . Fluticasone-Umeclidin-Vilant (TRELEGY ELLIPTA) 100-62.5-25 MCG/INH AEPB Inhale 1 Dose into the lungs daily.  Marland Kitchen ipratropium-albuterol (DUONEB) 0.5-2.5 (3) MG/3ML SOLN Take 3 mLs by nebulization every 6 (six) hours as needed (wheezing).   Marland Kitchen loratadine (CLARITIN) 10 MG tablet Take 1 tablet (10 mg total) by mouth daily.  . metoprolol (LOPRESSOR) 50 MG tablet Take 1 tablet (50 mg total) by mouth 2 (two) times daily.  Marland Kitchen omeprazole (PRILOSEC) 20 MG capsule Take 20 mg by mouth 2 (two) times daily before a meal.   . OXYGEN Inhale 3 L into the lungs continuous.   . potassium chloride (K-DUR) 10 MEQ tablet   . Prenatal Vit-Fe Fumarate-FA (PRENATAL MULTIVITAMIN) TABS tablet Take 1 tablet by mouth daily at 12 noon.  . SYMBICORT 80-4.5 MCG/ACT inhaler Inhale 2 puffs into the lungs 2 (two) times daily.  . traZODone (DESYREL) 50 MG tablet Take 1 tablet (50 mg total) by mouth at bedtime.   No facility-administered encounter medications on file as of 01/03/2019.     Surgical History: Past Surgical History:  Procedure Laterality Date  . CATARACT EXTRACTION    . COLON RESECTION    . CT GUIDED BIOPSY  ([facility] HX)  11/19/2012   lung  . HERNIA REPAIR    . TOTAL ABDOMINAL HYSTERECTOMY  Medical History: Past Medical History:  Diagnosis Date  . COPD (chronic obstructive pulmonary disease) (HCC)    on 2L home o2  . Erythrocytosis   . GERD (gastroesophageal reflux disease)   . History of chemotherapy   . History of radiation therapy   . Hyperlipidemia   . Hypertension   . Kidney failure   . Lung cancer ([place])    squamous, stage 3 non small cell lung ca  . Polycythemia   . Psoriasis     Family History: Family History   Problem Relation Age of Onset  . Heart attack Father   . Colon cancer Father   . Stroke Mother   . Cancer Son     Social History: Social History   Socioeconomic History  . Marital status: Widowed    Spouse name: Not on file  . Number of children: Not on file  . Years of education: Not on file  . Highest education level: Not on file  Occupational History  . Not on file  Social Needs  . Financial resource strain: Not on file  . Food insecurity:    Worry: Not on file    Inability: Not on file  . Transportation needs:    Medical: Not on file    Non-medical: Not on file  Tobacco Use  . Smoking status: Former Smoker    Packs/day: 2.00    Years: 40.00    Pack years: 80.00    Types: Cigarettes  . Smokeless tobacco: Never Used  Substance and Sexual Activity  . Alcohol use: No    Alcohol/week: 0.0 standard drinks  . Drug use: No  . Sexual activity: Not on file  Lifestyle  . Physical activity:    Days per week: Not on file    Minutes per session: Not on file  . Stress: Not on file  Relationships  . Social connections:    Talks on phone: Not on file    Gets together: Not on file    Attends religious service: Not on file    Active member of club or organization: Not on file    Attends meetings of clubs or organizations: Not on file    Relationship status: Not on file  . Intimate partner violence:    Fear of current or ex partner: Not on file    Emotionally abused: Not on file    Physically abused: Not on file    Forced sexual activity: Not on file  Other Topics Concern  . Not on file  Social History Narrative   Lives at home with her son.  Has a walker but mostly gets around without it.    Vital Signs: Blood pressure (!) 142/62, pulse 88, temperature (!) 97.1 F (36.2 C), temperature source Oral, resp. rate 18, height 4\' 10"  (1.473 m), weight 118 lb (53.5 kg), SpO2 95 %.  Examination: General Appearance: The patient is well-developed, well-nourished, and in no  distress. Skin: Gross inspection of skin unremarkable. Head: normocephalic, no gross deformities. Eyes: no gross deformities noted. ENT: ears appear grossly normal no exudates. Neck: Supple. No thyromegaly. No LAD. Respiratory: few rhonchi noted. Cardiovascular: Normal S1 and S2 without murmur or rub. Extremities: No cyanosis. pulses are equal. Neurologic: Alert and oriented. No involuntary movements.  LABS: No results found for this or any previous visit (from the past 2160 hour(s)).  Radiology: No results found.  No results found.  No results found.    Assessment and Plan: Patient Active Problem List  Diagnosis Date Noted  . Hyponatremia 05/31/2018  . Pneumonia 05/18/2018  . Pancreatic cyst 07/10/2017  . COPD exacerbation ([place]) 09/09/2016  . GERD (gastroesophageal reflux disease) 09/09/2016  . HTN (hypertension) 09/09/2016  . Cancer of upper lobe of left lung ([place]) 07/06/2016  . Tachycardia 02/03/2013  . Dyspnea 02/03/2013    1. COPD severe continue with present therapy.  She once again we reviewed her medications and she was instructed to use the medications as prescribed. 2. GERD controlled at this time we will continue with supportive care 3. SOB she is oxygen dependent she obtains benefit from using the oxygen 4. LUL Cancer follow-up with oncology  General Counseling: I have discussed the findings of the evaluation and examination with Bethany Lambert.  I have also discussed any further diagnostic evaluation thatmay be needed or ordered today. Bethany Lambert verbalizes understanding of the findings of todays visit. We also reviewed her medications today and discussed drug interactions and side effects including but not limited excessive drowsiness and altered mental states. We also discussed that there is always a risk not just to her but also people around her. she has been encouraged to call the office with any questions or concerns that should arise related to todays  visit.    Time spent: 15 minutes  I have personally obtained a history, examined the patient, evaluated laboratory and imaging results, formulated the assessment and plan and placed orders.    Allyne Gee, MD [facility] Pulmonary and Critical Care Sleep medicine

## 2019-01-10 DIAGNOSIS — J449 Chronic obstructive pulmonary disease, unspecified: Secondary | ICD-10-CM | POA: Diagnosis not present

## 2019-01-16 DIAGNOSIS — J449 Chronic obstructive pulmonary disease, unspecified: Secondary | ICD-10-CM | POA: Diagnosis not present

## 2019-01-23 ENCOUNTER — Inpatient Hospital Stay: Payer: Medicare Other | Attending: Internal Medicine

## 2019-01-23 ENCOUNTER — Inpatient Hospital Stay (HOSPITAL_BASED_OUTPATIENT_CLINIC_OR_DEPARTMENT_OTHER): Payer: Medicare Other | Admitting: Internal Medicine

## 2019-01-23 ENCOUNTER — Other Ambulatory Visit: Payer: Self-pay

## 2019-01-23 ENCOUNTER — Encounter: Payer: Self-pay | Admitting: Internal Medicine

## 2019-01-23 VITALS — BP 127/67 | HR 76 | Temp 98.0°F | Resp 18 | Ht <= 58 in | Wt 115.6 lb

## 2019-01-23 DIAGNOSIS — C3412 Malignant neoplasm of upper lobe, left bronchus or lung: Secondary | ICD-10-CM | POA: Diagnosis not present

## 2019-01-23 DIAGNOSIS — D649 Anemia, unspecified: Secondary | ICD-10-CM | POA: Insufficient documentation

## 2019-01-23 DIAGNOSIS — J449 Chronic obstructive pulmonary disease, unspecified: Secondary | ICD-10-CM | POA: Diagnosis not present

## 2019-01-23 DIAGNOSIS — Z87891 Personal history of nicotine dependence: Secondary | ICD-10-CM

## 2019-01-23 DIAGNOSIS — Z79899 Other long term (current) drug therapy: Secondary | ICD-10-CM | POA: Diagnosis not present

## 2019-01-23 DIAGNOSIS — E876 Hypokalemia: Secondary | ICD-10-CM

## 2019-01-23 LAB — IRON AND TIBC
Iron: 59 ug/dL (ref 28–170)
Saturation Ratios: 24 % (ref 10.4–31.8)
TIBC: 245 ug/dL — AB (ref 250–450)
UIBC: 186 ug/dL

## 2019-01-23 LAB — BASIC METABOLIC PANEL
Anion gap: 11 (ref 5–15)
BUN: 11 mg/dL (ref 8–23)
CHLORIDE: 97 mmol/L — AB (ref 98–111)
CO2: 25 mmol/L (ref 22–32)
Calcium: 8.4 mg/dL — ABNORMAL LOW (ref 8.9–10.3)
Creatinine, Ser: 1.05 mg/dL — ABNORMAL HIGH (ref 0.44–1.00)
GFR calc non Af Amer: 49 mL/min — ABNORMAL LOW (ref >60)
GFR, EST AFRICAN AMERICAN: 57 mL/min — AB (ref >60)
GLUCOSE: 122 mg/dL — AB (ref 70–99)
POTASSIUM: 3.1 mmol/L — AB (ref 3.5–5.1)
Sodium: 133 mmol/L — ABNORMAL LOW (ref 135–145)

## 2019-01-23 LAB — CBC WITH DIFFERENTIAL/PLATELET
Abs Immature Granulocytes: 0.05 10*3/uL (ref 0.00–0.07)
Basophils Absolute: 0.1 10*3/uL (ref 0.0–0.1)
Basophils Relative: 1 %
EOS ABS: 0.1 10*3/uL (ref 0.0–0.5)
EOS PCT: 1 %
HCT: 30.8 % — ABNORMAL LOW (ref 36.0–46.0)
HEMOGLOBIN: 10.6 g/dL — AB (ref 12.0–15.0)
Immature Granulocytes: 1 %
LYMPHS ABS: 0.7 10*3/uL (ref 0.7–4.0)
LYMPHS PCT: 9 %
MCH: 30.7 pg (ref 26.0–34.0)
MCHC: 34.4 g/dL (ref 30.0–36.0)
MCV: 89.3 fL (ref 80.0–100.0)
MONO ABS: 0.8 10*3/uL (ref 0.1–1.0)
Monocytes Relative: 10 %
Neutro Abs: 6 10*3/uL (ref 1.7–7.7)
Neutrophils Relative %: 78 %
Platelets: 211 10*3/uL (ref 150–400)
RBC: 3.45 MIL/uL — ABNORMAL LOW (ref 3.87–5.11)
RDW: 12.8 % (ref 11.5–15.5)
WBC: 7.7 10*3/uL (ref 4.0–10.5)
nRBC: 0 % (ref 0.0–0.2)

## 2019-01-23 LAB — FERRITIN: Ferritin: 122 ng/mL (ref 11–307)

## 2019-01-23 NOTE — Progress Notes (Signed)
[facility] OFFICE PROGRESS NOTE  Patient Care Team: Baxter Hire, [name]  Cancer Staging No matching staging information was found for the patient.   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  # COPD Home O2 2.5L -  # 2019-2020-mild anemia- Hb-10; no IDA; colo-"many years ago".   # DIAGNOSIS: Lung cancer- LUL stage III/ RUL stage I  GOALS: cuartive  CURRENT/MOST RECENT THERAPY; surveillaince      Cancer of upper lobe of left lung (HCC)    INTERVAL HISTORY:  Bethany Lambert 85 y.o.  female pleasant patient above history of lung cancer/COPD pancreatic cysts is here for follow-up.   Patient continues to feel poorly overall.  Continues her chronic shortness of breath.  Chronic cough.  No hemoptysis.  She continues to have chronic dizzy spells/status post evaluation with ENT physical therapy.  Patient denies any worsening abdominal pain.  Denies any constipation diarrhea.  Denies any blood in stools or black or stools.  Review of Systems  Constitutional: Negative for chills, diaphoresis, fever, malaise/fatigue and weight loss.  HENT: Negative for nosebleeds and sore throat.   Eyes: Negative for double vision.  Respiratory: Positive for cough and shortness of breath. Negative for hemoptysis and sputum production.   Cardiovascular: Negative for chest pain, palpitations, orthopnea and leg swelling.  Gastrointestinal: Negative for abdominal pain, blood in stool, constipation, diarrhea, heartburn, melena, nausea and vomiting.  Genitourinary: Negative for dysuria, frequency  and urgency.  Musculoskeletal: Positive for back pain and joint pain.  Skin: Negative.  Negative for itching and rash.  Neurological: Negative for dizziness, tingling, focal weakness, weakness and headaches.  Endo/Heme/Allergies: Does not bruise/bleed easily.  Psychiatric/Behavioral: Negative for depression. The patient is not nervous/anxious and does not have insomnia.       PAST MEDICAL HISTORY :  Past Medical History:  Diagnosis Date  . COPD (chronic obstructive pulmonary disease) (HCC)    on 2L home o2  . Dizziness   . Erythrocytosis   . GERD (gastroesophageal reflux disease)   . History of chemotherapy   . History of radiation therapy   . Hyperlipidemia   . Hypertension   . Kidney failure   . Lung cancer ([place])    squamous, stage 3 non small cell lung ca  . Polycythemia   . Psoriasis     PAST SURGICAL HISTORY :   Past Surgical History:  Procedure Laterality Date  . CATARACT EXTRACTION    . COLON RESECTION    . CT GUIDED BIOPSY  ([facility] HX)  11/19/2012   lung  . HERNIA REPAIR    . TOTAL ABDOMINAL HYSTERECTOMY      FAMILY HISTORY :   Family History  Problem Relation Age of Onset  . Heart attack Father   . Colon cancer Father   . Stroke Mother   . Cancer Son     SOCIAL HISTORY:   Social History   Tobacco Use  . Smoking status: Former Smoker    Packs/day: 2.00    Years: 40.00    Pack years: 80.00    Types:  Cigarettes    Last attempt to quit: 06/22/2001    Years since quitting: 17.6  . Smokeless tobacco: Never Used  Substance Use Topics  . Alcohol use: No    Alcohol/week: 0.0 standard drinks  . Drug use: No    ALLERGIES:  is allergic to ciprofloxacin and norvasc [amlodipine besylate].  MEDICATIONS:  Current Outpatient Medications  Medication Sig Dispense Refill  . albuterol (PROVENTIL HFA;VENTOLIN HFA) 108 (90 Base) MCG/ACT inhaler Inhale 2 puffs into the lungs every 6 (six) hours as needed for wheezing. 2 Inhaler 5  . ALPRAZolam (XANAX) 0.25 MG  tablet Take 0.25 mg by mouth at bedtime as needed for anxiety.    . Cyanocobalamin (VITAMIN B-12 IJ) Inject 1,000 mcg as directed every 30 (thirty) days.     Marland Kitchen diltiazem (CARDIZEM CD) 120 MG 24 hr capsule   2  . docusate sodium (COLACE) 100 MG capsule Take 100-200 mg by mouth 2 (two) times daily. 100 mg in the morning and 200 mg at bedtime    . fluocinonide (LIDEX) 0.05 % external solution Apply 1 application topically 2 (two) times daily.     Marland Kitchen ipratropium-albuterol (DUONEB) 0.5-2.5 (3) MG/3ML SOLN Take 3 mLs by nebulization every 6 (six) hours as needed (wheezing).     Marland Kitchen loratadine (CLARITIN) 10 MG tablet Take 1 tablet (10 mg total) by mouth daily. 30 tablet 0  . Melatonin 5 MG TABS Take 1 tablet by mouth daily.    . metoprolol (LOPRESSOR) 50 MG tablet Take 1 tablet (50 mg total) by mouth 2 (two) times daily.    Marland Kitchen omeprazole (PRILOSEC) 20 MG capsule Take 20 mg by mouth 2 (two) times daily before a meal.     . OXYGEN Inhale 3 L into the lungs continuous.     . potassium chloride (K-DUR) 10 MEQ tablet 10 mEq daily.   11  . Prenatal Vit-Fe Fumarate-FA (PRENATAL MULTIVITAMIN) TABS tablet Take 1 tablet by mouth daily at 12 noon.    . SYMBICORT 80-4.5 MCG/ACT inhaler Inhale 2 puffs into the lungs 2 (two) times daily. 1 Inhaler 3   No current facility-administered medications for this visit.     PHYSICAL EXAMINATION: ECOG PERFORMANCE STATUS: 1 - Symptomatic but completely ambulatory  BP 127/67   Pulse 76   Temp 98 F (36.7 C) (Tympanic)   Resp 18   Ht 4\' 10"  (1.473 m)   Wt 115 lb 9.6 oz (52.4 kg)   BMI 24.16 kg/m   Filed Weights   01/23/19 1049  Weight: 115 lb 9.6 oz (52.4 kg)    Physical Exam  Constitutional: She is oriented to person, place, and time.  Thin built Caucasian female patient.  Accompanied by husband.  She is in a wheelchair.  2.5 L of oxygen.  HENT:  Head: Normocephalic and atraumatic.  Mouth/Throat: Oropharynx is clear and moist. No oropharyngeal exudate.  Eyes:  Pupils are equal, round, and reactive to light.  Neck: Normal range of motion. Neck supple.  Cardiovascular: Normal rate and regular rhythm.  Pulmonary/Chest: No respiratory distress. She has no wheezes.  Decreased air entry bilaterally.  Abdominal: Soft. Bowel sounds are normal. She exhibits no distension and no mass. There is no abdominal tenderness. There is no rebound and no guarding.  Musculoskeletal: Normal range of motion.        General: No tenderness or edema.  Neurological: She is alert and oriented to person, place, and time.  Skin: Skin is warm.  Psychiatric: Affect normal.  LABORATORY DATA:  I have reviewed the data as listed    Component Value Date/Time   NA 133 (L) 01/23/2019 0958   NA 136 12/29/2014 0827   K 3.1 (L) 01/23/2019 0958   K 4.0 12/29/2014 0827   CL 97 (L) 01/23/2019 0958   CL 102 12/29/2014 0827   CO2 25 01/23/2019 0958   CO2 28 12/29/2014 0827   GLUCOSE 122 (H) 01/23/2019 0958   GLUCOSE 115 (H) 12/29/2014 0827   BUN 11 01/23/2019 0958   BUN 15 12/29/2014 0827   CREATININE 1.05 (H) 01/23/2019 0958   CREATININE 1.23 12/29/2014 0827   CALCIUM 8.4 (L) 01/23/2019 0958   CALCIUM 8.8 12/29/2014 0827   PROT 6.5 07/23/2018 1003   PROT 6.3 (L) 12/01/2014 1355   ALBUMIN 3.4 (L) 07/23/2018 1003   ALBUMIN 3.2 (L) 12/01/2014 1355   AST 21 07/23/2018 1003   AST 14 (L) 12/01/2014 1355   ALT 14 07/23/2018 1003   ALT 22 12/01/2014 1355   ALKPHOS 84 07/23/2018 1003   ALKPHOS 100 12/01/2014 1355   BILITOT 0.7 07/23/2018 1003   BILITOT 0.3 12/01/2014 1355   GFRNONAA 49 (L) 01/23/2019 0958   GFRNONAA 45 (L) 12/29/2014 0827   GFRNONAA 42 (L) 08/13/2014 1020   GFRAA 57 (L) 01/23/2019 0958   GFRAA 54 (L) 12/29/2014 0827   GFRAA 49 (L) 08/13/2014 1020    No results found for: SPEP, UPEP  Lab Results  Component Value Date   WBC 7.7 01/23/2019   NEUTROABS 6.0 01/23/2019   HGB 10.6 (L) 01/23/2019   HCT 30.8 (L) 01/23/2019   MCV 89.3 01/23/2019    PLT 211 01/23/2019      Chemistry      Component Value Date/Time   NA 133 (L) 01/23/2019 0958   NA 136 12/29/2014 0827   K 3.1 (L) 01/23/2019 0958   K 4.0 12/29/2014 0827   CL 97 (L) 01/23/2019 0958   CL 102 12/29/2014 0827   CO2 25 01/23/2019 0958   CO2 28 12/29/2014 0827   BUN 11 01/23/2019 0958   BUN 15 12/29/2014 0827   CREATININE 1.05 (H) 01/23/2019 0958   CREATININE 1.23 12/29/2014 0827      Component Value Date/Time   CALCIUM 8.4 (L) 01/23/2019 0958   CALCIUM 8.8 12/29/2014 0827   ALKPHOS 84 07/23/2018 1003   ALKPHOS 100 12/01/2014 1355   AST 21 07/23/2018 1003   AST 14 (L) 12/01/2014 1355   ALT 14 07/23/2018 1003   ALT 22 12/01/2014 1355   BILITOT 0.7 07/23/2018 1003   BILITOT 0.3 12/01/2014 1355       RADIOGRAPHIC STUDIES: I have personally reviewed the radiological images as listed and agreed with the findings in the report. No results found.   ASSESSMENT & PLAN:  Cancer of upper lobe of left lung ([place]) # Right upper lobe lung nodule [11 x 8 mm] s/p SBRT in end of July 2017.  Stable.  We will repeat a scan in 6 months.  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  Stable.  Await CT scan in 6 months.  # Pancreatic cyst-February 2019 MRI abdomen shows multiple cysts in the pancreas-pseudocyst-not concerning for malignancy.  Stable.  Will repeat MRI in spring 2021./Will order at next visit.  # Mild anemia- hb 10.6 "colo-long time ago" s/p surgery [2012]; stool occult x3.  Check iron studies/ferritin.  # vertigo-status post physical therapy not improved/worse. Neurologist.  #COPD  chronic.  Stable/continue follow-up with Dr. Yancey Flemings.  #Hypokalemia continue potassium supplementation.  #Dental extraction-okay from oncology standpoint.  # DISPOSITION: add iron studies/ferritin/ stool cards x2.  # follow up in 6 months- MD- cbc/cmp; CT scan prior-Dr.B  c; Dr.Johnston/ Dr.Khan   Orders  Placed This Encounter  Procedures  . CT CHEST W CONTRAST    Standing Status:   Future    Standing Expiration Date:   01/24/2020    Order Specific Question:   If indicated for the ordered procedure, I authorize the administration of contrast media per Radiology protocol    Answer:   Yes    Order Specific Question:   Preferred imaging location?    Answer:   [facility]    Order Specific Question:   Radiology Contrast Protocol - do NOT remove file path    Answer:   [email or web address]    Order Specific Question:   ** REASON FOR EXAM (FREE TEXT)    Answer:   lung cnacer  . Ferritin    Standing Status:   Future    Number of Occurrences:   1    Standing Expiration Date:   01/24/2020  . Iron and TIBC    Standing Status:   Future    Number of Occurrences:   1    Standing Expiration Date:   01/24/2020  . Occult blood card to lab, stool    Standing Status:   Future    Standing Expiration Date:   01/24/2020  . Occult blood card to lab, stool    Standing Status:   Future    Standing Expiration Date:   01/24/2020   All questions were answered. The patient knows to call the clinic with any problems, questions or concerns.      Cammie Sickle, MD 01/23/2019 8:29 PM

## 2019-01-23 NOTE — Assessment & Plan Note (Addendum)
#   Right upper lobe lung nodule [11 x 8 mm] s/p SBRT in end of July 2017.  Stable.  We will repeat a scan in 6 months.  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  Stable.  Await CT scan in 6 months.  # Pancreatic cyst-February 2019 MRI abdomen shows multiple cysts in the pancreas-pseudocyst-not concerning for malignancy.  Stable.  Will repeat MRI in spring 2021./Will order at next visit.  # Mild anemia- hb 10.6 "colo-long time ago" s/p surgery [2012]; stool occult x3.  Check iron studies/ferritin.  # vertigo-status post physical therapy not improved/worse. Neurologist.  #COPD chronic.  Stable/continue follow-up with Dr. Yancey Flemings.  #Hypokalemia continue potassium supplementation.  #Dental extraction-okay from oncology standpoint.  # DISPOSITION: add iron studies/ferritin/ stool cards x2.  # follow up in 6 months- MD- cbc/cmp; CT scan prior-Dr.B  c; Dr.Johnston/ Dr.Khan

## 2019-01-23 NOTE — Progress Notes (Signed)
Pt has cramps in her hands and feet, she also had dizziness all the time and she has been in hospital and several tests and never found out why she is dizzy

## 2019-02-08 DIAGNOSIS — J188 Other pneumonia, unspecified organism: Secondary | ICD-10-CM | POA: Diagnosis not present

## 2019-02-08 DIAGNOSIS — J449 Chronic obstructive pulmonary disease, unspecified: Secondary | ICD-10-CM | POA: Diagnosis not present

## 2019-02-14 DIAGNOSIS — J188 Other pneumonia, unspecified organism: Secondary | ICD-10-CM | POA: Diagnosis not present

## 2019-02-14 DIAGNOSIS — J449 Chronic obstructive pulmonary disease, unspecified: Secondary | ICD-10-CM | POA: Diagnosis not present

## 2019-02-14 DIAGNOSIS — J9610 Chronic respiratory failure, unspecified whether with hypoxia or hypercapnia: Secondary | ICD-10-CM | POA: Diagnosis not present

## 2019-02-19 DIAGNOSIS — J9610 Chronic respiratory failure, unspecified whether with hypoxia or hypercapnia: Secondary | ICD-10-CM | POA: Diagnosis not present

## 2019-02-19 DIAGNOSIS — J188 Other pneumonia, unspecified organism: Secondary | ICD-10-CM | POA: Diagnosis not present

## 2019-02-19 DIAGNOSIS — J449 Chronic obstructive pulmonary disease, unspecified: Secondary | ICD-10-CM | POA: Diagnosis not present

## 2019-02-25 DIAGNOSIS — J188 Other pneumonia, unspecified organism: Secondary | ICD-10-CM | POA: Diagnosis not present

## 2019-02-25 DIAGNOSIS — J449 Chronic obstructive pulmonary disease, unspecified: Secondary | ICD-10-CM | POA: Diagnosis not present

## 2019-02-25 DIAGNOSIS — J9610 Chronic respiratory failure, unspecified whether with hypoxia or hypercapnia: Secondary | ICD-10-CM | POA: Diagnosis not present

## 2019-03-08 ENCOUNTER — Other Ambulatory Visit: Payer: Self-pay

## 2019-03-08 ENCOUNTER — Emergency Department
Admission: EM | Admit: 2019-03-08 | Discharge: 2019-03-08 | Disposition: A | Discharge status: Home / Self Care | Payer: Medicare Other | Attending: Emergency Medicine | Admitting: Emergency Medicine

## 2019-03-08 DIAGNOSIS — R300 Dysuria: Secondary | ICD-10-CM

## 2019-03-08 DIAGNOSIS — Z85118 Personal history of other malignant neoplasm of bronchus and lung: Secondary | ICD-10-CM | POA: Diagnosis not present

## 2019-03-08 DIAGNOSIS — Z79899 Other long term (current) drug therapy: Secondary | ICD-10-CM | POA: Insufficient documentation

## 2019-03-08 DIAGNOSIS — Z87891 Personal history of nicotine dependence: Secondary | ICD-10-CM | POA: Insufficient documentation

## 2019-03-08 DIAGNOSIS — N3090 Cystitis, unspecified without hematuria: Secondary | ICD-10-CM | POA: Diagnosis not present

## 2019-03-08 DIAGNOSIS — J449 Chronic obstructive pulmonary disease, unspecified: Secondary | ICD-10-CM | POA: Diagnosis not present

## 2019-03-08 DIAGNOSIS — R531 Weakness: Secondary | ICD-10-CM | POA: Diagnosis not present

## 2019-03-08 DIAGNOSIS — I10 Essential (primary) hypertension: Secondary | ICD-10-CM | POA: Diagnosis not present

## 2019-03-08 LAB — COMPREHENSIVE METABOLIC PANEL
ALT: 24 U/L (ref 0–44)
AST: 29 U/L (ref 15–41)
Albumin: 3.7 g/dL (ref 3.5–5.0)
Alkaline Phosphatase: 69 U/L (ref 38–126)
Anion gap: 14 (ref 5–15)
BUN: 12 mg/dL (ref 8–23)
CO2: 26 mmol/L (ref 22–32)
Calcium: 9 mg/dL (ref 8.9–10.3)
Chloride: 90 mmol/L — ABNORMAL LOW (ref 98–111)
Creatinine, Ser: 0.85 mg/dL (ref 0.44–1.00)
GFR calc Af Amer: >60 mL/min (ref >60)
GFR calc non Af Amer: >60 mL/min (ref >60)
Glucose, Bld: 143 mg/dL — ABNORMAL HIGH (ref 70–99)
Potassium: 3.8 mmol/L (ref 3.5–5.1)
Sodium: 130 mmol/L — ABNORMAL LOW (ref 135–145)
Total Bilirubin: 0.5 mg/dL (ref 0.3–1.2)
Total Protein: 6.6 g/dL (ref 6.5–8.1)

## 2019-03-08 LAB — CBC
HCT: 31.9 % — ABNORMAL LOW (ref 36.0–46.0)
Hemoglobin: 11.1 g/dL — ABNORMAL LOW (ref 12.0–15.0)
MCH: 31.5 pg (ref 26.0–34.0)
MCHC: 34.8 g/dL (ref 30.0–36.0)
MCV: 90.6 fL (ref 80.0–100.0)
Platelets: 242 10*3/uL (ref 150–400)
RBC: 3.52 MIL/uL — ABNORMAL LOW (ref 3.87–5.11)
RDW: 13.4 % (ref 11.5–15.5)
WBC: 6.1 10*3/uL (ref 4.0–10.5)
nRBC: 0 % (ref 0.0–0.2)

## 2019-03-08 LAB — URINALYSIS, ROUTINE W REFLEX MICROSCOPIC
Bacteria, UA: NONE SEEN
Bilirubin Urine: NEGATIVE
Glucose, UA: NEGATIVE mg/dL
Ketones, ur: NEGATIVE mg/dL
Nitrite: NEGATIVE
Protein, ur: NEGATIVE mg/dL
Specific Gravity, Urine: 1.005 (ref 1.005–1.030)
pH: 7 (ref 5.0–8.0)

## 2019-03-08 MED ORDER — SODIUM CHLORIDE 0.9 % IV BOLUS
1000.0000 mL | Freq: Once | INTRAVENOUS | Status: AC
  Administered 2019-03-08: 1000 mL via INTRAVENOUS

## 2019-03-08 MED ORDER — CEPHALEXIN 500 MG PO CAPS: 500.0000 mg | ORAL_CAPSULE | Freq: Two times a day (BID) | ORAL | 0 refills | Status: DC

## 2019-03-08 NOTE — ED Provider Notes (Signed)
-----------------------------------------   4:06 PM on 03/08/2019 -----------------------------------------   Urinalysis shows moderate leukocytes and moderate red blood cells, consistent with cystitis.  I will prescribe Keflex twice daily for a week, recommend follow-up with primary care in a week for urine recheck.  Urine culture ordered.   Carrie Mew, MD 03/08/19 1606

## 2019-03-08 NOTE — ED Provider Notes (Signed)
[facility] Emergency Department Provider Note  ____________________________________________  Time seen: Approximately 2:39 PM  I have reviewed the triage vital signs and the nursing notes.   HISTORY  Chief Complaint Weakness and Urinary Frequency   HPI Bethany Lambert is a 85 y.o. female with a history of COPD on 2 L, hypertension, hyperlipidemia, lung cancer not currently on treatment who presents for evaluation of dysuria.  Patient reports that she had symptoms a month ago.  She was put on Macrobid with resolution of her symptoms.  Over the last 3 days she reports pressure before she needs to urinate.  She has small amount of burning with urination that resolves as soon as the urine stream is done.  No fever or chills, no nausea or vomiting, no abdominal pain or flank pain.  She has been feeling slightly tired over the last few days.  No cough, chest pain, shortness of breath.  Past Medical History:  Diagnosis Date  . COPD (chronic obstructive pulmonary disease) (HCC)    on 2L home o2  . Dizziness   . Erythrocytosis   . GERD (gastroesophageal reflux disease)   . History of chemotherapy   . History of radiation therapy   . Hyperlipidemia   . Hypertension   . Kidney failure   . Lung cancer ([place])    squamous, stage 3 non small cell lung ca  . Polycythemia   . Psoriasis     Patient Active Problem List   Diagnosis Date Noted  . Hyponatremia 05/31/2018  . Pneumonia 05/18/2018  . Pancreatic cyst 07/10/2017  . COPD exacerbation ([place]) 09/09/2016  . GERD (gastroesophageal reflux disease) 09/09/2016  . HTN (hypertension) 09/09/2016  . Cancer of upper lobe of left lung ([place]) 07/06/2016  . Tachycardia 02/03/2013  . Dyspnea 02/03/2013    Past Surgical History:  Procedure Laterality Date  . CATARACT EXTRACTION    . COLON RESECTION    . CT GUIDED BIOPSY  ([facility] HX)  11/19/2012   lung  . HERNIA REPAIR    . TOTAL ABDOMINAL HYSTERECTOMY       Prior to Admission medications   Medication Sig Start Date End Date Taking? Authorizing Provider  albuterol (PROVENTIL HFA;VENTOLIN HFA) 108 (90 Base) MCG/ACT inhaler Inhale 2 puffs into the lungs every 6 (six) hours as needed for wheezing. 03/27/18   Lavera Guise, MD  ALPRAZolam Duanne Moron) 0.25 MG tablet Take 0.25 mg by mouth at bedtime as needed for anxiety.    [provider]  Cyanocobalamin (VITAMIN B-12 IJ) Inject 1,000 mcg as directed every 30 (thirty) days.     [provider]  diltiazem (CARDIZEM CD) 120 MG 24 hr capsule  06/14/18   [provider]  docusate sodium (COLACE) 100 MG capsule Take 100-200 mg by mouth 2 (two) times daily. 100 mg in the morning and 200 mg at bedtime    [provider]  fluocinonide (LIDEX) 0.05 % external solution Apply 1 application topically 2 (two) times daily.  08/18/16   [provider]  ipratropium-albuterol (DUONEB) 0.5-2.5 (3) MG/3ML SOLN Take 3 mLs by nebulization every 6 (six) hours as needed (wheezing).     [provider]  loratadine (CLARITIN) 10 MG tablet Take 1 tablet (10 mg total) by mouth daily. 06/05/18   Dustin Flock, MD  Melatonin 5 MG TABS Take 1 tablet by mouth daily.    [provider]  metoprolol (LOPRESSOR) 50 MG tablet Take 1 tablet (50 mg total) by mouth  2 (two) times daily. 01/31/13   Wellington Hampshire, MD  omeprazole (PRILOSEC) 20 MG capsule Take 20 mg by mouth 2 (two) times daily before a meal.     [provider]  OXYGEN Inhale 3 L into the lungs continuous.     [provider]  potassium chloride (K-DUR) 10 MEQ tablet 10 mEq daily.  07/19/18   [provider]  Prenatal Vit-Fe Fumarate-FA (PRENATAL MULTIVITAMIN) TABS tablet Take 1 tablet by mouth daily at 12 noon.    [provider]  SYMBICORT 80-4.5 MCG/ACT inhaler Inhale 2 puffs into the lungs 2 (two) times daily. 12/25/18   Allyne Gee, MD    Allergies Ciprofloxacin and Norvasc  [amlodipine besylate]  Family History  Problem Relation Age of Onset  . Heart attack Father   . Colon cancer Father   . Stroke Mother   . Cancer Son     Social History Social History   Tobacco Use  . Smoking status: Former Smoker    Packs/day: 2.00    Years: 40.00    Pack years: 80.00    Types: Cigarettes    Last attempt to quit: 06/22/2001    Years since quitting: 17.7  . Smokeless tobacco: Never Used  Substance Use Topics  . Alcohol use: No    Alcohol/week: 0.0 standard drinks  . Drug use: No    Review of Systems  Constitutional: Negative for fever. + fatigue Eyes: Negative for visual changes. ENT: Negative for sore throat. Neck: No neck pain  Cardiovascular: Negative for chest pain. Respiratory: Negative for shortness of breath. Gastrointestinal: Negative for abdominal pain, vomiting or diarrhea. Genitourinary: + dysuria. Musculoskeletal: Negative for back pain. Skin: Negative for rash. Neurological: Negative for headaches, weakness or numbness. Psych: No SI or HI  ____________________________________________   PHYSICAL EXAM:  VITAL SIGNS: ED Triage Vitals  Enc Vitals Group     BP 03/08/19 1251 (!) 166/50     Pulse Rate 03/08/19 1251 (!) 104     Resp 03/08/19 1251 (!) 22     Temp 03/08/19 1251 98.1 F (36.7 C)     Temp Source 03/08/19 1251 Oral     SpO2 03/08/19 1251 100 %     Weight 03/08/19 1252 115 lb (52.2 kg)     Height 03/08/19 1252 4\' 10"  (1.473 m)     Head Circumference --      Peak Flow --      Pain Score 03/08/19 1252 0     Pain Loc --      Pain Edu? --      Excl. in [place]? --     Constitutional: Alert and oriented. Well appearing and in no apparent distress. HEENT:      Head: Normocephalic and atraumatic.         Eyes: Conjunctivae are normal. Sclera is non-icteric.       Mouth/Throat: Mucous membranes are moist.       Neck: Supple with no signs of meningismus. Cardiovascular: Tachycardic with regular rhythm. No murmurs, gallops, or  rubs.  Respiratory: Normal respiratory effort. Lungs are clear to auscultation bilaterally. No wheezes, crackles, or rhonchi.  Gastrointestinal: Soft, non tender, and non distended with positive bowel sounds. No rebound or guarding. Musculoskeletal: Nontender with normal range of motion in all extremities. No edema, cyanosis, or erythema of extremities. Neurologic: Normal speech and language. Face is symmetric. Moving all extremities. No gross focal neurologic deficits are appreciated. Skin: Skin is warm, dry and intact. No  rash noted. Psychiatric: Mood and affect are normal. Speech and behavior are normal.  ____________________________________________   LABS (all labs ordered are listed, but only abnormal results are displayed)  Labs Reviewed  COMPREHENSIVE METABOLIC PANEL - Abnormal; Notable for the following components:      Result Value   Sodium 130 (*)    Chloride 90 (*)    Glucose, Bld 143 (*)    All other components within normal limits  CBC - Abnormal; Notable for the following components:   RBC 3.52 (*)    Hemoglobin 11.1 (*)    HCT 31.9 (*)    All other components within normal limits  URINALYSIS, ROUTINE W REFLEX MICROSCOPIC   ____________________________________________  EKG  ED ECG REPORT I, Rudene Re, the attending physician, personally viewed and interpreted this ECG.   Normal sinus rhythm, rate of 75, normal intervals, normal axis, no ST elevations or depressions.  Otherwise normal EKG. ____________________________________________  RADIOLOGY  none ____________________________________________   PROCEDURES  Procedure(s) performed: None Procedures Critical Care performed:  None ____________________________________________   INITIAL IMPRESSION / ASSESSMENT AND PLAN / ED COURSE   85 y.o. female with a history of COPD on 2 L, hypertension, hyperlipidemia, lung cancer not currently on treatment who presents for evaluation of dysuria x 3 days.   Patient is well-appearing in no distress, slightly tachycardic with no fever, abdomen is soft with no tenderness throughout.  Labs showing normal white count.  Labs show normal white count.  No evidence of AKI or electrolyte abnormalities.  Stable mild anemia.  UA is pending to rule out UTI.  No signs of sepsis.  Will give IV fluids and reassess.    _________________________ 3:12 PM on 03/08/2019 -----------------------------------------  UA pending. Care transferred to Dr. Joni Fears   As part of my medical decision making, I reviewed the following data within the [facility] notes reviewed and incorporated, Labs reviewed , Old chart reviewed, Notes from prior ED visits and [place] Controlled Substance Database    Pertinent labs & imaging results that were available during my care of the patient were reviewed by me and considered in my medical decision making (see chart for details).    ____________________________________________   FINAL CLINICAL IMPRESSION(S) / ED DIAGNOSES  Final diagnoses:  Dysuria      NEW MEDICATIONS STARTED DURING THIS VISIT:  ED Discharge Orders    None       Note:  This document was prepared using Dragon voice recognition software and may include unintentional dictation errors.    Alfred Levins, [place], MD 03/08/19 [phone]

## 2019-03-08 NOTE — ED Triage Notes (Signed)
Pt reports for the past month she has had urinary frequency with burning, pt started an antibiotic, states it helped a little, states for the past 3 days the burning returned so she started using yeast cream, pt states now she is weak and at time dizzy

## 2019-03-08 NOTE — Discharge Instructions (Signed)
Your urine test shows you probably have a urinary tract infection, which is causing a small amount of bleeding into the urine as well.  Take keflex as prescribed to help with this, and follow up with your doctor in a week.

## 2019-03-09 LAB — URINE CULTURE: Culture: 20000 — AB

## 2019-03-11 DIAGNOSIS — J188 Other pneumonia, unspecified organism: Secondary | ICD-10-CM | POA: Diagnosis not present

## 2019-03-11 DIAGNOSIS — J449 Chronic obstructive pulmonary disease, unspecified: Secondary | ICD-10-CM | POA: Diagnosis not present

## 2019-03-13 DIAGNOSIS — I10 Essential (primary) hypertension: Secondary | ICD-10-CM | POA: Diagnosis not present

## 2019-03-13 DIAGNOSIS — J439 Emphysema, unspecified: Secondary | ICD-10-CM | POA: Diagnosis not present

## 2019-03-13 DIAGNOSIS — I4891 Unspecified atrial fibrillation: Secondary | ICD-10-CM | POA: Diagnosis not present

## 2019-03-13 DIAGNOSIS — C3490 Malignant neoplasm of unspecified part of unspecified bronchus or lung: Secondary | ICD-10-CM | POA: Diagnosis not present

## 2019-03-13 DIAGNOSIS — J9611 Chronic respiratory failure with hypoxia: Secondary | ICD-10-CM | POA: Diagnosis not present

## 2019-03-17 DIAGNOSIS — J188 Other pneumonia, unspecified organism: Secondary | ICD-10-CM | POA: Diagnosis not present

## 2019-03-17 DIAGNOSIS — J9610 Chronic respiratory failure, unspecified whether with hypoxia or hypercapnia: Secondary | ICD-10-CM | POA: Diagnosis not present

## 2019-03-17 DIAGNOSIS — J449 Chronic obstructive pulmonary disease, unspecified: Secondary | ICD-10-CM | POA: Diagnosis not present

## 2019-04-10 DIAGNOSIS — J188 Other pneumonia, unspecified organism: Secondary | ICD-10-CM | POA: Diagnosis not present

## 2019-04-10 DIAGNOSIS — J449 Chronic obstructive pulmonary disease, unspecified: Secondary | ICD-10-CM | POA: Diagnosis not present

## 2019-04-16 DIAGNOSIS — J188 Other pneumonia, unspecified organism: Secondary | ICD-10-CM | POA: Diagnosis not present

## 2019-04-16 DIAGNOSIS — J9610 Chronic respiratory failure, unspecified whether with hypoxia or hypercapnia: Secondary | ICD-10-CM | POA: Diagnosis not present

## 2019-04-16 DIAGNOSIS — J449 Chronic obstructive pulmonary disease, unspecified: Secondary | ICD-10-CM | POA: Diagnosis not present

## 2019-04-18 ENCOUNTER — Ambulatory Visit: Payer: Self-pay | Admitting: Adult Health

## 2019-04-22 ENCOUNTER — Other Ambulatory Visit: Payer: Self-pay

## 2019-04-22 MED ORDER — SYMBICORT 80-4.5 MCG/ACT IN AERO: 2.0000 | INHALATION_SPRAY | Freq: Two times a day (BID) | RESPIRATORY_TRACT | 3 refills | Status: DC

## 2019-04-30 ENCOUNTER — Other Ambulatory Visit: Payer: Self-pay

## 2019-04-30 ENCOUNTER — Ambulatory Visit: Payer: Medicare Other | Admitting: Internal Medicine

## 2019-04-30 ENCOUNTER — Encounter: Payer: Self-pay | Admitting: Internal Medicine

## 2019-04-30 VITALS — BP 142/60 | HR 80 | Resp 18 | Ht <= 58 in | Wt 115.0 lb

## 2019-04-30 DIAGNOSIS — R0602 Shortness of breath: Secondary | ICD-10-CM

## 2019-04-30 DIAGNOSIS — J449 Chronic obstructive pulmonary disease, unspecified: Secondary | ICD-10-CM

## 2019-04-30 DIAGNOSIS — C3412 Malignant neoplasm of upper lobe, left bronchus or lung: Secondary | ICD-10-CM

## 2019-04-30 DIAGNOSIS — K219 Gastro-esophageal reflux disease without esophagitis: Secondary | ICD-10-CM | POA: Diagnosis not present

## 2019-04-30 NOTE — Progress Notes (Signed)
[facility] [place], [place] [address]  Pulmonary Sleep Medicine   Office Visit Note  Patient Name: Bethany Lambert DOB: 06/27/1934 MRN [id number]  Date of Service: 04/30/2019  Complaints/HPI: She states that her breathing has been doing fairly well. She has had some issues with vertigo. She had this worked up and is on meclizine. Patient still has dizziness noted on occasion. As far as her breathing is concerned she has done well. She is on oxygen and uses it as prescribed. Some cough noted no hemoptysis noted. She has some SOB wih exertion noted Spirometry shows mild reduction in her FEV1. She has oral surgery planned for her teeth which of course she would be at increased risk however certainly she will benefit from the surgery from a ID point of view   ROS  General: (-) fever, (-) chills, (-) night sweats, (-) weakness Skin: (-) rashes, (-) itching,. Eyes: (-) visual changes, (-) redness, (-) itching. Nose and Sinuses: (-) nasal stuffiness or itchiness, (-) postnasal drip, (-) nosebleeds, (-) sinus trouble. Mouth and Throat: (-) sore throat, (-) hoarseness. Neck: (-) swollen glands, (-) enlarged thyroid, (-) neck pain. Respiratory: + cough, (-) bloody sputum, + shortness of breath, - wheezing. Cardiovascular: - ankle swelling, (-) chest pain. Lymphatic: (-) lymph node enlargement. Neurologic: (-) numbness, (-) tingling. Psychiatric: (-) anxiety, (-) depression   Current Medication: Outpatient Encounter Medications as of 04/30/2019  Medication Sig  . albuterol (PROVENTIL HFA;VENTOLIN HFA) 108 (90 Base) MCG/ACT inhaler Inhale 2 puffs into the lungs every 6 (six) hours as needed for wheezing.  Marland Kitchen ALPRAZolam (XANAX) 0.25 MG tablet Take 0.25 mg by mouth at bedtime as needed for anxiety.  . Cyanocobalamin (VITAMIN B-12 IJ) Inject 1,000 mcg as directed every 30 (thirty) days.   Marland Kitchen diltiazem (CARDIZEM CD) 120 MG 24 hr capsule   . docusate sodium (COLACE) 100  MG capsule Take 100-200 mg by mouth 2 (two) times daily. 100 mg in the morning and 200 mg at bedtime  . fluocinonide (LIDEX) 0.05 % external solution Apply 1 application topically 2 (two) times daily.   Marland Kitchen ipratropium-albuterol (DUONEB) 0.5-2.5 (3) MG/3ML SOLN Take 3 mLs by nebulization every 6 (six) hours as needed (wheezing).   Marland Kitchen loratadine (CLARITIN) 10 MG tablet Take 1 tablet (10 mg total) by mouth daily.  . Melatonin 5 MG TABS Take 1 tablet by mouth daily.  . metoprolol (LOPRESSOR) 50 MG tablet Take 1 tablet (50 mg total) by mouth 2 (two) times daily.  Marland Kitchen omeprazole (PRILOSEC) 20 MG capsule Take 20 mg by mouth 2 (two) times daily before a meal.   . OXYGEN Inhale 3 L into the lungs continuous.   . potassium chloride (K-DUR) 10 MEQ tablet 10 mEq daily.   . Prenatal Vit-Fe Fumarate-FA (PRENATAL MULTIVITAMIN) TABS tablet Take 1 tablet by mouth daily at 12 noon.  . SYMBICORT 80-4.5 MCG/ACT inhaler Inhale 2 puffs into the lungs 2 (two) times daily.  . [DISCONTINUED] cephALEXin (KEFLEX) 500 MG capsule Take 1 capsule (500 mg total) by mouth 2 (two) times daily. (Patient not taking: Reported on 04/30/2019)   No facility-administered encounter medications on file as of 04/30/2019.     Surgical History: Past Surgical History:  Procedure Laterality Date  . CATARACT EXTRACTION    . COLON RESECTION    . CT GUIDED BIOPSY  ([facility] HX)  11/19/2012   lung  . HERNIA REPAIR    . TOTAL ABDOMINAL HYSTERECTOMY      Medical  History: Past Medical History:  Diagnosis Date  . COPD (chronic obstructive pulmonary disease) (HCC)    on 2L home o2  . Dizziness   . Erythrocytosis   . GERD (gastroesophageal reflux disease)   . History of chemotherapy   . History of radiation therapy   . Hyperlipidemia   . Hypertension   . Kidney failure   . Lung cancer ([place])    squamous, stage 3 non small cell lung ca  . Polycythemia   . Psoriasis     Family History: Family History  Problem Relation Age of Onset  .  Heart attack Father   . Colon cancer Father   . Stroke Mother   . Cancer Son     Social History: Social History   Socioeconomic History  . Marital status: Widowed    Spouse name: Not on file  . Number of children: Not on file  . Years of education: Not on file  . Highest education level: Not on file  Occupational History  . Not on file  Social Needs  . Financial resource strain: Not on file  . Food insecurity:    Worry: Not on file    Inability: Not on file  . Transportation needs:    Medical: Not on file    Non-medical: Not on file  Tobacco Use  . Smoking status: Former Smoker    Packs/day: 2.00    Years: 40.00    Pack years: 80.00    Types: Cigarettes    Last attempt to quit: 06/22/2001    Years since quitting: 17.8  . Smokeless tobacco: Never Used  Substance and Sexual Activity  . Alcohol use: No    Alcohol/week: 0.0 standard drinks  . Drug use: No  . Sexual activity: Not on file  Lifestyle  . Physical activity:    Days per week: Not on file    Minutes per session: Not on file  . Stress: Not on file  Relationships  . Social connections:    Talks on phone: Not on file    Gets together: Not on file    Attends religious service: Not on file    Active member of club or organization: Not on file    Attends meetings of clubs or organizations: Not on file    Relationship status: Not on file  . Intimate partner violence:    Fear of current or ex partner: Not on file    Emotionally abused: Not on file    Physically abused: Not on file    Forced sexual activity: Not on file  Other Topics Concern  . Not on file  Social History Narrative   Lives at home with her son.  Has a walker but mostly gets around without it.    Vital Signs: Blood pressure (!) 142/60, pulse 80, resp. rate 18, height 4\' 10"  (1.473 m), weight 115 lb (52.2 kg), SpO2 99 %.  Examination: General Appearance: The patient is well-developed, well-nourished, and in no distress. Skin: Gross  inspection of skin unremarkable. Head: normocephalic, no gross deformities. Eyes: no gross deformities noted. ENT: ears appear grossly normal no exudates. Neck: Supple. No thyromegaly. No LAD. Respiratory: no rhonchi noted at this time. Cardiovascular: Normal S1 and S2 without murmur or rub. Extremities: No cyanosis. pulses are equal. Neurologic: Alert and oriented. No involuntary movements.  LABS: Recent Results (from the past 2160 hour(s))  Comprehensive metabolic panel     Status: Abnormal   Collection Time: 03/08/19  1:03 PM  Result Value Ref Range   Sodium 130 (L) 135 - 145 mmol/L   Potassium 3.8 3.5 - 5.1 mmol/L   Chloride 90 (L) 98 - 111 mmol/L   CO2 26 22 - 32 mmol/L   Glucose, Bld 143 (H) 70 - 99 mg/dL   BUN 12 8 - 23 mg/dL   Creatinine, Ser 0.85 0.44 - 1.00 mg/dL   Calcium 9.0 8.9 - 10.3 mg/dL   Total Protein 6.6 6.5 - 8.1 g/dL   Albumin 3.7 3.5 - 5.0 g/dL   AST 29 15 - 41 U/L   ALT 24 0 - 44 U/L   Alkaline Phosphatase 69 38 - 126 U/L   Total Bilirubin 0.5 0.3 - 1.2 mg/dL   GFR calc non Af Amer >60 >60 mL/min   GFR calc Af Amer >60 >60 mL/min   Anion gap 14 5 - 15    Comment: Performed at [facility], [address] [place], [place] [address]  CBC     Status: Abnormal   Collection Time: 03/08/19  1:03 PM  Result Value Ref Range   WBC 6.1 4.0 - 10.5 K/uL   RBC 3.52 (L) 3.87 - 5.11 MIL/uL   Hemoglobin 11.1 (L) 12.0 - 15.0 g/dL   HCT 31.9 (L) 36.0 - 46.0 %   MCV 90.6 80.0 - 100.0 fL   MCH 31.5 26.0 - 34.0 pg   MCHC 34.8 30.0 - 36.0 g/dL   RDW 13.4 11.5 - 15.5 %   Platelets 242 150 - 400 K/uL   nRBC 0.0 0.0 - 0.2 %    Comment: Performed at [facility] [address] [place], [place] [address]  Urinalysis, Routine w reflex microscopic     Status: Abnormal   Collection Time: 03/08/19  1:03 PM  Result Value Ref Range   Color, Urine STRAW (A) YELLOW   APPearance CLOUDY (A) CLEAR   Specific Gravity, Urine 1.005 1.005 - 1.030   pH 7.0 5.0  - 8.0   Glucose, UA NEGATIVE NEGATIVE mg/dL   Hgb urine dipstick MODERATE (A) NEGATIVE   Bilirubin Urine NEGATIVE NEGATIVE   Ketones, ur NEGATIVE NEGATIVE mg/dL   Protein, ur NEGATIVE NEGATIVE mg/dL   Nitrite NEGATIVE NEGATIVE   Leukocytes,Ua MODERATE (A) NEGATIVE   RBC / HPF 21-50 0 - 5 RBC/hpf   WBC, UA 0-5 0 - 5 WBC/hpf   Bacteria, UA NONE SEEN NONE SEEN   Squamous Epithelial / LPF 0-5 0 - 5   Mucus PRESENT    Non Squamous Epithelial PRESENT (A) NONE SEEN    Comment: Performed at [facility], [address] [place], [place] [address]  Urine Culture     Status: Abnormal   Collection Time: 03/08/19  4:04 PM  Result Value Ref Range   Specimen Description      URINE, RANDOM Performed at [facility], [address] [place], [place] [address]    Special Requests      NONE Performed at [facility], [place], [place] [address]    Culture (A)     20,000 COLONIES/mL GROUP B STREP(S.AGALACTIAE)ISOLATED TESTING AGAINST S. AGALACTIAE NOT ROUTINELY PERFORMED DUE TO PREDICTABILITY OF AMP/PEN/VAN SUSCEPTIBILITY. Performed at [facility], [place] [address], [place],  [address]    Report Status 03/09/2019 FINAL     Radiology: No results found.  No results found.  No results found.    Assessment and Plan: Patient Active Problem List   Diagnosis Date  Noted  . Hyponatremia 05/31/2018  . Pneumonia 05/18/2018  . Pancreatic cyst 07/10/2017  . COPD exacerbation ([place]) 09/09/2016  . GERD (gastroesophageal reflux disease) 09/09/2016  . HTN (hypertension) 09/09/2016  . Cancer of upper lobe of left lung ([place]) 07/06/2016  . Tachycardia 02/03/2013  . Dyspnea 02/03/2013    1. COPD mild by spirometry she needs to have complete PFT done prior to surgery 2. Left Lung cancer followed by the oncologist 3. SOB as noted above needs follow up PFTs to be done 4. GERD now is stable will monitor  General Counseling: I have discussed  the findings of the evaluation and examination with [facility].  I have also discussed any further diagnostic evaluation thatmay be needed or ordered today. Alyzabeth verbalizes understanding of the findings of todays visit. We also reviewed her medications today and discussed drug interactions and side effects including but not limited excessive drowsiness and altered mental states. We also discussed that there is always a risk not just to her but also people around her. she has been encouraged to call the office with any questions or concerns that should arise related to todays visit.    Time spent: 64min  I have personally obtained a history, examined the patient, evaluated laboratory and imaging results, formulated the assessment and plan and placed orders.    Allyne Gee, MD [facility] Pulmonary and Critical Care Sleep medicine

## 2019-04-30 NOTE — Patient Instructions (Signed)
Chronic Obstructive Pulmonary Disease Chronic obstructive pulmonary disease (COPD) is a long-term (chronic) lung problem. When you have COPD, it is hard for air to get in and out of your lungs. Usually the condition gets worse over time, and your lungs will never return to normal. There are things you can do to keep yourself as healthy as possible.  Your doctor may treat your condition with: ? Medicines. ? Oxygen. ? Lung surgery.  Your doctor may also recommend: ? Rehabilitation. This includes steps to make your body work better. It may involve a team of specialists. ? Quitting smoking, if you smoke. ? Exercise and changes to your diet. ? Comfort measures (palliative care). Follow these instructions at home: Medicines  Take over-the-counter and prescription medicines only as told by your doctor.  Talk to your doctor before taking any cough or allergy medicines. You may need to avoid medicines that cause your lungs to be dry. Lifestyle  If you smoke, stop. Smoking makes the problem worse. If you need help quitting, ask your doctor.  Avoid being around things that make your breathing worse. This may include smoke, chemicals, and fumes.  Stay active, but remember to rest as well.  Learn and use tips on how to relax.  Make sure you get enough sleep. Most adults need at least 7 hours of sleep every night.  Eat healthy foods. Eat smaller meals more often. Rest before meals. Controlled breathing Learn and use tips on how to control your breathing as told by your doctor. Try:  Breathing in (inhaling) through your nose for 1 second. Then, pucker your lips and breath out (exhale) through your lips for 2 seconds.  Putting one hand on your belly (abdomen). Breathe in slowly through your nose for 1 second. Your hand on your belly should move out. Pucker your lips and breathe out slowly through your lips. Your hand on your belly should move in as you breathe out.  Controlled coughing Learn  and use controlled coughing to clear mucus from your lungs. Follow these steps: 1. Lean your head a little forward. 2. Breathe in deeply. 3. Try to hold your breath for 3 seconds. 4. Keep your mouth slightly open while coughing 2 times. 5. Spit any mucus out into a tissue. 6. Rest and do the steps again 1 or 2 times as needed. General instructions  Make sure you get all the shots (vaccines) that your doctor recommends. Ask your doctor about a flu shot and a pneumonia shot.  Use oxygen therapy and pulmonary rehabilitation if told by your doctor. If you need home oxygen therapy, ask your doctor if you should buy a tool to measure your oxygen level (oximeter).  Make a COPD action plan with your doctor. This helps you to know what to do if you feel worse than usual.  Manage any other conditions you have as told by your doctor.  Avoid going outside when it is very hot, cold, or humid.  Avoid people who have a sickness you can catch (contagious).  Keep all follow-up visits as told by your doctor. This is important. Contact a doctor if:  You cough up more mucus than usual.  There is a change in the color or thickness of the mucus.  It is harder to breathe than usual.  Your breathing is faster than usual.  You have trouble sleeping.  You need to use your medicines more often than usual.  You have trouble doing your normal activities such as getting dressed   or walking around the house. Get help right away if:  You have shortness of breath while resting.  You have shortness of breath that stops you from: ? Being able to talk. ? Doing normal activities.  Your chest hurts for longer than 5 minutes.  Your skin color is more blue than usual.  Your pulse oximeter shows that you have low oxygen for longer than 5 minutes.  You have a fever.  You feel too tired to breathe normally. Summary  Chronic obstructive pulmonary disease (COPD) is a long-term lung problem.  The way your  lungs work will never return to normal. Usually the condition gets worse over time. There are things you can do to keep yourself as healthy as possible.  Take over-the-counter and prescription medicines only as told by your doctor.  If you smoke, stop. Smoking makes the problem worse. This information is not intended to replace advice given to you by your health care provider. Make sure you discuss any questions you have with your health care provider. Document Released: 05/09/2008 Document Revised: 12/26/2016 Document Reviewed: 12/26/2016 Elsevier Interactive Patient Education  2019 [employer]vier Inc.  

## 2019-05-09 ENCOUNTER — Ambulatory Visit: Payer: Self-pay | Admitting: Internal Medicine

## 2019-05-10 DIAGNOSIS — R300 Dysuria: Secondary | ICD-10-CM | POA: Diagnosis not present

## 2019-05-10 DIAGNOSIS — I10 Essential (primary) hypertension: Secondary | ICD-10-CM | POA: Diagnosis not present

## 2019-05-10 DIAGNOSIS — R040 Epistaxis: Secondary | ICD-10-CM | POA: Diagnosis not present

## 2019-05-10 DIAGNOSIS — R42 Dizziness and giddiness: Secondary | ICD-10-CM | POA: Diagnosis not present

## 2019-05-11 DIAGNOSIS — J188 Other pneumonia, unspecified organism: Secondary | ICD-10-CM | POA: Diagnosis not present

## 2019-05-11 DIAGNOSIS — J449 Chronic obstructive pulmonary disease, unspecified: Secondary | ICD-10-CM | POA: Diagnosis not present

## 2019-05-17 DIAGNOSIS — J9610 Chronic respiratory failure, unspecified whether with hypoxia or hypercapnia: Secondary | ICD-10-CM | POA: Diagnosis not present

## 2019-05-17 DIAGNOSIS — J449 Chronic obstructive pulmonary disease, unspecified: Secondary | ICD-10-CM | POA: Diagnosis not present

## 2019-05-17 DIAGNOSIS — J188 Other pneumonia, unspecified organism: Secondary | ICD-10-CM | POA: Diagnosis not present

## 2019-06-05 DIAGNOSIS — C3490 Malignant neoplasm of unspecified part of unspecified bronchus or lung: Secondary | ICD-10-CM | POA: Diagnosis not present

## 2019-06-05 DIAGNOSIS — I10 Essential (primary) hypertension: Secondary | ICD-10-CM | POA: Diagnosis not present

## 2019-06-05 DIAGNOSIS — M79674 Pain in right toe(s): Secondary | ICD-10-CM | POA: Diagnosis not present

## 2019-06-05 DIAGNOSIS — J9611 Chronic respiratory failure with hypoxia: Secondary | ICD-10-CM | POA: Diagnosis not present

## 2019-06-05 DIAGNOSIS — B351 Tinea unguium: Secondary | ICD-10-CM | POA: Diagnosis not present

## 2019-06-05 DIAGNOSIS — J439 Emphysema, unspecified: Secondary | ICD-10-CM | POA: Diagnosis not present

## 2019-06-05 DIAGNOSIS — M79675 Pain in left toe(s): Secondary | ICD-10-CM | POA: Diagnosis not present

## 2019-06-10 DIAGNOSIS — J449 Chronic obstructive pulmonary disease, unspecified: Secondary | ICD-10-CM | POA: Diagnosis not present

## 2019-06-10 DIAGNOSIS — J188 Other pneumonia, unspecified organism: Secondary | ICD-10-CM | POA: Diagnosis not present

## 2019-06-16 DIAGNOSIS — J9610 Chronic respiratory failure, unspecified whether with hypoxia or hypercapnia: Secondary | ICD-10-CM | POA: Diagnosis not present

## 2019-06-16 DIAGNOSIS — J188 Other pneumonia, unspecified organism: Secondary | ICD-10-CM | POA: Diagnosis not present

## 2019-06-16 DIAGNOSIS — J449 Chronic obstructive pulmonary disease, unspecified: Secondary | ICD-10-CM | POA: Diagnosis not present

## 2019-07-09 NOTE — Telephone Encounter (Signed)
Close encounter 

## 2019-07-11 DIAGNOSIS — J449 Chronic obstructive pulmonary disease, unspecified: Secondary | ICD-10-CM | POA: Diagnosis not present

## 2019-07-11 DIAGNOSIS — J188 Other pneumonia, unspecified organism: Secondary | ICD-10-CM | POA: Diagnosis not present

## 2019-07-17 DIAGNOSIS — J449 Chronic obstructive pulmonary disease, unspecified: Secondary | ICD-10-CM | POA: Diagnosis not present

## 2019-07-17 DIAGNOSIS — J188 Other pneumonia, unspecified organism: Secondary | ICD-10-CM | POA: Diagnosis not present

## 2019-07-17 DIAGNOSIS — J9610 Chronic respiratory failure, unspecified whether with hypoxia or hypercapnia: Secondary | ICD-10-CM | POA: Diagnosis not present

## 2019-07-22 ENCOUNTER — Ambulatory Visit
Admission: RE | Admit: 2019-07-22 | Discharge: 2019-07-22 | Disposition: A | Discharge status: Home / Self Care | Payer: Medicare Other | Source: Ambulatory Visit | Attending: Internal Medicine | Admitting: Internal Medicine

## 2019-07-22 ENCOUNTER — Other Ambulatory Visit: Payer: Self-pay

## 2019-07-22 DIAGNOSIS — I700 Atherosclerosis of aorta: Secondary | ICD-10-CM | POA: Diagnosis not present

## 2019-07-22 DIAGNOSIS — C3412 Malignant neoplasm of upper lobe, left bronchus or lung: Secondary | ICD-10-CM | POA: Insufficient documentation

## 2019-07-22 DIAGNOSIS — I2510 Atherosclerotic heart disease of native coronary artery without angina pectoris: Secondary | ICD-10-CM | POA: Diagnosis not present

## 2019-07-22 DIAGNOSIS — R918 Other nonspecific abnormal finding of lung field: Secondary | ICD-10-CM | POA: Diagnosis not present

## 2019-07-22 DIAGNOSIS — J432 Centrilobular emphysema: Secondary | ICD-10-CM | POA: Diagnosis not present

## 2019-07-22 DIAGNOSIS — K8689 Other specified diseases of pancreas: Secondary | ICD-10-CM | POA: Diagnosis not present

## 2019-07-22 HISTORY — DX: Malignant neoplasm of upper lobe, left bronchus or lung: C34.12

## 2019-07-22 LAB — POCT I-STAT CREATININE: Creatinine, Ser: 0.9 mg/dL (ref 0.44–1.00)

## 2019-07-22 MED ORDER — IOHEXOL 300 MG/ML  SOLN
60.0000 mL | Freq: Once | INTRAMUSCULAR | Status: AC | PRN
  Administered 2019-07-22: 60 mL via INTRAVENOUS

## 2019-07-23 ENCOUNTER — Other Ambulatory Visit: Payer: Self-pay | Admitting: *Deleted

## 2019-07-23 ENCOUNTER — Other Ambulatory Visit: Payer: Self-pay

## 2019-07-23 DIAGNOSIS — C3412 Malignant neoplasm of upper lobe, left bronchus or lung: Secondary | ICD-10-CM

## 2019-07-24 ENCOUNTER — Other Ambulatory Visit: Payer: Self-pay

## 2019-07-24 ENCOUNTER — Other Ambulatory Visit: Payer: Self-pay | Admitting: Internal Medicine

## 2019-07-24 ENCOUNTER — Inpatient Hospital Stay: Payer: Medicare Other | Attending: Internal Medicine

## 2019-07-24 ENCOUNTER — Inpatient Hospital Stay: Payer: Medicare Other | Admitting: Internal Medicine

## 2019-07-24 ENCOUNTER — Telehealth: Payer: Self-pay | Admitting: Internal Medicine

## 2019-07-24 DIAGNOSIS — D649 Anemia, unspecified: Secondary | ICD-10-CM | POA: Insufficient documentation

## 2019-07-24 DIAGNOSIS — C3412 Malignant neoplasm of upper lobe, left bronchus or lung: Secondary | ICD-10-CM | POA: Insufficient documentation

## 2019-07-24 DIAGNOSIS — I10 Essential (primary) hypertension: Secondary | ICD-10-CM | POA: Diagnosis not present

## 2019-07-24 DIAGNOSIS — J449 Chronic obstructive pulmonary disease, unspecified: Secondary | ICD-10-CM | POA: Diagnosis not present

## 2019-07-24 DIAGNOSIS — Z87891 Personal history of nicotine dependence: Secondary | ICD-10-CM | POA: Insufficient documentation

## 2019-07-24 DIAGNOSIS — Z923 Personal history of irradiation: Secondary | ICD-10-CM | POA: Diagnosis not present

## 2019-07-24 DIAGNOSIS — Z9981 Dependence on supplemental oxygen: Secondary | ICD-10-CM | POA: Diagnosis not present

## 2019-07-24 DIAGNOSIS — E785 Hyperlipidemia, unspecified: Secondary | ICD-10-CM | POA: Insufficient documentation

## 2019-07-24 DIAGNOSIS — Z7951 Long term (current) use of inhaled steroids: Secondary | ICD-10-CM | POA: Diagnosis not present

## 2019-07-24 DIAGNOSIS — Z79899 Other long term (current) drug therapy: Secondary | ICD-10-CM | POA: Diagnosis not present

## 2019-07-24 DIAGNOSIS — Z9221 Personal history of antineoplastic chemotherapy: Secondary | ICD-10-CM | POA: Diagnosis not present

## 2019-07-24 LAB — COMPREHENSIVE METABOLIC PANEL WITH GFR
ALT: 24 U/L (ref 0–44)
AST: 25 U/L (ref 15–41)
Albumin: 3.3 g/dL — ABNORMAL LOW (ref 3.5–5.0)
Alkaline Phosphatase: 72 U/L (ref 38–126)
Anion gap: 9 (ref 5–15)
BUN: 12 mg/dL (ref 8–23)
CO2: 28 mmol/L (ref 22–32)
Calcium: 8.1 mg/dL — ABNORMAL LOW (ref 8.9–10.3)
Chloride: 103 mmol/L (ref 98–111)
Creatinine, Ser: 0.88 mg/dL (ref 0.44–1.00)
GFR calc Af Amer: >60 mL/min (ref >60)
GFR calc non Af Amer: >60 mL/min (ref >60)
Glucose, Bld: 132 mg/dL — ABNORMAL HIGH (ref 70–99)
Potassium: 3.3 mmol/L — ABNORMAL LOW (ref 3.5–5.1)
Sodium: 140 mmol/L (ref 135–145)
Total Bilirubin: 0.5 mg/dL (ref 0.3–1.2)
Total Protein: 6 g/dL — ABNORMAL LOW (ref 6.5–8.1)

## 2019-07-24 LAB — CBC WITH DIFFERENTIAL/PLATELET
Abs Immature Granulocytes: 0.02 K/uL (ref 0.00–0.07)
Basophils Absolute: 0 K/uL (ref 0.0–0.1)
Basophils Relative: 1 %
Eosinophils Absolute: 0.1 K/uL (ref 0.0–0.5)
Eosinophils Relative: 2 %
HCT: 27.6 % — ABNORMAL LOW (ref 36.0–46.0)
Hemoglobin: 9.2 g/dL — ABNORMAL LOW (ref 12.0–15.0)
Immature Granulocytes: 0 %
Lymphocytes Relative: 19 %
Lymphs Abs: 1.2 K/uL (ref 0.7–4.0)
MCH: 31.5 pg (ref 26.0–34.0)
MCHC: 33.3 g/dL (ref 30.0–36.0)
MCV: 94.5 fL (ref 80.0–100.0)
Monocytes Absolute: 0.5 K/uL (ref 0.1–1.0)
Monocytes Relative: 7 %
Neutro Abs: 4.5 K/uL (ref 1.7–7.7)
Neutrophils Relative %: 71 %
Platelets: 175 K/uL (ref 150–400)
RBC: 2.92 MIL/uL — ABNORMAL LOW (ref 3.87–5.11)
RDW: 13.9 % (ref 11.5–15.5)
WBC: 6.3 K/uL (ref 4.0–10.5)
nRBC: 0 % (ref 0.0–0.2)

## 2019-07-24 NOTE — Assessment & Plan Note (Addendum)
#  Right upper lobe lung nodule [11 x 8 mm] s/p SBRT in end of July 2017.  CT scan-August 2020-negative for any recurrence.   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  Stable.  See above  # Pancreatic cyst-February 2019 MRI abdomen shows multiple cysts in the pancreas-pseudocyst-not concerning for malignancy.  CT August 2020-stable. will repeat MRI in spring 2021./Will order at next visit.  #Chronic anemia-normocytic; however worsening 9.2 today.  No recent colonoscopy.  04/12/2011 no iron deficiency noted.  Will repeat iron studies/LDH.  Will discuss regarding bone marrow biopsy.  #COPD chronic.  Stable/continue follow-up with Dr.Khan.  # I LVM for her daughter, Q[place]man Livings call us back to discuss her mom's care.  # DISPOSITION: add iron studies/ferritin/ ldh today.  # follow up in 3 months- MD- cbc/cmp; b21/folate/ haptoglobin-Dr.B  c; Dr.Johnston/ Dr.Khan

## 2019-07-24 NOTE — Progress Notes (Signed)
[facility] OFFICE PROGRESS NOTE  Patient Care Team: Baxter Hire, [name]  Cancer Staging No matching staging information was found for the patient.   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  # COPD Home O2 2.5L -  # 2019-2020-mild anemia- Hb-10; no IDA; colo-"many years ago".   # DIAGNOSIS: Lung cancer- LUL stage III/ RUL stage I  GOALS: cuartive  CURRENT/MOST RECENT THERAPY; surveillaince    Cancer of upper lobe of left lung (HCC)    INTERVAL HISTORY:  [facility] MEINERS89y.o.  female pleasant patient above history of lung cancer/COPD pancreatic cysts is here for follow-up.   Patient continues to feel poorly overall.  She continues to chronic shortness of breath chronic cough.  No hemoptysis.  She continues to be on 2 to 3 L of home O2 all the time.  She is interested in having her dental extraction done however concerned about anesthesia.  Denies any blood in stools or black-colored stools.  No nausea no vomiting abdominal pain.  Review of Systems  Constitutional: Positive for malaise/fatigue. Negative for chills, diaphoresis, fever and weight loss.  HENT: Negative for nosebleeds and sore throat.   Eyes: Negative for double vision.  Respiratory: Positive for cough and shortness of breath. Negative for hemoptysis and sputum production.   Cardiovascular: Negative for chest pain, palpitations, orthopnea and leg swelling.  Gastrointestinal: Negative for abdominal pain, blood in stool, constipation, diarrhea, heartburn, melena, nausea and vomiting.   Genitourinary: Negative for dysuria, frequency and urgency.  Musculoskeletal: Positive for back pain and joint pain.  Skin: Negative.  Negative for itching and rash.  Neurological: Negative for dizziness, tingling, focal weakness, weakness and headaches.  Endo/Heme/Allergies: Does not bruise/bleed easily.  Psychiatric/Behavioral: Negative for depression. The patient is not nervous/anxious and does not have insomnia.     PAST MEDICAL HISTORY :  Past Medical History:  Diagnosis Date  . 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 COPD (chronic obstructive pulmonary disease) (HCC)    on 2L home o2  . Dizziness   . Erythrocytosis   . GERD (gastroesophageal reflux disease)   . History of chemotherapy   . History of radiation therapy   . Hyperlipidemia   . Hypertension   . Kidney failure   . Polycythemia   . Psoriasis     PAST SURGICAL HISTORY :   Past Surgical History:  Procedure Laterality Date  . CATARACT EXTRACTION    . COLON RESECTION    . CT GUIDED BIOPSY  ([facility] HX)  11/19/2012   lung  . HERNIA REPAIR    . TOTAL ABDOMINAL HYSTERECTOMY      FAMILY HISTORY :   Family History  Problem Relation Age of Onset  . Heart attack Father   . Colon cancer Father   . Stroke Mother   . Cancer Son     SOCIAL HISTORY:   Social History   Tobacco Use  . Smoking status: Former Smoker    Packs/day: 2.00  Years: 40.00    Pack years: 80.00    Types: Cigarettes    Quit date: 06/22/2001    Years since quitting: 18.0  . Smokeless tobacco: Never Used  Substance Use Topics  . Alcohol use: No    Alcohol/week: 0.0 standard drinks  . Drug use: No    ALLERGIES:  is allergic to ciprofloxacin and norvasc [amlodipine besylate].  MEDICATIONS:  Current Outpatient Medications  Medication Sig Dispense Refill  . albuterol (PROVENTIL HFA;VENTOLIN HFA) 108 (90 Base) MCG/ACT inhaler Inhale 2 puffs into the lungs every 6 (six) hours as needed for  wheezing. 2 Inhaler 5  . ALPRAZolam (XANAX) 0.25 MG tablet Take 0.25 mg by mouth at bedtime as needed for anxiety.    . Cyanocobalamin (VITAMIN B-12 IJ) Inject 1,000 mcg as directed every 30 (thirty) days.     Marland Kitchen diltiazem (CARDIZEM CD) 120 MG 24 hr capsule   2  . docusate sodium (COLACE) 100 MG capsule Take 100-200 mg by mouth 2 (two) times daily. 100 mg in the morning and 200 mg at bedtime    . fluocinonide (LIDEX) 0.05 % external solution Apply 1 application topically 2 (two) times daily.     Marland Kitchen ipratropium-albuterol (DUONEB) 0.5-2.5 (3) MG/3ML SOLN Take 3 mLs by nebulization every 6 (six) hours as needed (wheezing).     Marland Kitchen loratadine (CLARITIN) 10 MG tablet Take 1 tablet (10 mg total) by mouth daily. 30 tablet 0  . Melatonin 5 MG TABS Take 1 tablet by mouth daily.    . metoprolol (LOPRESSOR) 50 MG tablet Take 1 tablet (50 mg total) by mouth 2 (two) times daily.    Marland Kitchen omeprazole (PRILOSEC) 20 MG capsule Take 20 mg by mouth 2 (two) times daily before a meal.     . OXYGEN Inhale 3 L into the lungs continuous.     . potassium chloride (K-DUR) 10 MEQ tablet 10 mEq daily.   11  . Prenatal Vit-Fe Fumarate-FA (PRENATAL MULTIVITAMIN) TABS tablet Take 1 tablet by mouth daily at 12 noon.    . SYMBICORT 80-4.5 MCG/ACT inhaler Inhale 2 puffs into the lungs 2 (two) times daily. 1 Inhaler 3   No current facility-administered medications for this visit.     PHYSICAL EXAMINATION: ECOG PERFORMANCE STATUS: 1 - Symptomatic but completely ambulatory  BP (!) 169/64 (BP Location: Left Arm, Patient Position: Sitting, Cuff Size: Normal)   Pulse 72   Temp (!) 95.6 F (35.3 C) (Tympanic)   Wt 121 lb (54.9 kg)   BMI 25.29 kg/m   Filed Weights   07/24/19 1354  Weight: 121 lb (54.9 kg)    Physical Exam  Constitutional: She is oriented to person, place, and time.  Thin built Caucasian female patient.  She is alone.  She is in a wheelchair.  2.5 L of oxygen.  HENT:  Head: Normocephalic and atraumatic.   Mouth/Throat: Oropharynx is clear and moist. No oropharyngeal exudate.  Eyes: Pupils are equal, round, and reactive to light.  Neck: Normal range of motion. Neck supple.  Cardiovascular: Normal rate and regular rhythm.  Pulmonary/Chest: No respiratory distress. She has no wheezes.  Decreased air entry bilaterally.  Abdominal: Soft. Bowel sounds are normal. She exhibits no distension and no mass. There is no abdominal tenderness. There is no rebound and no guarding.  Musculoskeletal: Normal range of motion.        General: No tenderness or edema.  Neurological: She is alert and oriented to person, place, and time.  Skin: Skin is  warm.  Psychiatric: Affect normal.       LABORATORY DATA:  I have reviewed the data as listed    Component Value Date/Time   NA 140 07/24/2019 1335   NA 136 12/29/2014 0827   K 3.3 (L) 07/24/2019 1335   K 4.0 12/29/2014 0827   CL 103 07/24/2019 1335   CL 102 12/29/2014 0827   CO2 28 07/24/2019 1335   CO2 28 12/29/2014 0827   GLUCOSE 132 (H) 07/24/2019 1335   GLUCOSE 115 (H) 12/29/2014 0827   BUN 12 07/24/2019 1335   BUN 15 12/29/2014 0827   CREATININE 0.88 07/24/2019 1335   CREATININE 1.23 12/29/2014 0827   CALCIUM 8.1 (L) 07/24/2019 1335   CALCIUM 8.8 12/29/2014 0827   PROT 6.0 (L) 07/24/2019 1335   PROT 6.3 (L) 12/01/2014 1355   ALBUMIN 3.3 (L) 07/24/2019 1335   ALBUMIN 3.2 (L) 12/01/2014 1355   AST 25 07/24/2019 1335   AST 14 (L) 12/01/2014 1355   ALT 24 07/24/2019 1335   ALT 22 12/01/2014 1355   ALKPHOS 72 07/24/2019 1335   ALKPHOS 100 12/01/2014 1355   BILITOT 0.5 07/24/2019 1335   BILITOT 0.3 12/01/2014 1355   GFRNONAA >60 07/24/2019 1335   GFRNONAA 45 (L) 12/29/2014 0827   GFRNONAA 42 (L) 08/13/2014 1020   GFRAA >60 07/24/2019 1335   GFRAA 54 (L) 12/29/2014 0827   GFRAA 49 (L) 08/13/2014 1020    No results found for: SPEP, UPEP  Lab Results  Component Value Date   WBC 6.3 07/24/2019   NEUTROABS 4.5 07/24/2019   HGB 9.2  (L) 07/24/2019   HCT 27.6 (L) 07/24/2019   MCV 94.5 07/24/2019   PLT 175 07/24/2019      Chemistry      Component Value Date/Time   NA 140 07/24/2019 1335   NA 136 12/29/2014 0827   K 3.3 (L) 07/24/2019 1335   K 4.0 12/29/2014 0827   CL 103 07/24/2019 1335   CL 102 12/29/2014 0827   CO2 28 07/24/2019 1335   CO2 28 12/29/2014 0827   BUN 12 07/24/2019 1335   BUN 15 12/29/2014 0827   CREATININE 0.88 07/24/2019 1335   CREATININE 1.23 12/29/2014 0827      Component Value Date/Time   CALCIUM 8.1 (L) 07/24/2019 1335   CALCIUM 8.8 12/29/2014 0827   ALKPHOS 72 07/24/2019 1335   ALKPHOS 100 12/01/2014 1355   AST 25 07/24/2019 1335   AST 14 (L) 12/01/2014 1355   ALT 24 07/24/2019 1335   ALT 22 12/01/2014 1355   BILITOT 0.5 07/24/2019 1335   BILITOT 0.3 12/01/2014 1355       RADIOGRAPHIC STUDIES: I have personally reviewed the radiological images as listed and agreed with the findings in the report. No results found.   ASSESSMENT & PLAN:  Cancer of upper lobe of left lung ([place]) # Right upper lobe lung nodule [11 x 8 mm] s/p SBRT in end of July 2017.  CT scan-August 2020-negative for any recurrence.   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  Stable.  See above  # Pancreatic cyst-February 2019 MRI abdomen shows multiple cysts in the pancreas-pseudocyst-not concerning for malignancy.  CT August 2020-stable. will repeat MRI in spring 2021./Will order at next visit.  #Chronic anemia-normocytic; however worsening 9.2 today.  No recent colonoscopy.  04/12/2011 no iron deficiency noted.  Will repeat iron studies/LDH.  Will discuss regarding bone marrow biopsy.  #COPD chronic.  Stable/continue follow-up with Dr.Khan.  # I LVM for her daughter, [place] Livings call us back to discuss her mom's care.  # DISPOSITION: add iron studies/ferritin/ ldh today.  # follow up in 3 months- MD- cbc/cmp; b21/folate/  haptoglobin-Dr.B  c; Dr.Johnston/ Dr.Khan   Orders Placed This Encounter  Procedures  . CBC with Differential/Platelet    Standing Status:   Future    Standing Expiration Date:   07/23/2020  . Comprehensive metabolic panel    Standing Status:   Future    Standing Expiration Date:   07/23/2020  . Vitamin B12    Standing Status:   Future    Standing Expiration Date:   07/23/2020  . Folate    Standing Status:   Future    Standing Expiration Date:   07/23/2020  . Haptoglobin    Standing Status:   Future    Standing Expiration Date:   07/23/2020   All questions were answered. The patient knows to call the clinic with any problems, questions or concerns.      Cammie Sickle, MD 07/24/2019 2:24 PM

## 2019-07-24 NOTE — Telephone Encounter (Signed)
Message left for pt's daughter, [facility] Lambert re: her mothers plan of care- plan to follow up in 3 months/ re: anemia work up. Asked to call back if questions

## 2019-07-26 DIAGNOSIS — I10 Essential (primary) hypertension: Secondary | ICD-10-CM | POA: Diagnosis not present

## 2019-08-02 DIAGNOSIS — I4891 Unspecified atrial fibrillation: Secondary | ICD-10-CM | POA: Diagnosis not present

## 2019-08-02 DIAGNOSIS — C3490 Malignant neoplasm of unspecified part of unspecified bronchus or lung: Secondary | ICD-10-CM | POA: Diagnosis not present

## 2019-08-02 DIAGNOSIS — Z0001 Encounter for general adult medical examination with abnormal findings: Secondary | ICD-10-CM | POA: Diagnosis not present

## 2019-08-02 DIAGNOSIS — J439 Emphysema, unspecified: Secondary | ICD-10-CM | POA: Diagnosis not present

## 2019-08-02 DIAGNOSIS — I10 Essential (primary) hypertension: Secondary | ICD-10-CM | POA: Diagnosis not present

## 2019-08-11 DIAGNOSIS — J188 Other pneumonia, unspecified organism: Secondary | ICD-10-CM | POA: Diagnosis not present

## 2019-08-11 DIAGNOSIS — J449 Chronic obstructive pulmonary disease, unspecified: Secondary | ICD-10-CM | POA: Diagnosis not present

## 2019-08-15 DIAGNOSIS — Z1211 Encounter for screening for malignant neoplasm of colon: Secondary | ICD-10-CM | POA: Diagnosis not present

## 2019-08-20 ENCOUNTER — Other Ambulatory Visit: Payer: Self-pay

## 2019-08-20 ENCOUNTER — Ambulatory Visit: Payer: Medicare Other | Admitting: Internal Medicine

## 2019-08-20 ENCOUNTER — Encounter: Payer: Self-pay | Admitting: Internal Medicine

## 2019-08-20 VITALS — BP 126/60 | HR 84 | Resp 16 | Ht <= 58 in | Wt 115.0 lb

## 2019-08-20 DIAGNOSIS — J449 Chronic obstructive pulmonary disease, unspecified: Secondary | ICD-10-CM

## 2019-08-20 DIAGNOSIS — K219 Gastro-esophageal reflux disease without esophagitis: Secondary | ICD-10-CM

## 2019-08-20 DIAGNOSIS — Z9981 Dependence on supplemental oxygen: Secondary | ICD-10-CM

## 2019-08-20 DIAGNOSIS — R0602 Shortness of breath: Secondary | ICD-10-CM | POA: Diagnosis not present

## 2019-08-20 DIAGNOSIS — I10 Essential (primary) hypertension: Secondary | ICD-10-CM | POA: Diagnosis not present

## 2019-08-20 NOTE — Progress Notes (Signed)
[facility] [place], [place] [address]  Pulmonary Sleep Medicine   Office Visit Note  Patient Name: Bethany Lambert DOB: [birth year] MRN [id number]  Date of Service: 08/20/2019  Complaints/HPI: PT is here for follow up.  She reports overall she is doing fair.  She reports her breathing has been slowly getting worse.  She is on 2-3 lpm of oxygen continuously.  She does not have any coughing at this time.  She reports DOE, but denies hemoptysis or fever.    ROS  General: (-) fever, (-) chills, (-) night sweats, (-) weakness Skin: (-) rashes, (-) itching,. Eyes: (-) visual changes, (-) redness, (-) itching. Nose and Sinuses: (-) nasal stuffiness or itchiness, (-) postnasal drip, (-) nosebleeds, (-) sinus trouble. Mouth and Throat: (-) sore throat, (-) hoarseness. Neck: (-) swollen glands, (-) enlarged thyroid, (-) neck pain. Respiratory: - cough, (-) bloody sputum, + shortness of breath, - wheezing. Cardiovascular: - ankle swelling, (-) chest pain. Lymphatic: (-) lymph node enlargement. Neurologic: (-) numbness, (-) tingling. Psychiatric: (-) anxiety, (-) depression   Current Medication: Outpatient Encounter Medications as of 08/20/2019  Medication Sig  . albuterol (PROVENTIL HFA;VENTOLIN HFA) 108 (90 Base) MCG/ACT inhaler Inhale 2 puffs into the lungs every 6 (six) hours as needed for wheezing.  Marland Kitchen ALPRAZolam (XANAX) 0.25 MG tablet Take 0.25 mg by mouth at bedtime as needed for anxiety.  . busPIRone (BUSPAR) 5 MG tablet Take by mouth.  . Cyanocobalamin (VITAMIN B-12 IJ) Inject 1,000 mcg as directed every 30 (thirty) days.   Marland Kitchen diltiazem (CARDIZEM CD) 120 MG 24 hr capsule   . docusate sodium (COLACE) 100 MG capsule Take 100-200 mg by mouth 2 (two) times daily. 100 mg in the morning and 200 mg at bedtime  . fluocinonide (LIDEX) 0.05 % external solution Apply 1 application topically 2 (two) times daily.   Marland Kitchen ipratropium-albuterol (DUONEB) 0.5-2.5 (3) MG/3ML  SOLN Take 3 mLs by nebulization every 6 (six) hours as needed (wheezing).   Marland Kitchen loratadine (CLARITIN) 10 MG tablet Take 1 tablet (10 mg total) by mouth daily.  Marland Kitchen losartan (COZAAR) 50 MG tablet Take by mouth.  . Melatonin 5 MG TABS Take 1 tablet by mouth daily.  . metoprolol (LOPRESSOR) 50 MG tablet Take 1 tablet (50 mg total) by mouth 2 (two) times daily.  Marland Kitchen omeprazole (PRILOSEC) 20 MG capsule Take 20 mg by mouth 2 (two) times daily before a meal.   . OXYGEN Inhale 3 L into the lungs continuous.   . potassium chloride (K-DUR) 10 MEQ tablet 10 mEq daily.   . Prenatal Vit-Fe Fumarate-FA (PRENATAL MULTIVITAMIN) TABS tablet Take 1 tablet by mouth daily at 12 noon.  . SYMBICORT 80-4.5 MCG/ACT inhaler Inhale 2 puffs into the lungs 2 (two) times daily.   No facility-administered encounter medications on file as of 08/20/2019.     Surgical History: Past Surgical History:  Procedure Laterality Date  . CATARACT EXTRACTION    . COLON RESECTION    . CT GUIDED BIOPSY  ([facility] HX)  11/19/2012   lung  . HERNIA REPAIR    . TOTAL ABDOMINAL HYSTERECTOMY      Medical History: Past Medical History:  Diagnosis Date  . 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 COPD (chronic obstructive pulmonary disease) (HCC)    on 2L home o2  . Dizziness   . Erythrocytosis   . GERD (gastroesophageal reflux disease)   .  History of chemotherapy   . History of radiation therapy   . Hyperlipidemia   . Hypertension   . Kidney failure   . Polycythemia   . Psoriasis     Family History: Family History  Problem Relation Age of Onset  . Heart attack Father   . Colon cancer Father   . Stroke Mother   . Cancer Son     Social History: Social History   Socioeconomic History  . Marital status: Widowed    Spouse name: Not on file  . Number of children: Not on file  . Years of education: Not on file  . Highest education level: Not on file  Occupational History  . Not  on file  Social Needs  . Financial resource strain: Not on file  . Food insecurity    Worry: Not on file    Inability: Not on file  . Transportation needs    Medical: Not on file    Non-medical: Not on file  Tobacco Use  . Smoking status: Former Smoker    Packs/day: 2.00    Years: 40.00    Pack years: 80.00    Types: Cigarettes    Quit date: 06/22/2001    Years since quitting: 18.1  . Smokeless tobacco: Never Used  Substance and Sexual Activity  . Alcohol use: No    Alcohol/week: 0.0 standard drinks  . Drug use: No  . Sexual activity: Not on file  Lifestyle  . Physical activity    Days per week: Not on file    Minutes per session: Not on file  . Stress: Not on file  Relationships  . Social Herbalist on phone: Not on file    Gets together: Not on file    Attends religious service: Not on file    Active member of club or organization: Not on file    Attends meetings of clubs or organizations: Not on file    Relationship status: Not on file  . Intimate partner violence    Fear of current or ex partner: Not on file    Emotionally abused: Not on file    Physically abused: Not on file    Forced sexual activity: Not on file  Other Topics Concern  . Not on file  Social History Narrative   Lives at home with her son.  Has a walker but mostly gets around without it.    Vital Signs: Blood pressure 126/60, pulse 84, resp. rate 16, height 4\' 10"  (1.473 m), weight 115 lb (52.2 kg), SpO2 97 %.  Examination: General Appearance: The patient is well-developed, well-nourished, and in no distress. Skin: Gross inspection of skin unremarkable. Head: normocephalic, no gross deformities. Eyes: no gross deformities noted. ENT: ears appear grossly normal no exudates. Neck: Supple. No thyromegaly. No LAD. Respiratory: clear, diminished. Cardiovascular: Normal S1 and S2 without murmur or rub. Extremities: No cyanosis. pulses are equal. Neurologic: Alert and oriented. No  involuntary movements.  LABS: Recent Results (from the past 2160 hour(s))  I-STAT creatinine     Status: None   Collection Time: 07/22/19  9:33 AM  Result Value Ref Range   Creatinine, Ser 0.90 0.44 - 1.00 mg/dL  Comprehensive metabolic panel     Status: Abnormal   Collection Time: 07/24/19  1:35 PM  Result Value Ref Range   Sodium 140 135 - 145 mmol/L   Potassium 3.3 (L) 3.5 - 5.1 mmol/L   Chloride 103 98 - 111 mmol/L  CO2 28 22 - 32 mmol/L   Glucose, Bld 132 (H) 70 - 99 mg/dL   BUN 12 8 - 23 mg/dL   Creatinine, Ser 0.88 0.44 - 1.00 mg/dL   Calcium 8.1 (L) 8.9 - 10.3 mg/dL   Total Protein 6.0 (L) 6.5 - 8.1 g/dL   Albumin 3.3 (L) 3.5 - 5.0 g/dL   AST 25 15 - 41 U/L   ALT 24 0 - 44 U/L   Alkaline Phosphatase 72 38 - 126 U/L   Total Bilirubin 0.5 0.3 - 1.2 mg/dL   GFR calc non Af Amer >60 >60 mL/min   GFR calc Af Amer >60 >60 mL/min   Anion gap 9 5 - 15    Comment: Performed at [facility], [address] [place], [place] [address]  CBC with Differential     Status: Abnormal   Collection Time: 07/24/19  1:35 PM  Result Value Ref Range   WBC 6.3 4.0 - 10.5 K/uL   RBC 2.92 (L) 3.87 - 5.11 MIL/uL   Hemoglobin 9.2 (L) 12.0 - 15.0 g/dL   HCT 27.6 (L) 36.0 - 46.0 %   MCV 94.5 80.0 - 100.0 fL   MCH 31.5 26.0 - 34.0 pg   MCHC 33.3 30.0 - 36.0 g/dL   RDW 13.9 11.5 - 15.5 %   Platelets 175 150 - 400 K/uL   nRBC 0.0 0.0 - 0.2 %   Neutrophils Relative % 71 %   Neutro Abs 4.5 1.7 - 7.7 K/uL   Lymphocytes Relative 19 %   Lymphs Abs 1.2 0.7 - 4.0 K/uL   Monocytes Relative 7 %   Monocytes Absolute 0.5 0.1 - 1.0 K/uL   Eosinophils Relative 2 %   Eosinophils Absolute 0.1 0.0 - 0.5 K/uL   Basophils Relative 1 %   Basophils Absolute 0.0 0.0 - 0.1 K/uL   Immature Granulocytes 0 %   Abs Immature Granulocytes 0.02 0.00 - 0.07 K/uL    Comment: Performed at [facility], [address], [place], [place] [address]    Radiology: Ct Chest W Contrast  Result Date:  07/22/2019 CLINICAL DATA:  Left upper lobe lung cancer. EXAM: CT CHEST WITH CONTRAST TECHNIQUE: Multidetector CT imaging of the chest was performed during intravenous contrast administration. CONTRAST:  35mL OMNIPAQUE IOHEXOL 300 MG/ML  SOLN COMPARISON:  07/04/2018 FINDINGS: Cardiovascular: The heart size is normal. No substantial pericardial effusion. Coronary artery calcification is evident. Atherosclerotic calcification is noted in the wall of the thoracic aorta. Mediastinum/Nodes: No mediastinal lymphadenopathy. Abnormal soft tissue in the suprahilar regions bilaterally similar to prior. 9 mm short axis right hilar lymph node measured previously is stable at 9 mm today. The esophagus has normal imaging features. There is no axillary lymphadenopathy. Lungs/Pleura: The central tracheobronchial airways are patent. Centrilobular and paraseptal emphysema evident. Parenchymal scarring in the suprahilar lungs is unchanged. 7 mm anterior right upper lobe sub solid peripheral nodule is stable ill-defined part solid nodule in the right middle lobe is stable. The solid component of this nodule measures 8 mm today, unchanged. 4 mm posterior right lower lobe nodule (85/3) is stable. Other scattered tiny bilateral pulmonary nodules are similar to prior. No new suspicious pulmonary nodule or mass. No pleural effusion. Upper Abdomen: Small cystic lesions in the tail of pancreas again noted. The more distal lesion is 1.6 cm today, not substantially changed from 1.5 cm previously. Smaller lesion measures 1.4 cm today, also stable. No dilatation of the main duct. Stable 1.4 cm  right adrenal nodule. Small hypoattenuating lesions in both kidneys are unchanged. Musculoskeletal: No worrisome lytic or sclerotic osseous abnormality. IMPRESSION: 1. Stable exam. No new or progressive findings. 2. Post treatment changes noted in the suprahilar region of both lungs, stable. 3. Bilateral pulmonary nodules demonstrate no substantial change,  including the dominant part solid right middle lobe nodule. Continued attention on follow-up recommended. 4. Emphysema. 5. Coronary artery and thoracoabdominal aortic atherosclerosis. 6. Stable appearance small cystic lesions in the tail of pancreas. Continued attention on follow-up recommended. Aortic Atherosclerosis (ICD10-I70.0) and Emphysema (ICD10-J43.9). Electronically Signed   By: Misty Stanley M.D.   On: 07/22/2019 10:28    No results found.  Ct Chest W Contrast  Result Date: 07/22/2019 CLINICAL DATA:  Left upper lobe lung cancer. EXAM: CT CHEST WITH CONTRAST TECHNIQUE: Multidetector CT imaging of the chest was performed during intravenous contrast administration. CONTRAST:  68mL OMNIPAQUE IOHEXOL 300 MG/ML  SOLN COMPARISON:  07/04/2018 FINDINGS: Cardiovascular: The heart size is normal. No substantial pericardial effusion. Coronary artery calcification is evident. Atherosclerotic calcification is noted in the wall of the thoracic aorta. Mediastinum/Nodes: No mediastinal lymphadenopathy. Abnormal soft tissue in the suprahilar regions bilaterally similar to prior. 9 mm short axis right hilar lymph node measured previously is stable at 9 mm today. The esophagus has normal imaging features. There is no axillary lymphadenopathy. Lungs/Pleura: The central tracheobronchial airways are patent. Centrilobular and paraseptal emphysema evident. Parenchymal scarring in the suprahilar lungs is unchanged. 7 mm anterior right upper lobe sub solid peripheral nodule is stable ill-defined part solid nodule in the right middle lobe is stable. The solid component of this nodule measures 8 mm today, unchanged. 4 mm posterior right lower lobe nodule (85/3) is stable. Other scattered tiny bilateral pulmonary nodules are similar to prior. No new suspicious pulmonary nodule or mass. No pleural effusion. Upper Abdomen: Small cystic lesions in the tail of pancreas again noted. The more distal lesion is 1.6 cm today, not  substantially changed from 1.5 cm previously. Smaller lesion measures 1.4 cm today, also stable. No dilatation of the main duct. Stable 1.4 cm right adrenal nodule. Small hypoattenuating lesions in both kidneys are unchanged. Musculoskeletal: No worrisome lytic or sclerotic osseous abnormality. IMPRESSION: 1. Stable exam. No new or progressive findings. 2. Post treatment changes noted in the suprahilar region of both lungs, stable. 3. Bilateral pulmonary nodules demonstrate no substantial change, including the dominant part solid right middle lobe nodule. Continued attention on follow-up recommended. 4. Emphysema. 5. Coronary artery and thoracoabdominal aortic atherosclerosis. 6. Stable appearance small cystic lesions in the tail of pancreas. Continued attention on follow-up recommended. Aortic Atherosclerosis (ICD10-I70.0) and Emphysema (ICD10-J43.9). Electronically Signed   By: Misty Stanley M.D.   On: 07/22/2019 10:28      Assessment and Plan: Patient Active Problem List   Diagnosis Date Noted  . Normocytic anemia 07/24/2019  . Hyponatremia 05/31/2018  . Pneumonia 05/18/2018  . Pancreatic cyst 07/10/2017  . COPD exacerbation ([place]) 09/09/2016  . GERD (gastroesophageal reflux disease) 09/09/2016  . HTN (hypertension) 09/09/2016  . Cancer of upper lobe of left lung ([place]) 07/06/2016  . Tachycardia 02/03/2013  . Dyspnea 02/03/2013    1. COPD, severe ([place]) Stable, continue present management.  2. Oxygen dependent Continue to use oxygen 2-3 LPM   3. Gastroesophageal reflux disease without esophagitis Stable, continue present management.   4. Hypertension, unspecified type Stable, continue present management.   5. SOB (shortness of breath) Unable to do spiro due to equipment issues.  -  Spirometry with Graph  General Counseling: I have discussed the findings of the evaluation and examination with Clatie.  I have also discussed any further diagnostic evaluation thatmay be needed or  ordered today. Niralya verbalizes understanding of the findings of todays visit. We also reviewed her medications today and discussed drug interactions and side effects including but not limited excessive drowsiness and altered mental states. We also discussed that there is always a risk not just to her but also people around her. she has been encouraged to call the office with any questions or concerns that should arise related to todays visit.    Time spent: 20 This patient was seen by Orson Gear AGNP-C in Collaboration with Dr. Devona Konig as a part of collaborative care agreement.   I have personally obtained a history, examined the patient, evaluated laboratory and imaging results, formulated the assessment and plan and placed orders.    Allyne Gee, MD [facility] Pulmonary and Critical Care Sleep medicine

## 2019-09-05 ENCOUNTER — Telehealth: Payer: Self-pay

## 2019-09-05 NOTE — Telephone Encounter (Signed)
Gave [employer] orders for oxygen DME, pt moving and needs to [employer] from [employer] to [place], pt may need new testing, we will send orders and wait on response. Put copy in scan. Beth

## 2019-09-09 DIAGNOSIS — J449 Chronic obstructive pulmonary disease, unspecified: Secondary | ICD-10-CM | POA: Diagnosis not present

## 2019-09-10 DIAGNOSIS — J449 Chronic obstructive pulmonary disease, unspecified: Secondary | ICD-10-CM | POA: Diagnosis not present

## 2019-09-10 DIAGNOSIS — J188 Other pneumonia, unspecified organism: Secondary | ICD-10-CM | POA: Diagnosis not present

## 2019-09-11 ENCOUNTER — Telehealth: Payer: Self-pay

## 2019-09-11 NOTE — Telephone Encounter (Signed)
CMN SIGNED AND PLACED IN [place] FOLDER. °

## 2019-10-02 ENCOUNTER — Telehealth: Payer: Self-pay

## 2019-10-02 ENCOUNTER — Other Ambulatory Visit: Payer: Self-pay | Admitting: Internal Medicine

## 2019-10-02 NOTE — Telephone Encounter (Signed)
Patient received her oxygen from [place],  We faxed a d.c. order and pick up all equipment to [place] and stop any billing and pick up. Beth

## 2019-10-08 ENCOUNTER — Other Ambulatory Visit: Payer: Self-pay | Admitting: Adult Health

## 2019-10-08 MED ORDER — SYMBICORT 80-4.5 MCG/ACT IN AERO: 2.0000 | INHALATION_SPRAY | Freq: Two times a day (BID) | RESPIRATORY_TRACT | 4 refills | Status: DC

## 2019-10-10 DIAGNOSIS — J449 Chronic obstructive pulmonary disease, unspecified: Secondary | ICD-10-CM | POA: Diagnosis not present

## 2019-10-17 DIAGNOSIS — R413 Other amnesia: Secondary | ICD-10-CM | POA: Diagnosis not present

## 2019-10-17 DIAGNOSIS — Z87891 Personal history of nicotine dependence: Secondary | ICD-10-CM | POA: Diagnosis not present

## 2019-10-17 DIAGNOSIS — I10 Essential (primary) hypertension: Secondary | ICD-10-CM | POA: Diagnosis not present

## 2019-10-17 DIAGNOSIS — R531 Weakness: Secondary | ICD-10-CM | POA: Diagnosis not present

## 2019-10-17 DIAGNOSIS — Z23 Encounter for immunization: Secondary | ICD-10-CM | POA: Diagnosis not present

## 2019-10-22 ENCOUNTER — Other Ambulatory Visit: Payer: Self-pay

## 2019-10-22 ENCOUNTER — Encounter: Payer: Self-pay | Admitting: Internal Medicine

## 2019-10-22 NOTE — Progress Notes (Signed)
Patient pre screened for office appointment, no questions or concerns today. Patient reminded of upcoming appointment time and date. 

## 2019-10-23 ENCOUNTER — Inpatient Hospital Stay (HOSPITAL_BASED_OUTPATIENT_CLINIC_OR_DEPARTMENT_OTHER): Payer: Medicare Other | Admitting: Internal Medicine

## 2019-10-23 ENCOUNTER — Other Ambulatory Visit: Payer: Self-pay

## 2019-10-23 ENCOUNTER — Inpatient Hospital Stay: Payer: Medicare Other | Attending: Internal Medicine

## 2019-10-23 VITALS — BP 140/59 | HR 66 | Temp 98.5°F | Resp 20 | Wt 110.0 lb

## 2019-10-23 DIAGNOSIS — D649 Anemia, unspecified: Secondary | ICD-10-CM | POA: Insufficient documentation

## 2019-10-23 DIAGNOSIS — E785 Hyperlipidemia, unspecified: Secondary | ICD-10-CM | POA: Insufficient documentation

## 2019-10-23 DIAGNOSIS — Z79899 Other long term (current) drug therapy: Secondary | ICD-10-CM | POA: Diagnosis not present

## 2019-10-23 DIAGNOSIS — C3412 Malignant neoplasm of upper lobe, left bronchus or lung: Secondary | ICD-10-CM | POA: Diagnosis not present

## 2019-10-23 DIAGNOSIS — J449 Chronic obstructive pulmonary disease, unspecified: Secondary | ICD-10-CM | POA: Insufficient documentation

## 2019-10-23 DIAGNOSIS — Z923 Personal history of irradiation: Secondary | ICD-10-CM | POA: Insufficient documentation

## 2019-10-23 DIAGNOSIS — Z9221 Personal history of antineoplastic chemotherapy: Secondary | ICD-10-CM | POA: Insufficient documentation

## 2019-10-23 DIAGNOSIS — R634 Abnormal weight loss: Secondary | ICD-10-CM | POA: Diagnosis not present

## 2019-10-23 DIAGNOSIS — K219 Gastro-esophageal reflux disease without esophagitis: Secondary | ICD-10-CM | POA: Diagnosis not present

## 2019-10-23 DIAGNOSIS — K862 Cyst of pancreas: Secondary | ICD-10-CM | POA: Diagnosis not present

## 2019-10-23 DIAGNOSIS — R630 Anorexia: Secondary | ICD-10-CM | POA: Insufficient documentation

## 2019-10-23 DIAGNOSIS — R42 Dizziness and giddiness: Secondary | ICD-10-CM | POA: Diagnosis not present

## 2019-10-23 DIAGNOSIS — Z8249 Family history of ischemic heart disease and other diseases of the circulatory system: Secondary | ICD-10-CM | POA: Diagnosis not present

## 2019-10-23 DIAGNOSIS — M549 Dorsalgia, unspecified: Secondary | ICD-10-CM | POA: Insufficient documentation

## 2019-10-23 DIAGNOSIS — I10 Essential (primary) hypertension: Secondary | ICD-10-CM | POA: Diagnosis not present

## 2019-10-23 DIAGNOSIS — E871 Hypo-osmolality and hyponatremia: Secondary | ICD-10-CM | POA: Insufficient documentation

## 2019-10-23 DIAGNOSIS — R5383 Other fatigue: Secondary | ICD-10-CM | POA: Insufficient documentation

## 2019-10-23 DIAGNOSIS — Z7951 Long term (current) use of inhaled steroids: Secondary | ICD-10-CM | POA: Insufficient documentation

## 2019-10-23 DIAGNOSIS — Z87891 Personal history of nicotine dependence: Secondary | ICD-10-CM | POA: Diagnosis not present

## 2019-10-23 DIAGNOSIS — M2550 Pain in unspecified joint: Secondary | ICD-10-CM | POA: Diagnosis not present

## 2019-10-23 LAB — COMPREHENSIVE METABOLIC PANEL
ALT: 25 U/L (ref 0–44)
AST: 22 U/L (ref 15–41)
Albumin: 3.6 g/dL (ref 3.5–5.0)
Alkaline Phosphatase: 76 U/L (ref 38–126)
Anion gap: 7 (ref 5–15)
BUN: 14 mg/dL (ref 8–23)
CO2: 26 mmol/L (ref 22–32)
Calcium: 8.8 mg/dL — ABNORMAL LOW (ref 8.9–10.3)
Chloride: 91 mmol/L — ABNORMAL LOW (ref 98–111)
Creatinine, Ser: 0.86 mg/dL (ref 0.44–1.00)
GFR calc Af Amer: >60 mL/min (ref >60)
GFR calc non Af Amer: >60 mL/min (ref >60)
Glucose, Bld: 120 mg/dL — ABNORMAL HIGH (ref 70–99)
Potassium: 4.3 mmol/L (ref 3.5–5.1)
Sodium: 124 mmol/L — ABNORMAL LOW (ref 135–145)
Total Bilirubin: 0.6 mg/dL (ref 0.3–1.2)
Total Protein: 6.6 g/dL (ref 6.5–8.1)

## 2019-10-23 LAB — CBC WITH DIFFERENTIAL/PLATELET
Abs Immature Granulocytes: 0.02 10*3/uL (ref 0.00–0.07)
Basophils Absolute: 0 10*3/uL (ref 0.0–0.1)
Basophils Relative: 1 %
Eosinophils Absolute: 0.1 10*3/uL (ref 0.0–0.5)
Eosinophils Relative: 1 %
HCT: 28.8 % — ABNORMAL LOW (ref 36.0–46.0)
Hemoglobin: 10.1 g/dL — ABNORMAL LOW (ref 12.0–15.0)
Immature Granulocytes: 0 %
Lymphocytes Relative: 20 %
Lymphs Abs: 1.4 10*3/uL (ref 0.7–4.0)
MCH: 31.6 pg (ref 26.0–34.0)
MCHC: 35.1 g/dL (ref 30.0–36.0)
MCV: 90 fL (ref 80.0–100.0)
Monocytes Absolute: 0.5 10*3/uL (ref 0.1–1.0)
Monocytes Relative: 7 %
Neutro Abs: 5.1 10*3/uL (ref 1.7–7.7)
Neutrophils Relative %: 71 %
Platelets: 258 10*3/uL (ref 150–400)
RBC: 3.2 MIL/uL — ABNORMAL LOW (ref 3.87–5.11)
RDW: 12.3 % (ref 11.5–15.5)
WBC: 7.1 10*3/uL (ref 4.0–10.5)
nRBC: 0 % (ref 0.0–0.2)

## 2019-10-23 LAB — RETICULOCYTES
Immature Retic Fract: 4.6 % (ref 2.3–15.9)
RBC.: 3.2 MIL/uL — ABNORMAL LOW (ref 3.87–5.11)
Retic Count, Absolute: 34.9 10*3/uL (ref 19.0–186.0)
Retic Ct Pct: 1.1 % (ref 0.4–3.1)

## 2019-10-23 LAB — VITAMIN B12: Vitamin B-12: 6448 pg/mL — ABNORMAL HIGH (ref 180–914)

## 2019-10-23 LAB — FOLATE: Folate: 52.8 ng/mL (ref >5.9)

## 2019-10-23 LAB — TECHNOLOGIST SMEAR REVIEW
Plt Morphology: NORMAL
RBC Morphology: NORMAL

## 2019-10-23 NOTE — Assessment & Plan Note (Addendum)
#   Right upper lobe lung nodule [11 x 8 mm] s/p SBRT in end of July 2017.  CT scan-August 2020-negative for any recurrence.   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  AUG 2020-stable; FOLLOW UP IMAGING IN SUMMER 2021.   # Pancreatic cyst-February 2019 MRI abdomen shows multiple cysts in the pancreas-pseudocyst-not concerning for malignancy. Repeat in spring 2021.   #Chronic anemia-normocytic; hemoglobin 10.  Stable.  Monitor for now/this patient the context of multiple medical problems.  # Hyponatremia- sodium 124; baseline 140- ? Etiology- awaiting- repeat evaluation with Neurology next week.  Recommend free water restriction; increasing salt intake.   #COPD chronic.  Stable/continue follow-up with Dr.Khan.  # DISPOSITION:  # follow up in 3 months- MD- labs-cbc/cmp; MRI abdomen prior-Dr.B  c; Dr.Johnston/ Dr.Khan

## 2019-10-23 NOTE — Progress Notes (Signed)
[place] OFFICE PROGRESS NOTE  Patient Care Team: Baxter Hire, [name]  Cancer Staging No matching staging information was found for the patient.   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  # COPD Home O2 2.5L -  # 2019-2020-mild anemia- Hb-10; no IDA; colo-"many years ago".   # DIAGNOSIS: Lung cancer- LUL stage III/ RUL stage I  GOALS: cuartive  CURRENT/MOST RECENT THERAPY; surveillaince    Cancer of upper lobe of left lung (HCC)    INTERVAL HISTORY:  Bethany Lambert 85 y.o.  female pleasant patient above history of lung cancer/COPD pancreatic cysts is here for follow-up.   As per family patient noted to have worsening memory issues over the last few months.  Patient recently relocated to live with her daughter.  Patient continues a poor appetite; positive weight loss.  Patient continues to be on continues with oxygen all the time.  Chronic dizziness.  She continues to lose weight.  Chronic joint pain back pain.  Denies any worsening abdominal pain.   Review of Systems  Constitutional: Positive for malaise/fatigue. Negative for chills, diaphoresis, fever and weight loss.  HENT: Negative for nosebleeds and sore throat.   Eyes: Negative for double vision.  Respiratory: Positive for cough and shortness of breath. Negative for hemoptysis and sputum production.   Cardiovascular: Negative for chest pain, palpitations, orthopnea and leg swelling.  Gastrointestinal: Negative for abdominal pain, blood in stool, constipation, diarrhea, heartburn, melena,  nausea and vomiting.  Genitourinary: Negative for dysuria, frequency and urgency.  Musculoskeletal: Positive for back pain and joint pain.  Skin: Negative.  Negative for itching and rash.  Neurological: Negative for dizziness, tingling, focal weakness, weakness and headaches.  Endo/Heme/Allergies: Does not bruise/bleed easily.  Psychiatric/Behavioral: Negative for depression. The patient is not nervous/anxious and does not have insomnia.     PAST MEDICAL HISTORY :  Past Medical History:  Diagnosis Date  . 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 COPD (chronic obstructive pulmonary disease) (HCC)    on 2L home o2  . Dizziness   . Erythrocytosis   . GERD (gastroesophageal reflux disease)   . History of chemotherapy   . History of radiation therapy   . Hyperlipidemia   . Hypertension   . Kidney failure   . Polycythemia   . Psoriasis     PAST SURGICAL HISTORY :   Past Surgical History:  Procedure Laterality Date  . CATARACT EXTRACTION    . COLON RESECTION    . CT GUIDED BIOPSY  ([facility] HX)  11/19/2012   lung  . HERNIA REPAIR    . TOTAL ABDOMINAL HYSTERECTOMY      FAMILY HISTORY :   Family History  Problem Relation Age of Onset  . Heart attack Father   . Colon cancer Father   . Stroke Mother   . Cancer Son     SOCIAL HISTORY:   Social History   Tobacco Use  . Smoking status: Former Smoker    Packs/day:  2.00    Years: 40.00    Pack years: 80.00    Types: Cigarettes    Quit date: 06/22/2001    Years since quitting: 18.3  . Smokeless tobacco: Never Used  Substance Use Topics  . Alcohol use: No    Alcohol/week: 0.0 standard drinks  . Drug use: No    ALLERGIES:  is allergic to ciprofloxacin and norvasc [amlodipine besylate].  MEDICATIONS:  Current Outpatient Medications  Medication Sig Dispense Refill  . ALPRAZolam (XANAX) 0.25 MG tablet Take 0.25 mg by mouth at bedtime as needed for anxiety.    . busPIRone  (BUSPAR) 5 MG tablet Take by mouth.    . Cyanocobalamin (VITAMIN B-12 IJ) Inject 1,000 mcg as directed every 30 (thirty) days.     Marland Kitchen diltiazem (CARDIZEM CD) 120 MG 24 hr capsule   2  . docusate sodium (COLACE) 100 MG capsule Take 100-200 mg by mouth 2 (two) times daily. 100 mg in the morning and 200 mg at bedtime    . fluocinonide (LIDEX) 0.05 % external solution Apply 1 application topically 2 (two) times daily.     Marland Kitchen ipratropium-albuterol (DUONEB) 0.5-2.5 (3) MG/3ML SOLN Take 3 mLs by nebulization every 6 (six) hours as needed (wheezing).     Marland Kitchen loratadine (CLARITIN) 10 MG tablet Take 1 tablet (10 mg total) by mouth daily. 30 tablet 0  . losartan (COZAAR) 50 MG tablet Take by mouth.    . Melatonin 5 MG TABS Take 1 tablet by mouth daily.    . metoprolol (LOPRESSOR) 50 MG tablet Take 1 tablet (50 mg total) by mouth 2 (two) times daily.    Marland Kitchen omeprazole (PRILOSEC) 20 MG capsule Take 20 mg by mouth 2 (two) times daily before a meal.     . OXYGEN Inhale 3 L into the lungs continuous.     . potassium chloride (K-DUR) 10 MEQ tablet 10 mEq daily.   11  . Prenatal Vit-Fe Fumarate-FA (PRENATAL MULTIVITAMIN) TABS tablet Take 1 tablet by mouth daily at 12 noon.    Marland Kitchen PROAIR HFA 108 (90 Base) MCG/ACT inhaler INHALE 2 PUFFS INTO THE LUNGS EVERY 6 (SIX) HOURS AS NEEDED FOR WHEEZING. 18 g 2  . SYMBICORT 80-4.5 MCG/ACT inhaler Inhale 2 puffs into the lungs 2 (two) times daily. 1 Inhaler 4  . SPIRIVA HANDIHALER 18 MCG inhalation capsule Place 1 capsule into inhaler and inhale daily.     No current facility-administered medications for this visit.     PHYSICAL EXAMINATION: ECOG PERFORMANCE STATUS: 1 - Symptomatic but completely ambulatory  BP (!) 140/59 (BP Location: Left Arm, Patient Position: Sitting, Cuff Size: Normal)   Pulse 66   Temp 98.5 F (36.9 C) (Tympanic)   Resp 20   Wt 110 lb (49.9 kg)   BMI 22.99 kg/m   Filed Weights   10/23/19 1434  Weight: 110 lb (49.9 kg)    Physical Exam   Constitutional: She is oriented to person, place, and time.  Thin built Caucasian female patient.  She is alone.  She is in a wheelchair.  2.5 L of oxygen.  HENT:  Head: Normocephalic and atraumatic.  Mouth/Throat: Oropharynx is clear and moist. No oropharyngeal exudate.  Eyes: Pupils are equal, round, and reactive to light.  Neck: Normal range of motion. Neck supple.  Cardiovascular: Normal rate and regular rhythm.  Pulmonary/Chest: No respiratory distress. She has no wheezes.  Decreased air entry bilaterally.  Abdominal: Soft. Bowel sounds are normal. She exhibits no distension and no  mass. There is no abdominal tenderness. There is no rebound and no guarding.  Musculoskeletal: Normal range of motion.        General: No tenderness or edema.  Neurological: She is alert and oriented to person, place, and time.  Skin: Skin is warm.  Psychiatric: Affect normal.       LABORATORY DATA:  I have reviewed the data as listed    Component Value Date/Time   NA 124 (L) 10/23/2019 1416   NA 136 12/29/2014 0827   K 4.3 10/23/2019 1416   K 4.0 12/29/2014 0827   CL 91 (L) 10/23/2019 1416   CL 102 12/29/2014 0827   CO2 26 10/23/2019 1416   CO2 28 12/29/2014 0827   GLUCOSE 120 (H) 10/23/2019 1416   GLUCOSE 115 (H) 12/29/2014 0827   BUN 14 10/23/2019 1416   BUN 15 12/29/2014 0827   CREATININE 0.86 10/23/2019 1416   CREATININE 1.23 12/29/2014 0827   CALCIUM 8.8 (L) 10/23/2019 1416   CALCIUM 8.8 12/29/2014 0827   PROT 6.6 10/23/2019 1416   PROT 6.3 (L) 12/01/2014 1355   ALBUMIN 3.6 10/23/2019 1416   ALBUMIN 3.2 (L) 12/01/2014 1355   AST 22 10/23/2019 1416   AST 14 (L) 12/01/2014 1355   ALT 25 10/23/2019 1416   ALT 22 12/01/2014 1355   ALKPHOS 76 10/23/2019 1416   ALKPHOS 100 12/01/2014 1355   BILITOT 0.6 10/23/2019 1416   BILITOT 0.3 12/01/2014 1355   GFRNONAA >60 10/23/2019 1416   GFRNONAA 45 (L) 12/29/2014 0827   GFRNONAA 42 (L) 08/13/2014 1020   GFRAA >60 10/23/2019 1416    GFRAA 54 (L) 12/29/2014 0827   GFRAA 49 (L) 08/13/2014 1020    No results found for: SPEP, UPEP  Lab Results  Component Value Date   WBC 7.1 10/23/2019   NEUTROABS 5.1 10/23/2019   HGB 10.1 (L) 10/23/2019   HCT 28.8 (L) 10/23/2019   MCV 90.0 10/23/2019   PLT 258 10/23/2019      Chemistry      Component Value Date/Time   NA 124 (L) 10/23/2019 1416   NA 136 12/29/2014 0827   K 4.3 10/23/2019 1416   K 4.0 12/29/2014 0827   CL 91 (L) 10/23/2019 1416   CL 102 12/29/2014 0827   CO2 26 10/23/2019 1416   CO2 28 12/29/2014 0827   BUN 14 10/23/2019 1416   BUN 15 12/29/2014 0827   CREATININE 0.86 10/23/2019 1416   CREATININE 1.23 12/29/2014 0827      Component Value Date/Time   CALCIUM 8.8 (L) 10/23/2019 1416   CALCIUM 8.8 12/29/2014 0827   ALKPHOS 76 10/23/2019 1416   ALKPHOS 100 12/01/2014 1355   AST 22 10/23/2019 1416   AST 14 (L) 12/01/2014 1355   ALT 25 10/23/2019 1416   ALT 22 12/01/2014 1355   BILITOT 0.6 10/23/2019 1416   BILITOT 0.3 12/01/2014 1355       RADIOGRAPHIC STUDIES: I have personally reviewed the radiological images as listed and agreed with the findings in the report. No results found.   ASSESSMENT & PLAN:  Cancer of upper lobe of left lung ([place]) # Right upper lobe lung nodule [11 x 8 mm] s/p SBRT in end of July 2017.  CT scan-August 2020-negative for any recurrence.   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  AUG 2020-stable; FOLLOW UP IMAGING IN SUMMER 2021.   # Pancreatic cyst-February 2019 MRI abdomen shows multiple cysts  in the pancreas-pseudocyst-not concerning for malignancy. Repeat in spring 2021.   #Chronic anemia-normocytic; hemoglobin 10.  Stable.  Monitor for now/this patient the context of multiple medical problems.  # Hyponatremia- sodium 124; baseline 140- ? Etiology- awaiting- repeat evaluation with Neurology next week.  Recommend free water restriction;  increasing salt intake.   #COPD chronic.  Stable/continue follow-up with Dr.Khan.  # DISPOSITION:  # follow up in 3 months- MD- labs-cbc/cmp; MRI abdomen prior-Dr.B  c; Dr.Johnston/ Dr.Khan   Orders Placed This Encounter  Procedures  . MR Abdomen W Wo Contrast    Standing Status:   Future    Standing Expiration Date:   10/22/2020    Order Specific Question:   ** REASON FOR EXAM (FREE TEXT)    Answer:   pancreas cysts    Order Specific Question:   If indicated for the ordered procedure, I authorize the administration of contrast media per Radiology protocol    Answer:   Yes    Order Specific Question:   What is the patient's sedation requirement?    Answer:   No Sedation    Order Specific Question:   Does the patient have a pacemaker or implanted devices?    Answer:   No    Order Specific Question:   Radiology Contrast Protocol - do NOT remove file path    Answer:   [email or web address]    Order Specific Question:   Preferred imaging location?    Answer:   [facility] (table limit-400lbs)  . Comprehensive metabolic panel    Standing Status:   Future    Standing Expiration Date:   10/22/2020  . CBC with Differential    Standing Status:   Future    Standing Expiration Date:   10/22/2020   All questions were answered. The patient knows to call the clinic with any problems, questions or concerns.      Cammie Sickle, MD 10/25/2019 2:13 PM

## 2019-10-24 LAB — KAPPA/LAMBDA LIGHT CHAINS
Kappa free light chain: 28 mg/L — ABNORMAL HIGH (ref 3.3–19.4)
Kappa, lambda light chain ratio: 1.1 (ref 0.26–1.65)
Lambda free light chains: 25.4 mg/L (ref 5.7–26.3)

## 2019-10-24 LAB — HAPTOGLOBIN: Haptoglobin: 294 mg/dL (ref 41–333)

## 2019-10-25 LAB — MULTIPLE MYELOMA PANEL, SERUM
Albumin SerPl Elph-Mcnc: 3.3 g/dL (ref 2.9–4.4)
Albumin/Glob SerPl: 1.3 (ref 0.7–1.7)
Alpha 1: 0.3 g/dL (ref 0.0–0.4)
Alpha2 Glob SerPl Elph-Mcnc: 0.9 g/dL (ref 0.4–1.0)
B-Globulin SerPl Elph-Mcnc: 1.1 g/dL (ref 0.7–1.3)
Gamma Glob SerPl Elph-Mcnc: 0.5 g/dL (ref 0.4–1.8)
Globulin, Total: 2.7 g/dL (ref 2.2–3.9)
IgA: 282 mg/dL (ref 64–422)
IgG (Immunoglobin G), Serum: 563 mg/dL — ABNORMAL LOW (ref 586–1602)
IgM (Immunoglobulin M), Srm: 34 mg/dL (ref 26–217)
Total Protein ELP: 6 g/dL (ref 6.0–8.5)

## 2019-11-09 DIAGNOSIS — J449 Chronic obstructive pulmonary disease, unspecified: Secondary | ICD-10-CM | POA: Diagnosis not present

## 2019-12-04 ENCOUNTER — Telehealth: Payer: Self-pay

## 2019-12-04 NOTE — Telephone Encounter (Signed)
Patient rescheduled appointment on 12/16/2019 to 02/17/2020. klh

## 2019-12-10 DIAGNOSIS — J449 Chronic obstructive pulmonary disease, unspecified: Secondary | ICD-10-CM | POA: Diagnosis not present

## 2019-12-16 ENCOUNTER — Ambulatory Visit: Payer: Medicare Other | Admitting: Internal Medicine

## 2020-01-01 DIAGNOSIS — H903 Sensorineural hearing loss, bilateral: Secondary | ICD-10-CM | POA: Diagnosis not present

## 2020-01-10 DIAGNOSIS — J449 Chronic obstructive pulmonary disease, unspecified: Secondary | ICD-10-CM | POA: Diagnosis not present

## 2020-01-20 ENCOUNTER — Other Ambulatory Visit: Payer: Self-pay

## 2020-01-20 ENCOUNTER — Ambulatory Visit
Admission: RE | Admit: 2020-01-20 | Discharge: 2020-01-20 | Disposition: A | Discharge status: Home / Self Care | Payer: Medicare Other | Source: Ambulatory Visit | Attending: Internal Medicine | Admitting: Internal Medicine

## 2020-01-20 DIAGNOSIS — K862 Cyst of pancreas: Secondary | ICD-10-CM | POA: Insufficient documentation

## 2020-01-20 LAB — POCT I-STAT CREATININE: Creatinine, Ser: 0.9 mg/dL (ref 0.44–1.00)

## 2020-01-20 MED ORDER — GADOBUTROL 1 MMOL/ML IV SOLN
5.0000 mL | Freq: Once | INTRAVENOUS | Status: AC | PRN
  Administered 2020-01-20: 11:00:00 5 mL via INTRAVENOUS

## 2020-01-22 ENCOUNTER — Other Ambulatory Visit: Payer: Self-pay

## 2020-01-22 ENCOUNTER — Inpatient Hospital Stay: Payer: Medicare Other | Attending: Internal Medicine

## 2020-01-22 ENCOUNTER — Inpatient Hospital Stay (HOSPITAL_BASED_OUTPATIENT_CLINIC_OR_DEPARTMENT_OTHER): Payer: Medicare Other | Admitting: Internal Medicine

## 2020-01-22 VITALS — BP 167/64 | HR 64 | Temp 96.6°F | Wt 112.1 lb

## 2020-01-22 DIAGNOSIS — M549 Dorsalgia, unspecified: Secondary | ICD-10-CM | POA: Diagnosis not present

## 2020-01-22 DIAGNOSIS — Z8249 Family history of ischemic heart disease and other diseases of the circulatory system: Secondary | ICD-10-CM | POA: Insufficient documentation

## 2020-01-22 DIAGNOSIS — C3412 Malignant neoplasm of upper lobe, left bronchus or lung: Secondary | ICD-10-CM | POA: Insufficient documentation

## 2020-01-22 DIAGNOSIS — Z800 Family history of malignant neoplasm of digestive organs: Secondary | ICD-10-CM | POA: Diagnosis not present

## 2020-01-22 DIAGNOSIS — K219 Gastro-esophageal reflux disease without esophagitis: Secondary | ICD-10-CM | POA: Insufficient documentation

## 2020-01-22 DIAGNOSIS — K862 Cyst of pancreas: Secondary | ICD-10-CM

## 2020-01-22 DIAGNOSIS — Z9221 Personal history of antineoplastic chemotherapy: Secondary | ICD-10-CM | POA: Diagnosis not present

## 2020-01-22 DIAGNOSIS — Z79899 Other long term (current) drug therapy: Secondary | ICD-10-CM | POA: Diagnosis not present

## 2020-01-22 DIAGNOSIS — Z87891 Personal history of nicotine dependence: Secondary | ICD-10-CM | POA: Diagnosis not present

## 2020-01-22 DIAGNOSIS — D649 Anemia, unspecified: Secondary | ICD-10-CM | POA: Insufficient documentation

## 2020-01-22 DIAGNOSIS — R0602 Shortness of breath: Secondary | ICD-10-CM | POA: Insufficient documentation

## 2020-01-22 DIAGNOSIS — J449 Chronic obstructive pulmonary disease, unspecified: Secondary | ICD-10-CM | POA: Diagnosis not present

## 2020-01-22 DIAGNOSIS — R413 Other amnesia: Secondary | ICD-10-CM | POA: Diagnosis not present

## 2020-01-22 DIAGNOSIS — R42 Dizziness and giddiness: Secondary | ICD-10-CM | POA: Diagnosis not present

## 2020-01-22 DIAGNOSIS — E871 Hypo-osmolality and hyponatremia: Secondary | ICD-10-CM | POA: Diagnosis not present

## 2020-01-22 DIAGNOSIS — Z923 Personal history of irradiation: Secondary | ICD-10-CM | POA: Insufficient documentation

## 2020-01-22 DIAGNOSIS — C3411 Malignant neoplasm of upper lobe, right bronchus or lung: Secondary | ICD-10-CM | POA: Diagnosis not present

## 2020-01-22 DIAGNOSIS — Z7951 Long term (current) use of inhaled steroids: Secondary | ICD-10-CM | POA: Insufficient documentation

## 2020-01-22 DIAGNOSIS — E785 Hyperlipidemia, unspecified: Secondary | ICD-10-CM | POA: Insufficient documentation

## 2020-01-22 DIAGNOSIS — R5383 Other fatigue: Secondary | ICD-10-CM | POA: Insufficient documentation

## 2020-01-22 DIAGNOSIS — I10 Essential (primary) hypertension: Secondary | ICD-10-CM | POA: Insufficient documentation

## 2020-01-22 LAB — CBC WITH DIFFERENTIAL/PLATELET
Abs Immature Granulocytes: 0.01 10*3/uL (ref 0.00–0.07)
Basophils Absolute: 0.1 10*3/uL (ref 0.0–0.1)
Basophils Relative: 1 %
Eosinophils Absolute: 0.2 10*3/uL (ref 0.0–0.5)
Eosinophils Relative: 4 %
HCT: 30.9 % — ABNORMAL LOW (ref 36.0–46.0)
Hemoglobin: 10 g/dL — ABNORMAL LOW (ref 12.0–15.0)
Immature Granulocytes: 0 %
Lymphocytes Relative: 29 %
Lymphs Abs: 1.7 10*3/uL (ref 0.7–4.0)
MCH: 31.6 pg (ref 26.0–34.0)
MCHC: 32.4 g/dL (ref 30.0–36.0)
MCV: 97.8 fL (ref 80.0–100.0)
Monocytes Absolute: 0.6 10*3/uL (ref 0.1–1.0)
Monocytes Relative: 10 %
Neutro Abs: 3.4 10*3/uL (ref 1.7–7.7)
Neutrophils Relative %: 56 %
Platelets: 219 10*3/uL (ref 150–400)
RBC: 3.16 MIL/uL — ABNORMAL LOW (ref 3.87–5.11)
RDW: 12.5 % (ref 11.5–15.5)
WBC: 6 10*3/uL (ref 4.0–10.5)
nRBC: 0 % (ref 0.0–0.2)

## 2020-01-22 LAB — COMPREHENSIVE METABOLIC PANEL
ALT: 24 U/L (ref 0–44)
AST: 26 U/L (ref 15–41)
Albumin: 3.6 g/dL (ref 3.5–5.0)
Alkaline Phosphatase: 73 U/L (ref 38–126)
Anion gap: 11 (ref 5–15)
BUN: 16 mg/dL (ref 8–23)
CO2: 26 mmol/L (ref 22–32)
Calcium: 8.7 mg/dL — ABNORMAL LOW (ref 8.9–10.3)
Chloride: 98 mmol/L (ref 98–111)
Creatinine, Ser: 0.77 mg/dL (ref 0.44–1.00)
GFR calc Af Amer: >60 mL/min (ref >60)
GFR calc non Af Amer: >60 mL/min (ref >60)
Glucose, Bld: 89 mg/dL (ref 70–99)
Potassium: 4.2 mmol/L (ref 3.5–5.1)
Sodium: 135 mmol/L (ref 135–145)
Total Bilirubin: 0.5 mg/dL (ref 0.3–1.2)
Total Protein: 6.6 g/dL (ref 6.5–8.1)

## 2020-01-22 NOTE — Progress Notes (Signed)
[name], [name]  Cancer Staging No matching staging information was found for the patient.   Oncology History Overview Note  # DEC 2013- Squamous cell CA [ Stage IIIA; T4- invading mediastinum; CT Bx] s/p carbo-taxol with RT [stopped chemo x 2 cycles; Feb 26th 2014- poor tol];   # CT feb 2017- Stable Left lung changes; ~12x84mm RUL- June 30th 2017- s/p RT x5 Fx [Dr.Crystal Aug 2017].   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  # COPD Home O2 2.5L -  # 2019-2020-mild anemia- Hb-10; no IDA; colo-"many years ago".   # DIAGNOSIS: Lung cancer- LUL stage III/ RUL stage I  GOALS: cuartive  CURRENT/MOST RECENT THERAPY; surveillaince    Cancer of upper lobe of left lung (HCC)    INTERVAL HISTORY:  Bethany Lambert 86 y.o.  female pleasant patient above history of lung cancer/COPD pancreatic cysts is here for follow-up/review results of MRI abdomen.  Patient denies having any worsening abdominal pain nausea vomiting.  Continues to chronic shortness of breath-on oxygen.  She also complains of chronic dizziness.    Review of Systems  Constitutional: Positive for malaise/fatigue. Negative for chills, diaphoresis, fever and weight loss.  HENT: Negative for nosebleeds and sore throat.   Eyes: Negative for double vision.  Respiratory: Positive for cough and shortness of breath. Negative for hemoptysis and sputum production.   Cardiovascular: Negative for chest pain, palpitations, orthopnea and leg swelling.  Gastrointestinal: Negative for abdominal pain, blood in stool, constipation, diarrhea, heartburn, melena, nausea and vomiting.  Genitourinary: Negative for dysuria, frequency and urgency.  Musculoskeletal: Positive for back pain and joint pain.  Skin: Negative.  Negative for itching and rash.   Neurological: Negative for dizziness, tingling, focal weakness, weakness and headaches.  Endo/Heme/Allergies: Does not bruise/bleed easily.  Psychiatric/Behavioral: Positive for memory loss. Negative for depression. The patient is not nervous/anxious and does not have insomnia.     PAST MEDICAL HISTORY :  Past Medical History:  Diagnosis Date  . 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 COPD (chronic obstructive pulmonary disease) (HCC)    on 2L home o2  . Dizziness   . Erythrocytosis   . GERD (gastroesophageal reflux disease)   . History of chemotherapy   . History of radiation therapy   . Hyperlipidemia   . Hypertension   . Kidney failure   . Polycythemia   . Psoriasis     PAST SURGICAL HISTORY :   Past Surgical History:  Procedure Laterality Date  . CATARACT EXTRACTION    . COLON RESECTION    . CT GUIDED BIOPSY  ([facility] HX)  11/19/2012   lung  . HERNIA REPAIR    . TOTAL ABDOMINAL HYSTERECTOMY      FAMILY HISTORY :   Family History  Problem Relation Age of Onset  . Heart attack Father   . Colon cancer Father   . Stroke Mother   . Cancer Son     SOCIAL HISTORY:   Social History   Tobacco Use  . Smoking status: Former Smoker    Packs/day: 2.00    Years: 40.00    Pack years: 80.00    Types: Cigarettes    Quit date: 06/22/2001    Years since quitting: 18.6  . Smokeless tobacco:  Never Used  Substance Use Topics  . Alcohol use: No    Alcohol/week: 0.0 standard drinks  . Drug use: No    ALLERGIES:  is allergic to ciprofloxacin and norvasc [amlodipine besylate].  MEDICATIONS:  Current Outpatient Medications  Medication Sig Dispense Refill  . ALPRAZolam (XANAX) 0.25 MG tablet Take 0.25 mg by mouth at bedtime as needed for anxiety.    . busPIRone (BUSPAR) 5 MG tablet Take by mouth.    . Cyanocobalamin (VITAMIN B-12 IJ) Inject 1,000 mcg as directed every 30 (thirty) days.     Marland Kitchen diltiazem (CARDIZEM CD) 120 MG 24  hr capsule   2  . docusate sodium (COLACE) 100 MG capsule Take 100-200 mg by mouth 2 (two) times daily. 100 mg in the morning and 200 mg at bedtime    . fluocinonide (LIDEX) 0.05 % external solution Apply 1 application topically 2 (two) times daily.     Marland Kitchen ipratropium-albuterol (DUONEB) 0.5-2.5 (3) MG/3ML SOLN Take 3 mLs by nebulization every 6 (six) hours as needed (wheezing).     Marland Kitchen loratadine (CLARITIN) 10 MG tablet Take 1 tablet (10 mg total) by mouth daily. 30 tablet 0  . losartan (COZAAR) 50 MG tablet Take by mouth.    . Melatonin 5 MG TABS Take 1 tablet by mouth daily.    . metoprolol (LOPRESSOR) 50 MG tablet Take 1 tablet (50 mg total) by mouth 2 (two) times daily.    Marland Kitchen omeprazole (PRILOSEC) 20 MG capsule Take 20 mg by mouth 2 (two) times daily before a meal.     . OXYGEN Inhale 3 L into the lungs continuous.     . potassium chloride (K-DUR) 10 MEQ tablet 10 mEq daily.   11  . Prenatal Vit-Fe Fumarate-FA (PRENATAL MULTIVITAMIN) TABS tablet Take 1 tablet by mouth daily at 12 noon.    Marland Kitchen PROAIR HFA 108 (90 Base) MCG/ACT inhaler INHALE 2 PUFFS INTO THE LUNGS EVERY 6 (SIX) HOURS AS NEEDED FOR WHEEZING. 18 g 2  . SPIRIVA HANDIHALER 18 MCG inhalation capsule Place 1 capsule into inhaler and inhale daily.    . SYMBICORT 80-4.5 MCG/ACT inhaler Inhale 2 puffs into the lungs 2 (two) times daily. 1 Inhaler 4   No current facility-administered medications for this visit.    PHYSICAL EXAMINATION: ECOG PERFORMANCE STATUS: 1 - Symptomatic but completely ambulatory  BP (!) 167/64 (BP Location: Left Arm, Patient Position: Sitting)   Pulse 64   Temp (!) 96.6 F (35.9 C) (Tympanic)   Wt 112 lb 1.6 oz (50.8 kg)   BMI 23.43 kg/m   Filed Weights   01/22/20 1322  Weight: 112 lb 1.6 oz (50.8 kg)    Physical Exam  Constitutional: She is oriented to person, place, and time.  Thin built Caucasian female patient.  Accompanied by daughter.  She is in a wheelchair.  2.5 L of oxygen.  HENT:  Head:  Normocephalic and atraumatic.  Mouth/Throat: Oropharynx is clear and moist. No oropharyngeal exudate.  Eyes: Pupils are equal, round, and reactive to light.  Cardiovascular: Normal rate and regular rhythm.  Pulmonary/Chest: No respiratory distress. She has no wheezes.  Decreased air entry bilaterally.  Abdominal: Soft. Bowel sounds are normal. She exhibits no distension and no mass. There is no abdominal tenderness. There is no rebound and no guarding.  Musculoskeletal:        General: No tenderness or edema. Normal range of motion.     Cervical back: Normal range of motion and neck supple.  Neurological: She is alert and oriented to person, place, and time.  Skin: Skin is warm.  Psychiatric: Affect normal.       LABORATORY DATA:  I have reviewed the data as listed    Component Value Date/Time   NA 135 01/22/2020 1311   NA 136 12/29/2014 0827   K 4.2 01/22/2020 1311   K 4.0 12/29/2014 0827   CL 98 01/22/2020 1311   CL 102 12/29/2014 0827   CO2 26 01/22/2020 1311   CO2 28 12/29/2014 0827   GLUCOSE 89 01/22/2020 1311   GLUCOSE 115 (H) 12/29/2014 0827   BUN 16 01/22/2020 1311   BUN 15 12/29/2014 0827   CREATININE 0.77 01/22/2020 1311   CREATININE 1.23 12/29/2014 0827   CALCIUM 8.7 (L) 01/22/2020 1311   CALCIUM 8.8 12/29/2014 0827   PROT 6.6 01/22/2020 1311   PROT 6.3 (L) 12/01/2014 1355   ALBUMIN 3.6 01/22/2020 1311   ALBUMIN 3.2 (L) 12/01/2014 1355   AST 26 01/22/2020 1311   AST 14 (L) 12/01/2014 1355   ALT 24 01/22/2020 1311   ALT 22 12/01/2014 1355   ALKPHOS 73 01/22/2020 1311   ALKPHOS 100 12/01/2014 1355   BILITOT 0.5 01/22/2020 1311   BILITOT 0.3 12/01/2014 1355   GFRNONAA >60 01/22/2020 1311   GFRNONAA 45 (L) 12/29/2014 0827   GFRNONAA 42 (L) 08/13/2014 1020   GFRAA >60 01/22/2020 1311   GFRAA 54 (L) 12/29/2014 0827   GFRAA 49 (L) 08/13/2014 1020    No results found for: SPEP, UPEP  Lab Results  Component Value Date   WBC 6.0 01/22/2020   NEUTROABS  3.4 01/22/2020   HGB 10.0 (L) 01/22/2020   HCT 30.9 (L) 01/22/2020   MCV 97.8 01/22/2020   PLT 219 01/22/2020      Chemistry      Component Value Date/Time   NA 135 01/22/2020 1311   NA 136 12/29/2014 0827   K 4.2 01/22/2020 1311   K 4.0 12/29/2014 0827   CL 98 01/22/2020 1311   CL 102 12/29/2014 0827   CO2 26 01/22/2020 1311   CO2 28 12/29/2014 0827   BUN 16 01/22/2020 1311   BUN 15 12/29/2014 0827   CREATININE 0.77 01/22/2020 1311   CREATININE 1.23 12/29/2014 0827      Component Value Date/Time   CALCIUM 8.7 (L) 01/22/2020 1311   CALCIUM 8.8 12/29/2014 0827   ALKPHOS 73 01/22/2020 1311   ALKPHOS 100 12/01/2014 1355   AST 26 01/22/2020 1311   AST 14 (L) 12/01/2014 1355   ALT 24 01/22/2020 1311   ALT 22 12/01/2014 1355   BILITOT 0.5 01/22/2020 1311   BILITOT 0.3 12/01/2014 1355       RADIOGRAPHIC STUDIES: I have personally reviewed the radiological images as listed and agreed with the findings in the report. No results found.   ASSESSMENT & PLAN:  Cancer of upper lobe of left lung ([place]) # Right upper lobe lung nodule [11 x 8 mm] s/p SBRT in end of July 2017.  CT scan-August 2020-negative for any recurrence.  Stable.  Plan repeat CT imaging in 3 months.  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  AUG 2020-CT scan stable.  Patient clinically stable.  Imaging in 3 months.  # Pancreatic cyst-February 2021 MRI abdomen shows multiple cysts in the pancreas-pseudocyst-not concerning for malignancy.  Clinically stable.  Repeat in spring 2023.   #Chronic anemia-normocytic; hemoglobin 10.  Stable monitor for  now/this patient the context of multiple medical problems.  # Hyponatremia- sodium 134- improved  # Dizziness- chronic [[facility] neurology]-recommend involving neurology.  #COPD chronic.  Stable/continue follow-up with Dr.Khan.  # DISPOSITION:  # follow up in 3 months- MD- labs-cbc/cmp; CT  chest prior -Dr.B  # I reviewed the blood work- with the patient in detail; also reviewed the imaging independently [as summarized above]; and with the patient in detail.     Orders Placed This Encounter  Procedures  . CT Chest W Contrast    Standing Status:   Future    Standing Expiration Date:   01/21/2021    Order Specific Question:   ** REASON FOR EXAM (FREE TEXT)    Answer:   lung cancer; lung nodules    Order Specific Question:   If indicated for the ordered procedure, I authorize the administration of contrast media per Radiology protocol    Answer:   Yes    Order Specific Question:   Preferred imaging location?    Answer:   [facility]    Order Specific Question:   Radiology Contrast Protocol - do NOT remove file path    Answer:   [email or web address]  . CBC with Differential    Standing Status:   Future    Standing Expiration Date:   01/21/2021  . Comprehensive metabolic panel    Standing Status:   Future    Standing Expiration Date:   01/21/2021   All questions were answered. The patient knows to call the clinic with any problems, questions or concerns.      Cammie Sickle, MD 01/23/2020 9:40 AM

## 2020-01-22 NOTE — Assessment & Plan Note (Addendum)
#   Right upper lobe lung nodule [11 x 8 mm] s/p SBRT in end of July 2017.  CT scan-August 2020-negative for any recurrence.  Stable.  Plan repeat CT imaging in 3 months.  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  AUG 2020-CT scan stable.  Patient clinically stable.  Imaging in 3 months.  # Pancreatic cyst-February 2021 MRI abdomen shows multiple cysts in the pancreas-pseudocyst-not concerning for malignancy.  Clinically stable.  Repeat in spring 2023.   #Chronic anemia-normocytic; hemoglobin 10.  Stable monitor for now/this patient the context of multiple medical problems.  # Hyponatremia- sodium 134- improved  # Dizziness- chronic [[facility] neurology]-recommend involving neurology.  #COPD chronic.  Stable/continue follow-up with Dr.Khan.  # DISPOSITION:  # follow up in 3 months- MD- labs-cbc/cmp; CT chest prior -Dr.B  # I reviewed the blood work- with the patient in detail; also reviewed the imaging independently [as summarized above]; and with the patient in detail.

## 2020-01-30 DIAGNOSIS — J439 Emphysema, unspecified: Secondary | ICD-10-CM | POA: Diagnosis not present

## 2020-01-30 DIAGNOSIS — I4891 Unspecified atrial fibrillation: Secondary | ICD-10-CM | POA: Diagnosis not present

## 2020-01-30 DIAGNOSIS — Z1389 Encounter for screening for other disorder: Secondary | ICD-10-CM | POA: Diagnosis not present

## 2020-01-30 DIAGNOSIS — Z0000 Encounter for general adult medical examination without abnormal findings: Secondary | ICD-10-CM | POA: Diagnosis not present

## 2020-01-30 DIAGNOSIS — I10 Essential (primary) hypertension: Secondary | ICD-10-CM | POA: Diagnosis not present

## 2020-02-07 DIAGNOSIS — J449 Chronic obstructive pulmonary disease, unspecified: Secondary | ICD-10-CM | POA: Diagnosis not present

## 2020-02-13 ENCOUNTER — Telehealth: Payer: Self-pay

## 2020-02-13 NOTE — Telephone Encounter (Signed)
Confirmed appointment on 02/17/2020. klh

## 2020-02-17 ENCOUNTER — Ambulatory Visit: Payer: Medicare Other | Admitting: Internal Medicine

## 2020-02-17 ENCOUNTER — Encounter: Payer: Self-pay | Admitting: Internal Medicine

## 2020-02-17 ENCOUNTER — Other Ambulatory Visit: Payer: Self-pay

## 2020-02-17 VITALS — BP 168/66 | HR 63 | Temp 97.2°F | Resp 16 | Ht <= 58 in | Wt 114.0 lb

## 2020-02-17 DIAGNOSIS — J449 Chronic obstructive pulmonary disease, unspecified: Secondary | ICD-10-CM | POA: Diagnosis not present

## 2020-02-17 DIAGNOSIS — I10 Essential (primary) hypertension: Secondary | ICD-10-CM

## 2020-02-17 DIAGNOSIS — J9610 Chronic respiratory failure, unspecified whether with hypoxia or hypercapnia: Secondary | ICD-10-CM | POA: Diagnosis not present

## 2020-02-17 DIAGNOSIS — R0602 Shortness of breath: Secondary | ICD-10-CM

## 2020-02-17 DIAGNOSIS — K219 Gastro-esophageal reflux disease without esophagitis: Secondary | ICD-10-CM

## 2020-02-17 DIAGNOSIS — Z9981 Dependence on supplemental oxygen: Secondary | ICD-10-CM

## 2020-02-17 NOTE — Progress Notes (Signed)
[facility], [place] [address]  Pulmonary Sleep Medicine   Office Visit Note  Patient Name: Bethany Lambert DOB: [birth year] MRN [id number]  Date of Service: 02/17/2020  Complaints/HPI: PT is here for pulmonary follow up. She appears to be at her baseline.  She continues to use oxygen continuously at 2-3lpm.  She has not needed her nebulizer in a few months.  She is using her inhalers as directed.  Symbicort and Spiriva are being used daily.  She has not needed her rescue inhaler, proair, in some time.   ROS  General: (-) fever, (-) chills, (-) night sweats, (-) weakness Skin: (-) rashes, (-) itching,. Eyes: (-) visual changes, (-) redness, (-) itching. Nose and Sinuses: (-) nasal stuffiness or itchiness, (-) postnasal drip, (-) nosebleeds, (-) sinus trouble. Mouth and Throat: (-) sore throat, (-) hoarseness. Neck: (-) swollen glands, (-) enlarged thyroid, (-) neck pain. Respiratory: - cough, (-) bloody sputum, + shortness of breath, - wheezing. Cardiovascular: - ankle swelling, (-) chest pain. Lymphatic: (-) lymph node enlargement. Neurologic: (-) numbness, (-) tingling. Psychiatric: (-) anxiety, (-) depression   Current Medication: Outpatient Encounter Medications as of 02/17/2020  Medication Sig  . ALPRAZolam (XANAX) 0.25 MG tablet Take 0.25 mg by mouth at bedtime as needed for anxiety.  . busPIRone (BUSPAR) 5 MG tablet Take by mouth.  . Cyanocobalamin (VITAMIN B-12 IJ) Inject 1,000 mcg as directed every 30 (thirty) days.   Marland Kitchen diltiazem (CARDIZEM CD) 120 MG 24 hr capsule   . docusate sodium (COLACE) 100 MG capsule Take 100-200 mg by mouth 2 (two) times daily. 100 mg in the morning and 200 mg at bedtime  . fluocinonide (LIDEX) 0.05 % external solution Apply 1 application topically 2 (two) times daily.   Marland Kitchen ipratropium-albuterol (DUONEB) 0.5-2.5 (3) MG/3ML SOLN Take 3 mLs by nebulization every 6 (six) hours as needed (wheezing).   Marland Kitchen loratadine  (CLARITIN) 10 MG tablet Take 1 tablet (10 mg total) by mouth daily.  Marland Kitchen losartan (COZAAR) 50 MG tablet Take by mouth.  . Melatonin 5 MG TABS Take 1 tablet by mouth daily.  . metoprolol (LOPRESSOR) 50 MG tablet Take 1 tablet (50 mg total) by mouth 2 (two) times daily.  Marland Kitchen omeprazole (PRILOSEC) 20 MG capsule Take 20 mg by mouth 2 (two) times daily before a meal.   . OXYGEN Inhale 3 L into the lungs continuous.   . potassium chloride (K-DUR) 10 MEQ tablet 10 mEq daily.   . Prenatal Vit-Fe Fumarate-FA (PRENATAL MULTIVITAMIN) TABS tablet Take 1 tablet by mouth daily at 12 noon.  Marland Kitchen PROAIR HFA 108 (90 Base) MCG/ACT inhaler INHALE 2 PUFFS INTO THE LUNGS EVERY 6 (SIX) HOURS AS NEEDED FOR WHEEZING.  . SPIRIVA HANDIHALER 18 MCG inhalation capsule Place 1 capsule into inhaler and inhale daily.  . SYMBICORT 80-4.5 MCG/ACT inhaler Inhale 2 puffs into the lungs 2 (two) times daily.   No facility-administered encounter medications on file as of 02/17/2020.    Surgical History: Past Surgical History:  Procedure Laterality Date  . CATARACT EXTRACTION    . COLON RESECTION    . CT GUIDED BIOPSY  ([facility] HX)  11/19/2012   lung  . HERNIA REPAIR    . TOTAL ABDOMINAL HYSTERECTOMY      Medical History: Past Medical History:  Diagnosis Date  . 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 COPD (chronic obstructive pulmonary disease) (  [place])    on 2L home o2  . Dizziness   . Erythrocytosis   . GERD (gastroesophageal reflux disease)   . History of chemotherapy   . History of radiation therapy   . Hyperlipidemia   . Hypertension   . Kidney failure   . Polycythemia   . Psoriasis     Family History: Family History  Problem Relation Age of Onset  . Heart attack Father   . Colon cancer Father   . Stroke Mother   . Cancer Son     Social History: Social History   Socioeconomic History  . Marital status: Widowed    Spouse name: Not on file  . Number of  children: Not on file  . Years of education: Not on file  . Highest education level: Not on file  Occupational History  . Not on file  Tobacco Use  . Smoking status: Former Smoker    Packs/day: 2.00    Years: 40.00    Pack years: 80.00    Types: Cigarettes    Quit date: 06/22/2001    Years since quitting: 18.6  . Smokeless tobacco: Never Used  Substance and Sexual Activity  . Alcohol use: No    Alcohol/week: 0.0 standard drinks  . Drug use: No  . Sexual activity: Not on file  Other Topics Concern  . Not on file  Social History Narrative   Lives at home with her son.  Has a walker but mostly gets around without it.   Social Determinants of Health   Financial Resource Strain:   . Difficulty of Paying Living Expenses:   Food Insecurity:   . Worried About Charity fundraiser in the Last Year:   . Arboriculturist in the Last Year:   Transportation Needs:   . Film/video editor (Medical):   Marland Kitchen Lack of Transportation (Non-Medical):   Physical Activity:   . Days of Exercise per Week:   . Minutes of Exercise per Session:   Stress:   . Feeling of Stress :   Social Connections:   . Frequency of Communication with Friends and Family:   . Frequency of Social Gatherings with Friends and Family:   . Attends Religious Services:   . Active Member of Clubs or Organizations:   . Attends Archivist Meetings:   Marland Kitchen Marital Status:   Intimate Partner Violence:   . Fear of Current or Ex-Partner:   . Emotionally Abused:   Marland Kitchen Physically Abused:   . Sexually Abused:     Vital Signs: Blood pressure (!) 181/56, pulse 63, temperature (!) 97.2 F (36.2 C), resp. rate 16, height 4\' 10"  (1.473 m), weight 114 lb (51.7 kg), SpO2 100 %.  Examination: General Appearance: The patient is well-developed, well-nourished, and in no distress. Skin: Gross inspection of skin unremarkable. Head: normocephalic, no gross deformities. Eyes: no gross deformities noted. ENT: ears appear grossly  normal no exudates. Neck: Supple. No thyromegaly. No LAD. Respiratory: clear bilaterally. Cardiovascular: Normal S1 and S2 without murmur or rub. Extremities: No cyanosis. pulses are equal. Neurologic: Alert and oriented. No involuntary movements.  LABS: Recent Results (from the past 2160 hour(s))  I-STAT creatinine     Status: None   Collection Time: 01/20/20 10:50 AM  Result Value Ref Range   Creatinine, Ser 0.90 0.44 - 1.00 mg/dL  CBC with Differential     Status: Abnormal   Collection Time: 01/22/20  1:11 PM  Result Value Ref Range  WBC 6.0 4.0 - 10.5 K/uL   RBC 3.16 (L) 3.87 - 5.11 MIL/uL   Hemoglobin 10.0 (L) 12.0 - 15.0 g/dL   HCT 30.9 (L) 36.0 - 46.0 %   MCV 97.8 80.0 - 100.0 fL   MCH 31.6 26.0 - 34.0 pg   MCHC 32.4 30.0 - 36.0 g/dL   RDW 12.5 11.5 - 15.5 %   Platelets 219 150 - 400 K/uL   nRBC 0.0 0.0 - 0.2 %   Neutrophils Relative % 56 %   Neutro Abs 3.4 1.7 - 7.7 K/uL   Lymphocytes Relative 29 %   Lymphs Abs 1.7 0.7 - 4.0 K/uL   Monocytes Relative 10 %   Monocytes Absolute 0.6 0.1 - 1.0 K/uL   Eosinophils Relative 4 %   Eosinophils Absolute 0.2 0.0 - 0.5 K/uL   Basophils Relative 1 %   Basophils Absolute 0.1 0.0 - 0.1 K/uL   Immature Granulocytes 0 %   Abs Immature Granulocytes 0.01 0.00 - 0.07 K/uL    Comment: Performed at [facility], [address] [place], [place] [id number]  Comprehensive metabolic panel     Status: Abnormal   Collection Time: 01/22/20  1:11 PM  Result Value Ref Range   Sodium 135 135 - 145 mmol/L   Potassium 4.2 3.5 - 5.1 mmol/L   Chloride 98 98 - 111 mmol/L   CO2 26 22 - 32 mmol/L   Glucose, Bld 89 70 - 99 mg/dL   BUN 16 8 - 23 mg/dL   Creatinine, Ser 0.77 0.44 - 1.00 mg/dL   Calcium 8.7 (L) 8.9 - 10.3 mg/dL   Total Protein 6.6 6.5 - 8.1 g/dL   Albumin 3.6 3.5 - 5.0 g/dL   AST 26 15 - 41 U/L   ALT 24 0 - 44 U/L   Alkaline Phosphatase 73 38 - 126 U/L   Total Bilirubin 0.5 0.3 - 1.2 mg/dL   GFR calc non Af Amer >60 >60  mL/min   GFR calc Af Amer >60 >60 mL/min   Anion gap 11 5 - 15    Comment: Performed at [facility], [address] [place], [place] [address]    Radiology: MR Abdomen W Wo Contrast  Result Date: 01/20/2020 CLINICAL DATA:  Pancreatic cystic lesion follow up EXAM: MRI ABDOMEN WITHOUT AND WITH CONTRAST TECHNIQUE: Multiplanar multisequence MR imaging of the abdomen was performed both before and after the administration of intravenous contrast. CONTRAST:  29mL GADAVIST GADOBUTROL 1 MMOL/ML IV SOLN COMPARISON:  Multiple exams, including 07/22/2019 and 01/10/2018 FINDINGS: Despite efforts by the technologist and patient, motion artifact is present on today's exam and could not be eliminated. This reduces exam sensitivity and specificity. Lower chest: Part solid right middle lobe nodule is again identified. Morphologic comparison between MRI and CT is difficult to make but grossly the lesion appears similar to the CT chest from 07/22/2019. Hepatobiliary: Unremarkable Pancreas: About 6 small pancreatic cystic lesions are again identified. Dominant lesion is in the tail the pancreas and measures 1.7 by 1.5 by 1.6 cm, image 17/21. By my measurements this was previously 1.6 by 1.4 by 1.4 cm on 01/10/2018, which does not formally qualify as interval growth. None of these lesions demonstrate worrisome enhancement. Spleen:  Unremarkable Adrenals/Urinary Tract:  Unremarkable Stomach/Bowel: Scattered diverticula in the sigmoid colon. Vascular/Lymphatic: Aortoiliac atherosclerotic vascular disease. No pathologic adenopathy. Other:  No supplemental non-categorized findings. Musculoskeletal: Unremarkable IMPRESSION: 1. About 6 small cystic lesions of the pancreas are observed. The largest  measures 1.7 by 1.5 by 1.6 cm, by my measurements not qualifying as interval growth compared to the 01/10/2018 exam. No worrisome features. Follow up pancreatic protocol MRI is recommended in 2 years time. This recommendation follows  ACR consensus guidelines: Management of Incidental Pancreatic Cysts: A White Paper of the ACR Incidental Findings Committee. [place] 6644;03:474-259. 2. Part of the solid right middle lobe nodule is again identified. Morphologic characteristics of this lesion are stable compared to 07/22/2019. 3. Scattered diverticula in the sigmoid colon. 4. Despite efforts by the technologist and patient, motion artifact is present on today's exam and could not be eliminated. This reduces exam sensitivity and specificity. 5.  Aortic Atherosclerosis (ICD10-I70.0). Electronically Signed   By: Van Clines M.D.   On: 01/20/2020 13:07    No results found.  MR Abdomen W Wo Contrast  Result Date: 01/20/2020 CLINICAL DATA:  Pancreatic cystic lesion follow up EXAM: MRI ABDOMEN WITHOUT AND WITH CONTRAST TECHNIQUE: Multiplanar multisequence MR imaging of the abdomen was performed both before and after the administration of intravenous contrast. CONTRAST:  18mL GADAVIST GADOBUTROL 1 MMOL/ML IV SOLN COMPARISON:  Multiple exams, including 07/22/2019 and 01/10/2018 FINDINGS: Despite efforts by the technologist and patient, motion artifact is present on today's exam and could not be eliminated. This reduces exam sensitivity and specificity. Lower chest: Part solid right middle lobe nodule is again identified. Morphologic comparison between MRI and CT is difficult to make but grossly the lesion appears similar to the CT chest from 07/22/2019. Hepatobiliary: Unremarkable Pancreas: About 6 small pancreatic cystic lesions are again identified. Dominant lesion is in the tail the pancreas and measures 1.7 by 1.5 by 1.6 cm, image 17/21. By my measurements this was previously 1.6 by 1.4 by 1.4 cm on 01/10/2018, which does not formally qualify as interval growth. None of these lesions demonstrate worrisome enhancement. Spleen:  Unremarkable Adrenals/Urinary Tract:  Unremarkable Stomach/Bowel: Scattered diverticula in the sigmoid  colon. Vascular/Lymphatic: Aortoiliac atherosclerotic vascular disease. No pathologic adenopathy. Other:  No supplemental non-categorized findings. Musculoskeletal: Unremarkable IMPRESSION: 1. About 6 small cystic lesions of the pancreas are observed. The largest measures 1.7 by 1.5 by 1.6 cm, by my measurements not qualifying as interval growth compared to the 01/10/2018 exam. No worrisome features. Follow up pancreatic protocol MRI is recommended in 2 years time. This recommendation follows ACR consensus guidelines: Management of Incidental Pancreatic Cysts: A White Paper of the ACR Incidental Findings Committee. [place] 5638;75:[id number]. 2. Part of the solid right middle lobe nodule is again identified. Morphologic characteristics of this lesion are stable compared to 07/22/2019. 3. Scattered diverticula in the sigmoid colon. 4. Despite efforts by the technologist and patient, motion artifact is present on today's exam and could not be eliminated. This reduces exam sensitivity and specificity. 5.  Aortic Atherosclerosis (ICD10-I70.0). Electronically Signed   By: Van Clines M.D.   On: 01/20/2020 13:07      Assessment and Plan: Patient Active Problem List   Diagnosis Date Noted  . Normocytic anemia 07/24/2019  . Hyponatremia 05/31/2018  . Pneumonia 05/18/2018  . Pancreatic cyst 07/10/2017  . COPD exacerbation ([place]) 09/09/2016  . GERD (gastroesophageal reflux disease) 09/09/2016  . HTN (hypertension) 09/09/2016  . Cancer of upper lobe of left lung ([place]) 07/06/2016  . Tachycardia 02/03/2013  . Dyspnea 02/03/2013   1. COPD, severe ([place]) On continuous oxygen, severe disease.  Continue current medications.   2. Chronic respiratory failure, unspecified whether with hypoxia or hypercapnia ([place]) Continue to use  oxygen as prescribed.   3. Oxygen dependent Continue to use oxygen 2-3 LPM continuous.   4. Hypertension, unspecified type BP slightly elevated initially, on recheck was  168/66. Continue to monitor.   5. Gastroesophageal reflux disease without esophagitis Continue omeprazole as directed.   6. SOB (shortness of breath) Unable to to spiro today, will try again next visit.    General Counseling: I have discussed the findings of the evaluation and examination with Emelynn.  I have also discussed any further diagnostic evaluation thatmay be needed or ordered today. Aoife verbalizes understanding of the findings of todays visit. We also reviewed her medications today and discussed drug interactions and side effects including but not limited excessive drowsiness and altered mental states. We also discussed that there is always a risk not just to her but also people around her. she has been encouraged to call the office with any questions or concerns that should arise related to todays visit.  No orders of the defined types were placed in this encounter.    Time spent: 30 includes 10 minutes of chart review This patient was seen by Orson Gear AGNP-C in Collaboration with Dr. Devona Konig as a part of collaborative care agreement.   I have personally obtained a history, examined the patient, evaluated laboratory and imaging results, formulated the assessment and plan and placed orders.    Allyne Gee, MD [facility] Pulmonary and Critical Care Sleep medicine

## 2020-03-03 ENCOUNTER — Other Ambulatory Visit: Payer: Self-pay | Admitting: Adult Health

## 2020-03-09 DIAGNOSIS — J449 Chronic obstructive pulmonary disease, unspecified: Secondary | ICD-10-CM | POA: Diagnosis not present

## 2020-03-28 ENCOUNTER — Other Ambulatory Visit: Payer: Self-pay | Admitting: Internal Medicine

## 2020-03-28 DIAGNOSIS — J449 Chronic obstructive pulmonary disease, unspecified: Secondary | ICD-10-CM

## 2020-04-13 ENCOUNTER — Telehealth: Payer: Self-pay

## 2020-04-13 NOTE — Telephone Encounter (Signed)
Confirmed appointment on 04/15/2020 and screened for covid. [facility]

## 2020-04-15 ENCOUNTER — Ambulatory Visit: Payer: Medicare Other | Admitting: Internal Medicine

## 2020-04-15 ENCOUNTER — Other Ambulatory Visit: Payer: Self-pay

## 2020-04-15 DIAGNOSIS — J449 Chronic obstructive pulmonary disease, unspecified: Secondary | ICD-10-CM | POA: Diagnosis not present

## 2020-04-15 LAB — PULMONARY FUNCTION TEST

## 2020-04-20 ENCOUNTER — Ambulatory Visit
Admission: RE | Admit: 2020-04-20 | Discharge: 2020-04-20 | Disposition: A | Discharge status: Home / Self Care | Payer: Medicare Other | Source: Ambulatory Visit | Attending: Internal Medicine | Admitting: Internal Medicine

## 2020-04-20 ENCOUNTER — Other Ambulatory Visit: Payer: Self-pay

## 2020-04-20 DIAGNOSIS — C3412 Malignant neoplasm of upper lobe, left bronchus or lung: Secondary | ICD-10-CM | POA: Insufficient documentation

## 2020-04-20 LAB — POCT I-STAT CREATININE: Creatinine, Ser: 0.9 mg/dL (ref 0.44–1.00)

## 2020-04-20 MED ORDER — IOHEXOL 300 MG/ML  SOLN
60.0000 mL | Freq: Once | INTRAMUSCULAR | Status: AC | PRN
  Administered 2020-04-20: 60 mL via INTRAVENOUS

## 2020-04-22 ENCOUNTER — Inpatient Hospital Stay: Payer: Medicare Other | Attending: Internal Medicine

## 2020-04-22 ENCOUNTER — Inpatient Hospital Stay: Payer: Medicare Other | Admitting: Internal Medicine

## 2020-04-22 ENCOUNTER — Other Ambulatory Visit: Payer: Self-pay

## 2020-04-22 ENCOUNTER — Encounter: Payer: Self-pay | Admitting: Internal Medicine

## 2020-04-22 DIAGNOSIS — R42 Dizziness and giddiness: Secondary | ICD-10-CM | POA: Insufficient documentation

## 2020-04-22 DIAGNOSIS — C3412 Malignant neoplasm of upper lobe, left bronchus or lung: Secondary | ICD-10-CM | POA: Diagnosis not present

## 2020-04-22 DIAGNOSIS — K863 Pseudocyst of pancreas: Secondary | ICD-10-CM | POA: Insufficient documentation

## 2020-04-22 DIAGNOSIS — Z809 Family history of malignant neoplasm, unspecified: Secondary | ICD-10-CM | POA: Diagnosis not present

## 2020-04-22 DIAGNOSIS — Z87891 Personal history of nicotine dependence: Secondary | ICD-10-CM | POA: Diagnosis not present

## 2020-04-22 DIAGNOSIS — Z923 Personal history of irradiation: Secondary | ICD-10-CM | POA: Diagnosis not present

## 2020-04-22 DIAGNOSIS — D649 Anemia, unspecified: Secondary | ICD-10-CM | POA: Insufficient documentation

## 2020-04-22 DIAGNOSIS — Z9221 Personal history of antineoplastic chemotherapy: Secondary | ICD-10-CM | POA: Diagnosis not present

## 2020-04-22 DIAGNOSIS — Z9981 Dependence on supplemental oxygen: Secondary | ICD-10-CM | POA: Insufficient documentation

## 2020-04-22 DIAGNOSIS — Z800 Family history of malignant neoplasm of digestive organs: Secondary | ICD-10-CM | POA: Insufficient documentation

## 2020-04-22 DIAGNOSIS — J449 Chronic obstructive pulmonary disease, unspecified: Secondary | ICD-10-CM | POA: Insufficient documentation

## 2020-04-22 LAB — CBC WITH DIFFERENTIAL/PLATELET
Abs Immature Granulocytes: 0.02 10*3/uL (ref 0.00–0.07)
Basophils Absolute: 0.1 10*3/uL (ref 0.0–0.1)
Basophils Relative: 1 %
Eosinophils Absolute: 0.2 10*3/uL (ref 0.0–0.5)
Eosinophils Relative: 3 %
HCT: 28.7 % — ABNORMAL LOW (ref 36.0–46.0)
Hemoglobin: 10 g/dL — ABNORMAL LOW (ref 12.0–15.0)
Immature Granulocytes: 0 %
Lymphocytes Relative: 25 %
Lymphs Abs: 1.5 10*3/uL (ref 0.7–4.0)
MCH: 31.7 pg (ref 26.0–34.0)
MCHC: 34.8 g/dL (ref 30.0–36.0)
MCV: 91.1 fL (ref 80.0–100.0)
Monocytes Absolute: 0.6 10*3/uL (ref 0.1–1.0)
Monocytes Relative: 10 %
Neutro Abs: 3.7 10*3/uL (ref 1.7–7.7)
Neutrophils Relative %: 61 %
Platelets: 184 10*3/uL (ref 150–400)
RBC: 3.15 MIL/uL — ABNORMAL LOW (ref 3.87–5.11)
RDW: 13.2 % (ref 11.5–15.5)
WBC: 6 10*3/uL (ref 4.0–10.5)
nRBC: 0 % (ref 0.0–0.2)

## 2020-04-22 LAB — COMPREHENSIVE METABOLIC PANEL
ALT: 23 U/L (ref 0–44)
AST: 24 U/L (ref 15–41)
Albumin: 3.4 g/dL — ABNORMAL LOW (ref 3.5–5.0)
Alkaline Phosphatase: 77 U/L (ref 38–126)
Anion gap: 9 (ref 5–15)
BUN: 15 mg/dL (ref 8–23)
CO2: 29 mmol/L (ref 22–32)
Calcium: 8.4 mg/dL — ABNORMAL LOW (ref 8.9–10.3)
Chloride: 99 mmol/L (ref 98–111)
Creatinine, Ser: 0.83 mg/dL (ref 0.44–1.00)
GFR calc Af Amer: >60 mL/min (ref >60)
GFR calc non Af Amer: >60 mL/min (ref >60)
Glucose, Bld: 92 mg/dL (ref 70–99)
Potassium: 3.9 mmol/L (ref 3.5–5.1)
Sodium: 137 mmol/L (ref 135–145)
Total Bilirubin: 0.5 mg/dL (ref 0.3–1.2)
Total Protein: 6.2 g/dL — ABNORMAL LOW (ref 6.5–8.1)

## 2020-04-22 NOTE — Progress Notes (Signed)
[place] OFFICE PROGRESS NOTE  Patient Care Team: Baxter Hire, [name]  Cancer Staging No matching staging information was found for the patient.   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  # COPD Home O2 2.5L -  # 2019-2020-mild anemia- Hb-10; no IDA; colo-"many years ago".   # DIAGNOSIS: Lung cancer- LUL stage III/ RUL stage I  GOALS: cuartive  CURRENT/MOST RECENT THERAPY; surveillaince    Cancer of upper lobe of left lung (HCC)    INTERVAL HISTORY:  Bethany Lambert 86 y.o.  female pleasant patient above history of lung cancer/COPD pancreatic cysts is here for follow-up/review results of CT chest.  Patient continues to chronic shortness of breath chronic fatigue.  Denies any abdominal pain.  Denies any nausea vomiting.  Continues to be on oxygen.  She continues have chronic dizziness.  Not any worse   Review of Systems  Constitutional: Positive for malaise/fatigue. Negative for chills, diaphoresis, fever and weight loss.  HENT: Negative for nosebleeds and sore throat.   Eyes: Negative for double vision.  Respiratory: Positive for cough and shortness of breath. Negative for hemoptysis and sputum production.   Cardiovascular: Negative for chest pain, palpitations, orthopnea and leg swelling.  Gastrointestinal: Negative for abdominal pain, blood in stool, constipation, diarrhea, heartburn, melena, nausea and vomiting.  Genitourinary: Negative for dysuria, frequency and urgency.  Musculoskeletal: Positive for back pain and joint pain.  Skin:  Negative.  Negative for itching and rash.  Neurological: Negative for dizziness, tingling, focal weakness, weakness and headaches.  Endo/Heme/Allergies: Does not bruise/bleed easily.  Psychiatric/Behavioral: Positive for memory loss. Negative for depression. The patient is not nervous/anxious and does not have insomnia.     PAST MEDICAL HISTORY :  Past Medical History:  Diagnosis Date  . 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 COPD (chronic obstructive pulmonary disease) (HCC)    on 2L home o2  . Dizziness   . Erythrocytosis   . GERD (gastroesophageal reflux disease)   . History of chemotherapy   . History of radiation therapy   . Hyperlipidemia   . Hypertension   . Kidney failure   . Polycythemia   . Psoriasis     PAST SURGICAL HISTORY :   Past Surgical History:  Procedure Laterality Date  . CATARACT EXTRACTION    . COLON RESECTION    . CT GUIDED BIOPSY  ([facility] HX)  11/19/2012   lung  . HERNIA REPAIR    . TOTAL ABDOMINAL HYSTERECTOMY      FAMILY HISTORY :   Family History  Problem Relation Age of Onset  . Heart attack Father   . Colon cancer Father   . Stroke Mother   . Cancer Son     SOCIAL HISTORY:   Social History   Tobacco Use  . Smoking status: Former Smoker    Packs/day: 2.00    Years: 40.00    Pack years: 80.00    Types: Cigarettes    Quit date: 06/22/2001  Years since quitting: 18.8  . Smokeless tobacco: Never Used  Substance Use Topics  . Alcohol use: No    Alcohol/week: 0.0 standard drinks  . Drug use: No    ALLERGIES:  is allergic to ciprofloxacin and norvasc [amlodipine besylate].  MEDICATIONS:  Current Outpatient Medications  Medication Sig Dispense Refill  . ALPRAZolam (XANAX) 0.25 MG tablet TAKE 1 TABLET BY MOUTH 2 TIMES DAILY AS NEEDED FOR ANXIETY FOR UP TO 30 DAYS    . busPIRone (BUSPAR) 5 MG tablet Take by mouth.    . diltiazem (CARDIZEM CD) 120 MG 24 hr capsule   2  . diltiazem  (TIAZAC) 120 MG 24 hr capsule Take by mouth.    . docusate sodium (COLACE) 100 MG capsule Take 100-200 mg by mouth 2 (two) times daily. 100 mg in the morning and 200 mg at bedtime    . losartan (COZAAR) 50 MG tablet Take by mouth.    . Melatonin 5 MG TABS Take 1 tablet by mouth daily.    . metoprolol (LOPRESSOR) 50 MG tablet Take 1 tablet (50 mg total) by mouth 2 (two) times daily.    . metoprolol tartrate (LOPRESSOR) 50 MG tablet Take by mouth.    Marland Kitchen omeprazole (PRILOSEC) 20 MG capsule Take 20 mg by mouth 2 (two) times daily before a meal.     . omeprazole (PRILOSEC) 20 MG capsule Take by mouth.    . OXYGEN Inhale 3 L into the lungs continuous.     . potassium chloride (K-DUR) 10 MEQ tablet 10 mEq daily.   11  . potassium chloride (KLOR-CON) 10 MEQ tablet TAKE 1 TABLET BY MOUTH DAILY    . Prenatal Vit-Fe Fumarate-FA (PRENATAL MULTIVITAMIN) TABS tablet Take 1 tablet by mouth daily at 12 noon.    Marland Kitchen SPIRIVA HANDIHALER 18 MCG inhalation capsule Place 1 capsule into inhaler and inhale daily.    . SYMBICORT 80-4.5 MCG/ACT inhaler INHALE 2 PUFFS BY MOUTH INTO THE LUNGS 2TIMES DAILY 10.2 g 3  . tiotropium (SPIRIVA HANDIHALER) 18 MCG inhalation capsule PLACE 1 CAPSULE INTO INHALER AND INHALE ONCE DAILY    . ALPRAZolam (XANAX) 0.25 MG tablet Take 0.25 mg by mouth at bedtime as needed for anxiety.    . Cyanocobalamin (VITAMIN B-12 IJ) Inject 1,000 mcg as directed every 30 (thirty) days.     . fluocinonide (LIDEX) 0.05 % external solution Apply 1 application topically 2 (two) times daily.     Marland Kitchen ipratropium-albuterol (DUONEB) 0.5-2.5 (3) MG/3ML SOLN Take 3 mLs by nebulization every 6 (six) hours as needed (wheezing).     Marland Kitchen loratadine (CLARITIN) 10 MG tablet Take 1 tablet (10 mg total) by mouth daily. (Patient not taking: Reported on 04/22/2020) 30 tablet 0  . PROAIR HFA 108 (90 Base) MCG/ACT inhaler INHALE 2 PUFFS INTO THE LUNGS EVERY 6 (SIX) HOURS AS NEEDED FOR WHEEZING. (Patient not taking: Reported on  04/22/2020) 18 g 2   No current facility-administered medications for this visit.    PHYSICAL EXAMINATION: ECOG PERFORMANCE STATUS: 1 - Symptomatic but completely ambulatory  BP (!) 176/55 (BP Location: Left Arm, Patient Position: Sitting)   Pulse (!) 57   Temp 98.9 F (37.2 C) (Oral)   Resp 16   Wt 114 lb 9.6 oz (52 kg)   SpO2 100%   BMI 23.95 kg/m   Filed Weights   04/22/20 1517  Weight: 114 lb 9.6 oz (52 kg)    Physical Exam  Constitutional: She is oriented to person, place,  and time.  Thin built Caucasian female patient.  Accompanied by daughter.  She is in a wheelchair.  2.5 L of oxygen.  HENT:  Head: Normocephalic and atraumatic.  Mouth/Throat: Oropharynx is clear and moist. No oropharyngeal exudate.  Eyes: Pupils are equal, round, and reactive to light.  Cardiovascular: Normal rate and regular rhythm.  Pulmonary/Chest: No respiratory distress. She has no wheezes.  Decreased air entry bilaterally.  Abdominal: Soft. Bowel sounds are normal. She exhibits no distension and no mass. There is no abdominal tenderness. There is no rebound and no guarding.  Musculoskeletal:        General: No tenderness or edema. Normal range of motion.     Cervical back: Normal range of motion and neck supple.  Neurological: She is alert and oriented to person, place, and time.  Skin: Skin is warm.  Psychiatric: Affect normal.       LABORATORY DATA:  I have reviewed the data as listed    Component Value Date/Time   NA 137 04/22/2020 1442   NA 136 12/29/2014 0827   K 3.9 04/22/2020 1442   K 4.0 12/29/2014 0827   CL 99 04/22/2020 1442   CL 102 12/29/2014 0827   CO2 29 04/22/2020 1442   CO2 28 12/29/2014 0827   GLUCOSE 92 04/22/2020 1442   GLUCOSE 115 (H) 12/29/2014 0827   BUN 15 04/22/2020 1442   BUN 15 12/29/2014 0827   CREATININE 0.83 04/22/2020 1442   CREATININE 1.23 12/29/2014 0827   CALCIUM 8.4 (L) 04/22/2020 1442   CALCIUM 8.8 12/29/2014 0827   PROT 6.2 (L)  04/22/2020 1442   PROT 6.3 (L) 12/01/2014 1355   ALBUMIN 3.4 (L) 04/22/2020 1442   ALBUMIN 3.2 (L) 12/01/2014 1355   AST 24 04/22/2020 1442   AST 14 (L) 12/01/2014 1355   ALT 23 04/22/2020 1442   ALT 22 12/01/2014 1355   ALKPHOS 77 04/22/2020 1442   ALKPHOS 100 12/01/2014 1355   BILITOT 0.5 04/22/2020 1442   BILITOT 0.3 12/01/2014 1355   GFRNONAA >60 04/22/2020 1442   GFRNONAA 45 (L) 12/29/2014 0827   GFRNONAA 42 (L) 08/13/2014 1020   GFRAA >60 04/22/2020 1442   GFRAA 54 (L) 12/29/2014 0827   GFRAA 49 (L) 08/13/2014 1020    No results found for: SPEP, UPEP  Lab Results  Component Value Date   WBC 6.0 04/22/2020   NEUTROABS 3.7 04/22/2020   HGB 10.0 (L) 04/22/2020   HCT 28.7 (L) 04/22/2020   MCV 91.1 04/22/2020   PLT 184 04/22/2020      Chemistry      Component Value Date/Time   NA 137 04/22/2020 1442   NA 136 12/29/2014 0827   K 3.9 04/22/2020 1442   K 4.0 12/29/2014 0827   CL 99 04/22/2020 1442   CL 102 12/29/2014 0827   CO2 29 04/22/2020 1442   CO2 28 12/29/2014 0827   BUN 15 04/22/2020 1442   BUN 15 12/29/2014 0827   CREATININE 0.83 04/22/2020 1442   CREATININE 1.23 12/29/2014 0827      Component Value Date/Time   CALCIUM 8.4 (L) 04/22/2020 1442   CALCIUM 8.8 12/29/2014 0827   ALKPHOS 77 04/22/2020 1442   ALKPHOS 100 12/01/2014 1355   AST 24 04/22/2020 1442   AST 14 (L) 12/01/2014 1355   ALT 23 04/22/2020 1442   ALT 22 12/01/2014 1355   BILITOT 0.5 04/22/2020 1442   BILITOT 0.3 12/01/2014 1355       RADIOGRAPHIC STUDIES: I  have personally reviewed the radiological images as listed and agreed with the findings in the report. No results found.   ASSESSMENT & PLAN:  Cancer of upper lobe of left lung ([place]) # Right upper lobe lung nodule [11 x 8 mm] s/p SBRT in end of July 2017.  CT scan- May 2021-Negative for any recurrence.    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  CT  May 2021-negative for recurrence.  # Pancreatic cyst-February 2021 MRI abdomen shows multiple cysts in the pancreas-pseudocyst-not concerning for malignancy.  Stable repeat in spring 2023.   #Chronic anemia-normocytic; hemoglobin 10.  Stable.  # Dizziness- chronic [[facility] neurology]-recommend involving neurology; stable  #COPD chronic.  Stable/continue follow-up with Dr.Khan.  # DISPOSITION:  # follow up in 6 months- MD- labs-cbc/cmp;iron studies;ferritin: CT chest prior--Dr.B  # I reviewed the blood work- with the patient in detail; also reviewed the imaging independently [as summarized above]; and with the patient in detail.       Orders Placed This Encounter  Procedures  . CT CHEST WO CONTRAST    Standing Status:   Future    Standing Expiration Date:   04/22/2021    Order Specific Question:   Preferred imaging location?    Answer:   [facility]    Order Specific Question:   Radiology Contrast Protocol - do NOT remove file path    Answer:   [email or web address]    Order Specific Question:   ** REASON FOR EXAM (FREE TEXT)    Answer:   lung cancer  . Comprehensive metabolic panel    Standing Status:   Future    Standing Expiration Date:   04/22/2021  . CBC with Differential    Standing Status:   Future    Standing Expiration Date:   04/22/2021  . Ferritin    Standing Status:   Future    Standing Expiration Date:   04/22/2021  . Iron and TIBC    Standing Status:   Future    Standing Expiration Date:   04/22/2021   All questions were answered. The patient knows to call the clinic with any problems, questions or concerns.      Cammie Sickle, MD 04/23/2020 2:54 PM

## 2020-04-22 NOTE — Assessment & Plan Note (Addendum)
#   Right upper lobe lung nodule [11 x 8 mm] s/p SBRT in end of July 2017.  CT scan- May 2021-Negative for any recurrence.    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  CT May 2021-negative for recurrence.  # Pancreatic cyst-February 2021 MRI abdomen shows multiple cysts in the pancreas-pseudocyst-not concerning for malignancy.  Stable repeat in spring 2023.   #Chronic anemia-normocytic; hemoglobin 10.  Stable.  # Dizziness- chronic [[facility] neurology]-recommend involving neurology; stable  #COPD chronic.  Stable/continue follow-up with Dr.Khan.  # DISPOSITION:  # follow up in 6 months- MD- labs-cbc/cmp;iron studies;ferritin: CT chest prior--Dr.B  # I reviewed the blood work- with the patient in detail; also reviewed the imaging independently [as summarized above]; and with the patient in detail.

## 2020-04-22 NOTE — Progress Notes (Signed)
Patient here for oncology follow-up appointment, expresses no complaints or concerns at this time.    

## 2020-04-23 NOTE — Procedures (Signed)
[place], [id number]  DATE OF SERVICE: Apr 15, 2020  Complete Pulmonary Function Testing Interpretation:  FINDINGS:  Forced vital capacity is mildly decreased.  FEV1 is 0.69 L which is 46% of predicted and is severely decreased.  FEV1 FVC ratio is moderately decreased.  Total lung capacity is increased residual volume is increased residual in total lung capacity ratio is increased.  FRC is increased.  Postbronchodilator no significant change in FEV1 clinical improvement may still occur in the absence of spirometric improvement.  IMPRESSION:  This pulmonary function study is consistent with severe obstructive lung disease.  Patient was not able to perform the DLCO maneuver  Allyne Gee, MD [facility] Sleep Medicine

## 2020-05-05 ENCOUNTER — Telehealth: Payer: Self-pay

## 2020-05-05 NOTE — Telephone Encounter (Signed)
Tried contacting patient to confirm appointment on 05/07/2020 no voicemail. klh

## 2020-05-07 ENCOUNTER — Encounter: Payer: Self-pay | Admitting: Internal Medicine

## 2020-05-07 ENCOUNTER — Other Ambulatory Visit: Payer: Self-pay

## 2020-05-07 ENCOUNTER — Ambulatory Visit: Payer: Medicare Other | Admitting: Internal Medicine

## 2020-05-07 VITALS — BP 171/58 | HR 72 | Temp 97.4°F | Resp 16 | Ht <= 58 in | Wt 114.6 lb

## 2020-05-07 DIAGNOSIS — Z9981 Dependence on supplemental oxygen: Secondary | ICD-10-CM

## 2020-05-07 DIAGNOSIS — L409 Psoriasis, unspecified: Secondary | ICD-10-CM

## 2020-05-07 DIAGNOSIS — K219 Gastro-esophageal reflux disease without esophagitis: Secondary | ICD-10-CM

## 2020-05-07 DIAGNOSIS — J449 Chronic obstructive pulmonary disease, unspecified: Secondary | ICD-10-CM

## 2020-05-07 MED ORDER — TRIAMCINOLONE ACETONIDE 0.1 % EX CREA: 1.0000 "application " | TOPICAL_CREAM | Freq: Two times a day (BID) | CUTANEOUS | 2 refills | Status: DC

## 2020-05-07 NOTE — Patient Instructions (Signed)
Chronic Obstructive Pulmonary Disease Chronic obstructive pulmonary disease (COPD) is a long-term (chronic) lung problem. When you have COPD, it is hard for air to get in and out of your lungs. Usually the condition gets worse over time, and your lungs will never return to normal. There are things you can do to keep yourself as healthy as possible.  Your doctor may treat your condition with: ? Medicines. ? Oxygen. ? Lung surgery.  Your doctor may also recommend: ? Rehabilitation. This includes steps to make your body work better. It may involve a team of specialists. ? Quitting smoking, if you smoke. ? Exercise and changes to your diet. ? Comfort measures (palliative care). Follow these instructions at home: Medicines  Take over-the-counter and prescription medicines only as told by your doctor.  Talk to your doctor before taking any cough or allergy medicines. You may need to avoid medicines that cause your lungs to be dry. Lifestyle  If you smoke, stop. Smoking makes the problem worse. If you need help quitting, ask your doctor.  Avoid being around things that make your breathing worse. This may include smoke, chemicals, and fumes.  Stay active, but remember to rest as well.  Learn and use tips on how to relax.  Make sure you get enough sleep. Most adults need at least 7 hours of sleep every night.  Eat healthy foods. Eat smaller meals more often. Rest before meals. Controlled breathing Learn and use tips on how to control your breathing as told by your doctor. Try:  Breathing in (inhaling) through your nose for 1 second. Then, pucker your lips and breath out (exhale) through your lips for 2 seconds.  Putting one hand on your belly (abdomen). Breathe in slowly through your nose for 1 second. Your hand on your belly should move out. Pucker your lips and breathe out slowly through your lips. Your hand on your belly should move in as you breathe out.  Controlled coughing Learn  and use controlled coughing to clear mucus from your lungs. Follow these steps: 1. Lean your head a little forward. 2. Breathe in deeply. 3. Try to hold your breath for 3 seconds. 4. Keep your mouth slightly open while coughing 2 times. 5. Spit any mucus out into a tissue. 6. Rest and do the steps again 1 or 2 times as needed. General instructions  Make sure you get all the shots (vaccines) that your doctor recommends. Ask your doctor about a flu shot and a pneumonia shot.  Use oxygen therapy and pulmonary rehabilitation if told by your doctor. If you need home oxygen therapy, ask your doctor if you should buy a tool to measure your oxygen level (oximeter).  Make a COPD action plan with your doctor. This helps you to know what to do if you feel worse than usual.  Manage any other conditions you have as told by your doctor.  Avoid going outside when it is very hot, cold, or humid.  Avoid people who have a sickness you can catch (contagious).  Keep all follow-up visits as told by your doctor. This is important. Contact a doctor if:  You cough up more mucus than usual.  There is a change in the color or thickness of the mucus.  It is harder to breathe than usual.  Your breathing is faster than usual.  You have trouble sleeping.  You need to use your medicines more often than usual.  You have trouble doing your normal activities such as getting dressed   or walking around the house. Get help right away if:  You have shortness of breath while resting.  You have shortness of breath that stops you from: ? Being able to talk. ? Doing normal activities.  Your chest hurts for longer than 5 minutes.  Your skin color is more blue than usual.  Your pulse oximeter shows that you have low oxygen for longer than 5 minutes.  You have a fever.  You feel too tired to breathe normally. Summary  Chronic obstructive pulmonary disease (COPD) is a long-term lung problem.  The way your  lungs work will never return to normal. Usually the condition gets worse over time. There are things you can do to keep yourself as healthy as possible.  Take over-the-counter and prescription medicines only as told by your doctor.  If you smoke, stop. Smoking makes the problem worse. This information is not intended to replace advice given to you by your health care provider. Make sure you discuss any questions you have with your health care provider. Document Revised: 11/03/2017 Document Reviewed: 12/26/2016 Elsevier Patient Education  2020 [employer]  

## 2020-05-07 NOTE — Progress Notes (Signed)
error 

## 2020-06-29 ENCOUNTER — Other Ambulatory Visit: Payer: Self-pay | Admitting: Internal Medicine

## 2020-10-23 ENCOUNTER — Other Ambulatory Visit: Payer: Self-pay

## 2020-10-23 ENCOUNTER — Ambulatory Visit
Admission: RE | Admit: 2020-10-23 | Discharge: 2020-10-23 | Disposition: A | Discharge status: Home / Self Care | Payer: Medicare Other | Source: Ambulatory Visit | Attending: Internal Medicine | Admitting: Internal Medicine

## 2020-10-23 DIAGNOSIS — C3412 Malignant neoplasm of upper lobe, left bronchus or lung: Secondary | ICD-10-CM | POA: Diagnosis not present

## 2020-10-26 ENCOUNTER — Encounter: Payer: Self-pay | Admitting: Internal Medicine

## 2020-10-26 ENCOUNTER — Inpatient Hospital Stay: Payer: Medicare Other | Attending: Internal Medicine

## 2020-10-26 ENCOUNTER — Other Ambulatory Visit: Payer: Self-pay

## 2020-10-26 ENCOUNTER — Inpatient Hospital Stay (HOSPITAL_BASED_OUTPATIENT_CLINIC_OR_DEPARTMENT_OTHER): Payer: Medicare Other | Admitting: Internal Medicine

## 2020-10-26 DIAGNOSIS — C3412 Malignant neoplasm of upper lobe, left bronchus or lung: Secondary | ICD-10-CM

## 2020-10-26 DIAGNOSIS — R42 Dizziness and giddiness: Secondary | ICD-10-CM | POA: Insufficient documentation

## 2020-10-26 DIAGNOSIS — Z800 Family history of malignant neoplasm of digestive organs: Secondary | ICD-10-CM | POA: Insufficient documentation

## 2020-10-26 DIAGNOSIS — K863 Pseudocyst of pancreas: Secondary | ICD-10-CM | POA: Insufficient documentation

## 2020-10-26 DIAGNOSIS — Z9221 Personal history of antineoplastic chemotherapy: Secondary | ICD-10-CM | POA: Diagnosis not present

## 2020-10-26 DIAGNOSIS — Z9981 Dependence on supplemental oxygen: Secondary | ICD-10-CM | POA: Insufficient documentation

## 2020-10-26 DIAGNOSIS — D649 Anemia, unspecified: Secondary | ICD-10-CM | POA: Insufficient documentation

## 2020-10-26 DIAGNOSIS — Z809 Family history of malignant neoplasm, unspecified: Secondary | ICD-10-CM | POA: Diagnosis not present

## 2020-10-26 DIAGNOSIS — R040 Epistaxis: Secondary | ICD-10-CM | POA: Diagnosis not present

## 2020-10-26 DIAGNOSIS — F419 Anxiety disorder, unspecified: Secondary | ICD-10-CM | POA: Diagnosis not present

## 2020-10-26 DIAGNOSIS — Z87891 Personal history of nicotine dependence: Secondary | ICD-10-CM | POA: Diagnosis not present

## 2020-10-26 DIAGNOSIS — J449 Chronic obstructive pulmonary disease, unspecified: Secondary | ICD-10-CM | POA: Diagnosis not present

## 2020-10-26 DIAGNOSIS — Z923 Personal history of irradiation: Secondary | ICD-10-CM | POA: Insufficient documentation

## 2020-10-26 LAB — CBC WITH DIFFERENTIAL/PLATELET
Abs Immature Granulocytes: 0.01 10*3/uL (ref 0.00–0.07)
Basophils Absolute: 0 10*3/uL (ref 0.0–0.1)
Basophils Relative: 1 %
Eosinophils Absolute: 0.1 10*3/uL (ref 0.0–0.5)
Eosinophils Relative: 1 %
HCT: 29.9 % — ABNORMAL LOW (ref 36.0–46.0)
Hemoglobin: 10.3 g/dL — ABNORMAL LOW (ref 12.0–15.0)
Immature Granulocytes: 0 %
Lymphocytes Relative: 28 %
Lymphs Abs: 1.6 10*3/uL (ref 0.7–4.0)
MCH: 31.9 pg (ref 26.0–34.0)
MCHC: 34.4 g/dL (ref 30.0–36.0)
MCV: 92.6 fL (ref 80.0–100.0)
Monocytes Absolute: 0.6 10*3/uL (ref 0.1–1.0)
Monocytes Relative: 11 %
Neutro Abs: 3.4 10*3/uL (ref 1.7–7.7)
Neutrophils Relative %: 59 %
Platelets: 210 10*3/uL (ref 150–400)
RBC: 3.23 MIL/uL — ABNORMAL LOW (ref 3.87–5.11)
RDW: 13.2 % (ref 11.5–15.5)
WBC: 5.7 10*3/uL (ref 4.0–10.5)
nRBC: 0.9 % — ABNORMAL HIGH (ref 0.0–0.2)

## 2020-10-26 LAB — COMPREHENSIVE METABOLIC PANEL
ALT: 18 U/L (ref 0–44)
AST: 23 U/L (ref 15–41)
Albumin: 3.5 g/dL (ref 3.5–5.0)
Alkaline Phosphatase: 66 U/L (ref 38–126)
Anion gap: 11 (ref 5–15)
BUN: 12 mg/dL (ref 8–23)
CO2: 27 mmol/L (ref 22–32)
Calcium: 8.5 mg/dL — ABNORMAL LOW (ref 8.9–10.3)
Chloride: 100 mmol/L (ref 98–111)
Creatinine, Ser: 0.96 mg/dL (ref 0.44–1.00)
GFR, Estimated: 58 mL/min — ABNORMAL LOW (ref >60)
Glucose, Bld: 108 mg/dL — ABNORMAL HIGH (ref 70–99)
Potassium: 4.1 mmol/L (ref 3.5–5.1)
Sodium: 138 mmol/L (ref 135–145)
Total Bilirubin: 0.7 mg/dL (ref 0.3–1.2)
Total Protein: 6.5 g/dL (ref 6.5–8.1)

## 2020-10-26 LAB — IRON AND TIBC
Iron: 94 ug/dL (ref 28–170)
Saturation Ratios: 31 % (ref 10.4–31.8)
TIBC: 307 ug/dL (ref 250–450)
UIBC: 213 ug/dL

## 2020-10-26 LAB — FERRITIN: Ferritin: 94 ng/mL (ref 11–307)

## 2020-10-26 MED ORDER — SYMBICORT 80-4.5 MCG/ACT IN AERO: INHALATION_SPRAY | RESPIRATORY_TRACT | 1 refills | Status: DC

## 2020-10-26 NOTE — Progress Notes (Signed)
Recently has not been able to get around like she usually does. Has dizziness and weakness. Feels like she is moving slower and has more trouble with SOB. Feels like her "blood is low"  Has had 2 really bad nose bleeds recently.    Would like a refill on xanax

## 2020-10-26 NOTE — Progress Notes (Signed)
[facility] OFFICE PROGRESS NOTE  Patient Care Team: Baxter Hire, [name]  Cancer Staging No matching staging information was found for the patient.   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  # COPD Home O2 2.5L -  # 2019-2020-mild anemia- Hb-10; no IDA; colo-"many years ago".   # DIAGNOSIS: Lung cancer- LUL stage III/ RUL stage I  GOALS: cuartive  CURRENT/MOST RECENT THERAPY; surveillaince    Cancer of upper lobe of left lung (HCC)    INTERVAL HISTORY:  Bethany Lambert 86 y.o.  female pleasant patient above history of lung cancer/COPD pancreatic cysts is here for follow-up/review results of CT chest.  Patient continues to complain of chronic shortness of breath.  Chronic fatigue.  Denies abdominal pain.  Denies any nausea vomiting.  Complains of intermittent nosebleeds.  Otherwise denies any rectal bleeding or any gum bleeding.  Continues to have chronic dizziness.  Not any worse.  Complains of difficulty sleeping.  She continues to be on oxygen 24 x 7.   Review of Systems  Constitutional: Positive for malaise/fatigue. Negative for chills, diaphoresis, fever and weight loss.  HENT: Negative for nosebleeds and sore throat.   Eyes: Negative for double vision.  Respiratory: Positive for cough and shortness of breath. Negative for hemoptysis and sputum production.   Cardiovascular: Negative for chest pain, palpitations, orthopnea and leg swelling.  Gastrointestinal: Negative for abdominal pain, blood in stool, constipation, diarrhea, heartburn, melena,  nausea and vomiting.  Genitourinary: Negative for dysuria, frequency and urgency.  Musculoskeletal: Positive for back pain and joint pain.  Skin: Negative.  Negative for itching and rash.  Neurological: Negative for dizziness, tingling, focal weakness, weakness and headaches.  Endo/Heme/Allergies: Does not bruise/bleed easily.  Psychiatric/Behavioral: Positive for memory loss. Negative for depression. The patient is not nervous/anxious and does not have insomnia.     PAST MEDICAL HISTORY :  Past Medical History:  Diagnosis Date  . 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 COPD (chronic obstructive pulmonary disease) (HCC)    on 2L home o2  . Dizziness   . Erythrocytosis   . GERD (gastroesophageal reflux disease)   . History of chemotherapy   . History of radiation therapy   . Hyperlipidemia   . Hypertension   . Kidney failure   . Polycythemia   . Psoriasis     PAST SURGICAL HISTORY :   Past Surgical History:  Procedure Laterality Date  . CATARACT EXTRACTION    . COLON RESECTION    . CT GUIDED BIOPSY  ([facility] HX)  11/19/2012   lung  . HERNIA REPAIR    . TOTAL ABDOMINAL HYSTERECTOMY      FAMILY HISTORY :   Family History  Problem Relation Age of Onset  . Heart attack Father   . Colon cancer Father   . Stroke Mother   . Cancer Son     SOCIAL HISTORY:   Social History   Tobacco Use  . Smoking status: Former Smoker  Packs/day: 2.00    Years: 40.00    Pack years: 80.00    Types: Cigarettes    Quit date: 06/22/2001    Years since quitting: 19.3  . Smokeless tobacco: Never Used  Vaping Use  . Vaping Use: Never used  Substance Use Topics  . Alcohol use: No    Alcohol/week: 0.0 standard drinks  . Drug use: No    ALLERGIES:  is allergic to ciprofloxacin and norvasc [amlodipine besylate].  MEDICATIONS:  Current Outpatient Medications  Medication Sig Dispense Refill  . ALPRAZolam (XANAX) 0.25 MG tablet Take 0.25  mg by mouth at bedtime as needed for anxiety.    . ALPRAZolam (XANAX) 0.25 MG tablet TAKE 1 TABLET BY MOUTH 2 TIMES DAILY AS NEEDED FOR ANXIETY FOR UP TO 30 DAYS    . Cyanocobalamin (VITAMIN B-12 IJ) Inject 1,000 mcg as directed every 30 (thirty) days.     Marland Kitchen diltiazem (CARDIZEM CD) 120 MG 24 hr capsule   2  . docusate sodium (COLACE) 100 MG capsule Take 100-200 mg by mouth 2 (two) times daily. 100 mg in the morning and 200 mg at bedtime    . ipratropium-albuterol (DUONEB) 0.5-2.5 (3) MG/3ML SOLN Take 3 mLs by nebulization every 6 (six) hours as needed (wheezing).     Marland Kitchen losartan (COZAAR) 50 MG tablet Take by mouth.    . metoprolol (LOPRESSOR) 50 MG tablet Take 1 tablet (50 mg total) by mouth 2 (two) times daily.    . metoprolol tartrate (LOPRESSOR) 50 MG tablet Take by mouth.    Marland Kitchen omeprazole (PRILOSEC) 20 MG capsule Take 20 mg by mouth 2 (two) times daily before a meal.     . omeprazole (PRILOSEC) 20 MG capsule Take by mouth.    . OXYGEN Inhale 3 L into the lungs continuous.     . potassium chloride (K-DUR) 10 MEQ tablet 10 mEq daily.   11  . potassium chloride (KLOR-CON) 10 MEQ tablet TAKE 1 TABLET BY MOUTH DAILY    . Prenatal Vit-Fe Fumarate-FA (PRENATAL MULTIVITAMIN) TABS tablet Take 1 tablet by mouth daily at 12 noon.    Marland Kitchen PROAIR HFA 108 (90 Base) MCG/ACT inhaler INHALE 2 PUFFS INTO THE LUNGS EVERY 6 (SIX) HOURS AS NEEDED FOR WHEEZING. 18 g 2  . SPIRIVA HANDIHALER 18 MCG inhalation capsule Place 1 capsule into inhaler and inhale daily.    Marland Kitchen tiotropium (SPIRIVA HANDIHALER) 18 MCG inhalation capsule PLACE 1 CAPSULE INTO INHALER AND INHALE ONCE DAILY    . SYMBICORT 80-4.5 MCG/ACT inhaler INHALE 2 PUFFS BY MOUTH INTO THE LUNGS 2TIMES DAILY 10.2 g 1   No current facility-administered medications for this visit.    PHYSICAL EXAMINATION: ECOG PERFORMANCE STATUS: 1 - Symptomatic but completely ambulatory  BP (!) 153/67 (BP Location: Left Arm, Patient Position: Sitting, Cuff Size: Normal)    Pulse 70   Temp 98.2 F (36.8 C) (Tympanic)   Resp 16   Ht 4\' 10"  (1.473 m)   Wt 109 lb (49.4 kg)   SpO2 100%   BMI 22.78 kg/m   Filed Weights   10/26/20 1429  Weight: 109 lb (49.4 kg)    Physical Exam Constitutional:      Comments: Thin built Caucasian female patient.  Accompanied by daughter.  She is in a wheelchair.  2.5 L of oxygen.  HENT:     Head: Normocephalic and atraumatic.     Mouth/Throat:     Pharynx: No oropharyngeal exudate.  Eyes:     Pupils:  Pupils are equal, round, and reactive to light.  Cardiovascular:     Rate and Rhythm: Normal rate and regular rhythm.  Pulmonary:     Effort: No respiratory distress.     Breath sounds: No wheezing.  Abdominal:     General: Bowel sounds are normal. There is no distension.     Palpations: Abdomen is soft. There is no mass.     Tenderness: There is no abdominal tenderness. There is no guarding or rebound.  Musculoskeletal:        General: No tenderness. Normal range of motion.     Cervical back: Normal range of motion and neck supple.  Skin:    General: Skin is warm.  Neurological:     Mental Status: She is alert and oriented to person, place, and time.  Psychiatric:        Mood and Affect: Affect normal.        LABORATORY DATA:  I have reviewed the data as listed    Component Value Date/Time   NA 138 10/26/2020 1418   NA 136 12/29/2014 0827   K 4.1 10/26/2020 1418   K 4.0 12/29/2014 0827   CL 100 10/26/2020 1418   CL 102 12/29/2014 0827   CO2 27 10/26/2020 1418   CO2 28 12/29/2014 0827   GLUCOSE 108 (H) 10/26/2020 1418   GLUCOSE 115 (H) 12/29/2014 0827   BUN 12 10/26/2020 1418   BUN 15 12/29/2014 0827   CREATININE 0.96 10/26/2020 1418   CREATININE 1.23 12/29/2014 0827   CALCIUM 8.5 (L) 10/26/2020 1418   CALCIUM 8.8 12/29/2014 0827   PROT 6.5 10/26/2020 1418   PROT 6.3 (L) 12/01/2014 1355   ALBUMIN 3.5 10/26/2020 1418   ALBUMIN 3.2 (L) 12/01/2014 1355   AST 23 10/26/2020 1418   AST 14 (L)  12/01/2014 1355   ALT 18 10/26/2020 1418   ALT 22 12/01/2014 1355   ALKPHOS 66 10/26/2020 1418   ALKPHOS 100 12/01/2014 1355   BILITOT 0.7 10/26/2020 1418   BILITOT 0.3 12/01/2014 1355   GFRNONAA 58 (L) 10/26/2020 1418   GFRNONAA 45 (L) 12/29/2014 0827   GFRNONAA 42 (L) 08/13/2014 1020   GFRAA >60 04/22/2020 1442   GFRAA 54 (L) 12/29/2014 0827   GFRAA 49 (L) 08/13/2014 1020    No results found for: SPEP, UPEP  Lab Results  Component Value Date   WBC 5.7 10/26/2020   NEUTROABS 3.4 10/26/2020   HGB 10.3 (L) 10/26/2020   HCT 29.9 (L) 10/26/2020   MCV 92.6 10/26/2020   PLT 210 10/26/2020      Chemistry      Component Value Date/Time   NA 138 10/26/2020 1418   NA 136 12/29/2014 0827   K 4.1 10/26/2020 1418   K 4.0 12/29/2014 0827   CL 100 10/26/2020 1418   CL 102 12/29/2014 0827   CO2 27 10/26/2020 1418   CO2 28 12/29/2014 0827   BUN 12 10/26/2020 1418   BUN 15 12/29/2014 0827   CREATININE 0.96 10/26/2020 1418   CREATININE 1.23 12/29/2014 0827      Component Value Date/Time   CALCIUM 8.5 (L) 10/26/2020 1418   CALCIUM 8.8 12/29/2014 0827   ALKPHOS 66 10/26/2020 1418   ALKPHOS 100 12/01/2014 1355   AST 23 10/26/2020 1418   AST 14 (L) 12/01/2014 1355   ALT 18 10/26/2020 1418   ALT 22 12/01/2014 1355   BILITOT 0.7 10/26/2020 1418   BILITOT 0.3 12/01/2014 1355  RADIOGRAPHIC STUDIES: I have personally reviewed the radiological images as listed and agreed with the findings in the report. No results found.   ASSESSMENT & PLAN:  Cancer of upper lobe of left lung ([place]) # Right upper lobe lung nodule [11 x 8 mm] s/p SBRT in end of July 2017.  CT scan- NOV 2021- # a] medial RLL- Progressive 8 mm nodule; b] central RML15 mm ill-defined sub solid opacity mildly progressive; c] Anterior subpleural RUL 7 x 14 mm subpleural- mildly Progressive.   #Reviewed above findings with the patient and her son-in-law in detail.  Concerns for progressive disease however given  patient's underlying tenuous respiratory status; and fairly small lesions noted I think is reasonable to continue surveillance.  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 CT NOV 2021- see above.   # Pancreatic cyst-February 2021 MRI abdomen shows multiple cysts in the pancreas-pseudocyst-not concerning for malignancy. STABLE; repeat in spring 2023.   #Chronic anemia-normocytic; hemoglobin 10.  STABLE.   # Dizziness- chronic [ENT/[facility] neurology]-recommend involving neurology-STABLE.    #COPD chronicon [facility]- STABLE/continue follow-up with Dr.Khan.  # Anxiety/sleep- recommend Buspar/continue melatonin 10 mg nightly.  I am concerned about addition of other sleep aids given her chronic dizziness elderly age and risk of falls.  # Nose bleeds-question secondary to dryness of the nasal mucosa from her oxygen.  Recommend Vaseline; nasal saline spray.  # DISPOSITION:  # follow up in 6 months- MD- labs-cbc/cmp;iron studies;ferritin: CT chest prior--Dr.B  # I reviewed the blood work- with the patient/ and her son-in-law in detail; also reviewed the imaging independently [as summarized above]; and with the patient in detail.         Orders Placed This Encounter  Procedures  . CT Chest W Contrast    Standing Status:   Future    Standing Expiration Date:   10/26/2021    Order Specific Question:   If indicated for the ordered procedure, I authorize the administration of contrast media per Radiology protocol    Answer:   Yes    Order Specific Question:   Preferred imaging location?    Answer:   [facility], [place]   All questions were answered. The patient knows to call the clinic with any problems, questions or concerns.      Cammie Sickle, MD 11/02/2020 8:27 AM

## 2020-10-26 NOTE — Assessment & Plan Note (Addendum)
#   Right upper lobe lung nodule [11 x 8 mm] s/p SBRT in end of July 2017.  CT scan- NOV 2021- # a] medial RLL- Progressive 8 mm nodule; b] central RML15 mm ill-defined sub solid opacity mildly progressive; c] Anterior subpleural RUL 7 x 14 mm subpleural- mildly Progressive.   #Reviewed above findings with the patient and her son-in-law in detail.  Concerns for progressive disease however given patient's underlying tenuous respiratory status; and fairly small lesions noted I think is reasonable to continue surveillance.  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 CT NOV 2021- see above.   # Pancreatic cyst-February 2021 MRI abdomen shows multiple cysts in the pancreas-pseudocyst-not concerning for malignancy. STABLE; repeat in spring 2023.   #Chronic anemia-normocytic; hemoglobin 10.  STABLE.   # Dizziness- chronic [[facility] neurology]-recommend involving neurology-STABLE.    #COPD chronicon [facility]- STABLE/continue follow-up with Dr.Khan.  # Anxiety/sleep- recommend Buspar/continue melatonin 10 mg nightly.  I am concerned about addition of other sleep aids given her chronic dizziness elderly age and risk of falls.  # Nose bleeds-question secondary to dryness of the nasal mucosa from her oxygen.  Recommend Vaseline; nasal saline spray.  # DISPOSITION:  # follow up in 6 months- MD- labs-cbc/cmp;iron studies;ferritin: CT chest prior--Dr.B  # I reviewed the blood work- with the patient/ and her son-in-law in detail; also reviewed the imaging independently [as summarized above]; and with the patient in detail.

## 2020-11-09 ENCOUNTER — Ambulatory Visit: Payer: Medicare Other | Admitting: Hospice and Palliative Medicine

## 2020-11-09 ENCOUNTER — Encounter: Payer: Self-pay | Admitting: Hospice and Palliative Medicine

## 2020-11-09 ENCOUNTER — Other Ambulatory Visit: Payer: Self-pay

## 2020-11-09 DIAGNOSIS — K219 Gastro-esophageal reflux disease without esophagitis: Secondary | ICD-10-CM | POA: Diagnosis not present

## 2020-11-09 DIAGNOSIS — J449 Chronic obstructive pulmonary disease, unspecified: Secondary | ICD-10-CM

## 2020-11-09 DIAGNOSIS — J9610 Chronic respiratory failure, unspecified whether with hypoxia or hypercapnia: Secondary | ICD-10-CM | POA: Diagnosis not present

## 2020-11-09 DIAGNOSIS — R0602 Shortness of breath: Secondary | ICD-10-CM

## 2020-11-09 DIAGNOSIS — C3412 Malignant neoplasm of upper lobe, left bronchus or lung: Secondary | ICD-10-CM

## 2020-11-09 MED ORDER — SPIRIVA HANDIHALER 18 MCG IN CAPS: 1.0000 | ORAL_CAPSULE | Freq: Every day | RESPIRATORY_TRACT | 3 refills | Status: DC

## 2020-11-09 MED ORDER — SYMBICORT 80-4.5 MCG/ACT IN AERO: INHALATION_SPRAY | RESPIRATORY_TRACT | 3 refills | Status: DC

## 2020-11-09 NOTE — Progress Notes (Signed)
[facility] ([facility] [place], [place] [address]  Pulmonary Sleep Medicine   Office Visit Note  Patient Name: Bethany Lambert DOB: [birth year] MRN [id number]  Date of Service: 11/10/2020  Complaints/HPI: Patient is here for routine pulmonary follow-up Followed for COPD, lung CA and GERD Under surveillance therapy with oncology for history of squamous cell stage IIIA-invading mediastinum, s/p chemotherapy and radiation-scheduled for follow-up chest CT May 2022 COPD-breathing reportedly has remained stable since last follow-up, continues to wear 2.5-3LPM via nasal cannula supplemental oxygen at all times PFT 04/2020-severe obstructive lung disease, unable to perform DLCO therapy Daily use of Symbicort, Spiriva and nebulizer therapy    ROS  General: (-) fever, (-) chills, (-) night sweats, (-) weakness Skin: (-) rashes, (-) itching,. Eyes: (-) visual changes, (-) redness, (-) itching. Nose and Sinuses: (-) nasal stuffiness or itchiness, (-) postnasal drip, (-) nosebleeds, (-) sinus trouble. Mouth and Throat: (-) sore throat, (-) hoarseness. Neck: (-) swollen glands, (-) enlarged thyroid, (-) neck pain. Respiratory: - cough, (-) bloody sputum, + shortness of breath, - wheezing. Cardiovascular: - ankle swelling, (-) chest pain. Lymphatic: (-) lymph node enlargement. Neurologic: (-) numbness, (-) tingling. Psychiatric: (-) anxiety, (-) depression   Current Medication: Outpatient Encounter Medications as of 11/09/2020  Medication Sig  . ALPRAZolam (XANAX) 0.25 MG tablet Take 0.25 mg by mouth at bedtime as needed for anxiety.  . ALPRAZolam (XANAX) 0.25 MG tablet TAKE 1 TABLET BY MOUTH 2 TIMES DAILY AS NEEDED FOR ANXIETY FOR UP TO 30 DAYS  . Cyanocobalamin (VITAMIN B-12 IJ) Inject 1,000 mcg as directed every 30 (thirty) days.   Marland Kitchen diltiazem (CARDIZEM CD) 120 MG 24 hr capsule   . docusate sodium (COLACE) 100 MG capsule Take 100-200 mg by mouth 2 (two) times daily. 100  mg in the morning and 200 mg at bedtime  . ipratropium-albuterol (DUONEB) 0.5-2.5 (3) MG/3ML SOLN Take 3 mLs by nebulization every 6 (six) hours as needed (wheezing).   . metoprolol (LOPRESSOR) 50 MG tablet Take 1 tablet (50 mg total) by mouth 2 (two) times daily.  . metoprolol tartrate (LOPRESSOR) 50 MG tablet Take by mouth.  Marland Kitchen omeprazole (PRILOSEC) 20 MG capsule Take 20 mg by mouth 2 (two) times daily before a meal.   . omeprazole (PRILOSEC) 20 MG capsule Take by mouth.  . OXYGEN Inhale 3 L into the lungs continuous.   . potassium chloride (K-DUR) 10 MEQ tablet 10 mEq daily.   . potassium chloride (KLOR-CON) 10 MEQ tablet TAKE 1 TABLET BY MOUTH DAILY  . Prenatal Vit-Fe Fumarate-FA (PRENATAL MULTIVITAMIN) TABS tablet Take 1 tablet by mouth daily at 12 noon.  Marland Kitchen PROAIR HFA 108 (90 Base) MCG/ACT inhaler INHALE 2 PUFFS INTO THE LUNGS EVERY 6 (SIX) HOURS AS NEEDED FOR WHEEZING.  . SPIRIVA HANDIHALER 18 MCG inhalation capsule Place 1 capsule (18 mcg total) into inhaler and inhale daily.  . SYMBICORT 80-4.5 MCG/ACT inhaler INHALE 2 PUFFS BY MOUTH INTO THE LUNGS 2TIMES DAILY  . tiotropium (SPIRIVA HANDIHALER) 18 MCG inhalation capsule PLACE 1 CAPSULE INTO INHALER AND INHALE ONCE DAILY  . [DISCONTINUED] SPIRIVA HANDIHALER 18 MCG inhalation capsule Place 1 capsule into inhaler and inhale daily.  . [DISCONTINUED] SYMBICORT 80-4.5 MCG/ACT inhaler INHALE 2 PUFFS BY MOUTH INTO THE LUNGS 2TIMES DAILY  . losartan (COZAAR) 50 MG tablet Take by mouth.   No facility-administered encounter medications on file as of 11/09/2020.    Surgical History: Past Surgical History:  Procedure Laterality Date  . CATARACT EXTRACTION    .  COLON RESECTION    . CT GUIDED BIOPSY  ([facility] HX)  11/19/2012   lung  . HERNIA REPAIR    . TOTAL ABDOMINAL HYSTERECTOMY      Medical History: Past Medical History:  Diagnosis Date  . 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 COPD  (chronic obstructive pulmonary disease) (HCC)    on 2L home o2  . Dizziness   . Erythrocytosis   . GERD (gastroesophageal reflux disease)   . History of chemotherapy   . History of radiation therapy   . Hyperlipidemia   . Hypertension   . Kidney failure   . Polycythemia   . Psoriasis     Family History: Family History  Problem Relation Age of Onset  . Heart attack Father   . Colon cancer Father   . Stroke Mother   . Cancer Son     Social History: Social History   Socioeconomic History  . Marital status: Widowed    Spouse name: Not on file  . Number of children: Not on file  . Years of education: Not on file  . Highest education level: Not on file  Occupational History  . Not on file  Tobacco Use  . Smoking status: Former Smoker    Packs/day: 2.00    Years: 40.00    Pack years: 80.00    Types: Cigarettes    Quit date: 06/22/2001    Years since quitting: 19.4  . Smokeless tobacco: Never Used  Vaping Use  . Vaping Use: Never used  Substance and Sexual Activity  . Alcohol use: No    Alcohol/week: 0.0 standard drinks  . Drug use: No  . Sexual activity: Not on file  Other Topics Concern  . Not on file  Social History Narrative   Lives at home with her son.  Has a walker but mostly gets around without it.   Social Determinants of Health   Financial Resource Strain:   . Difficulty of Paying Living Expenses: Not on file  Food Insecurity:   . Worried About Charity fundraiser in the Last Year: Not on file  . Ran Out of Food in the Last Year: Not on file  Transportation Needs:   . Lack of Transportation (Medical): Not on file  . Lack of Transportation (Non-Medical): Not on file  Physical Activity:   . Days of Exercise per Week: Not on file  . Minutes of Exercise per Session: Not on file  Stress:   . Feeling of Stress : Not on file  Social Connections:   . Frequency of Communication with Friends and Family: Not on file  . Frequency of Social Gatherings with  Friends and Family: Not on file  . Attends Religious Services: Not on file  . Active Member of Clubs or Organizations: Not on file  . Attends Archivist Meetings: Not on file  . Marital Status: Not on file  Intimate Partner Violence:   . Fear of Current or Ex-Partner: Not on file  . Emotionally Abused: Not on file  . Physically Abused: Not on file  . Sexually Abused: Not on file    Vital Signs: Blood pressure (!) 174/68, pulse 70, temperature 98 F (36.7 C), resp. rate 16, height 4\' 10"  (1.473 m), weight 112 lb 3.2 oz (50.9 kg), SpO2 99 %.  Examination: General Appearance: The patient is well-developed, well-nourished, and in no distress. Skin: Gross inspection  of skin unremarkable. Head: normocephalic, no gross deformities. Eyes: no gross deformities noted. ENT: ears appear grossly normal no exudates. Neck: Supple. No thyromegaly. No LAD. Respiratory: Diminished throughout, no rhonchi, wheezing or rales noted. Cardiovascular: Normal S1 and S2 without murmur or rub. Extremities: No cyanosis. pulses are equal. Neurologic: Alert and oriented. No involuntary movements.  LABS: Recent Results (from the past 2160 hour(s))  Iron and TIBC     Status: None   Collection Time: 10/26/20  2:18 PM  Result Value Ref Range   Iron 94 28 - 170 ug/dL   TIBC 307 250 - 450 ug/dL   Saturation Ratios 31 10.4 - 31.8 %   UIBC 213 ug/dL    Comment: Performed at [facility], [address] [place], [place] [address]  Ferritin     Status: None   Collection Time: 10/26/20  2:18 PM  Result Value Ref Range   Ferritin 94 11 - 307 ng/mL    Comment: Performed at [facility], [address] [place], [place] [address]  CBC with Differential     Status: Abnormal   Collection Time: 10/26/20  2:18 PM  Result Value Ref Range   WBC 5.7 4.0 - 10.5 K/uL   RBC 3.23 (L) 3.87 - 5.11 MIL/uL   Hemoglobin 10.3 (L) 12.0 - 15.0 g/dL   HCT 29.9 (L) 36 - 46 %   MCV 92.6 80.0 - 100.0 fL    MCH 31.9 26.0 - 34.0 pg   MCHC 34.4 30.0 - 36.0 g/dL   RDW 13.2 11.5 - 15.5 %   Platelets 210 150 - 400 K/uL   nRBC 0.9 (H) 0.0 - 0.2 %   Neutrophils Relative % 59 %   Neutro Abs 3.4 1.7 - 7.7 K/uL   Lymphocytes Relative 28 %   Lymphs Abs 1.6 0.7 - 4.0 K/uL   Monocytes Relative 11 %   Monocytes Absolute 0.6 0.1 - 1.0 K/uL   Eosinophils Relative 1 %   Eosinophils Absolute 0.1 0.0 - 0.5 K/uL   Basophils Relative 1 %   Basophils Absolute 0.0 0.0 - 0.1 K/uL   Immature Granulocytes 0 %   Abs Immature Granulocytes 0.01 0.00 - 0.07 K/uL    Comment: Performed at [facility], [address] [place], [place] [address]  Comprehensive metabolic panel     Status: Abnormal   Collection Time: 10/26/20  2:18 PM  Result Value Ref Range   Sodium 138 135 - 145 mmol/L   Potassium 4.1 3.5 - 5.1 mmol/L   Chloride 100 98 - 111 mmol/L   CO2 27 22 - 32 mmol/L   Glucose, Bld 108 (H) 70 - 99 mg/dL    Comment: Glucose reference range applies only to samples taken after fasting for at least 8 hours.   BUN 12 8 - 23 mg/dL   Creatinine, Ser 0.96 0.44 - 1.00 mg/dL   Calcium 8.5 (L) 8.9 - 10.3 mg/dL   Total Protein 6.5 6.5 - 8.1 g/dL   Albumin 3.5 3.5 - 5.0 g/dL   AST 23 15 - 41 U/L   ALT 18 0 - 44 U/L   Alkaline Phosphatase 66 38 - 126 U/L   Total Bilirubin 0.7 0.3 - 1.2 mg/dL   GFR, Estimated 58 (L) >60 mL/min    Comment: (NOTE) Calculated using the CKD-EPI Creatinine Equation (2021)    Anion gap 11 5 - 15    Comment: Performed at [facility], [address] [place],  [place] [address]    Radiology: CT CHEST WO CONTRAST  Result Date: 10/25/2020 CLINICAL DATA:  Recurrent lung cancer, status post chemotherapy and radiation, weight loss EXAM: CT CHEST WITHOUT CONTRAST TECHNIQUE: Multidetector CT imaging of the chest was performed following the standard protocol without IV contrast. COMPARISON:  04/20/2020 FINDINGS: Cardiovascular: Heart is normal in size.  No pericardial effusion.  No evidence of thoracic aortic aneurysm. Atherosclerotic calcifications of the aortic arch. Three vessel coronary atherosclerosis. Mediastinum/Nodes: No suspicious mediastinal lymphadenopathy. Visualized thyroid is normal complete. Lungs/Pleura: Scarring/radiation changes in the left upper lobe/suprahilar region and medial right upper lobe/suprahilar region, unchanged. Wedge-shaped subpleural opacity in the anterior right upper lobe (series 3/image 36), favoring additional scarring, although progressive, now measuring 7 x 14 mm, previously 7 x 11 mm on the most recent prior and 6 x 10 mm in 2020. 14 x 15 mm ill-defined sub solid opacity in the central right middle lobe (series 3/image 83), previously 12 x 14 mm on the most recent prior and 12 x 14 mm in 2020, favored to be mildly progressive with increasing solid component (series 3/image 84). 8 mm irregular/spiculated nodule in the medial right lower lobe (series 3/image 92), previously 4 mm on the most recent prior and essentially new from 2020, suspicious. 4 mm triangular subpleural nodule in the lateral left lower lobe (series 3/image 98), unchanged. 4 mm triangular subpleural nodule in the posterior right lower lobe (series 3/image 87), unchanged. 3 mm subpleural nodule in the posterior left lower lobe (series 3/image 87), unchanged. Moderate centrilobular and paraseptal emphysematous changes, upper lung predominant. No focal consolidation. No pleural effusion or pneumothorax. Upper Abdomen: Visualized upper abdomen is notable for stable low-density thickening of the right adrenal gland, favoring a benign adrenal adenoma, as well as vascular calcifications. Musculoskeletal: Degenerative changes of the visualized thoracolumbar spine. IMPRESSION: Radiation changes in the bilateral upper lobes. Progressive 8 mm nodule in the medial right lower lobe, suspicious. 15 mm ill-defined sub solid opacity in the central right middle lobe is favored to be mildly  progressive. Additional 7 x 14 mm subpleural opacity in the anterior right upper lobe may also be mildly progressive. Continued attention on follow-up is suggested. Aortic Atherosclerosis (ICD10-I70.0) and Emphysema (ICD10-J43.9). Electronically Signed   By: Julian Hy M.D.   On: 10/25/2020 11:30    No results found.  CT CHEST WO CONTRAST  Result Date: 10/25/2020 CLINICAL DATA:  Recurrent lung cancer, status post chemotherapy and radiation, weight loss EXAM: CT CHEST WITHOUT CONTRAST TECHNIQUE: Multidetector CT imaging of the chest was performed following the standard protocol without IV contrast. COMPARISON:  04/20/2020 FINDINGS: Cardiovascular: Heart is normal in size.  No pericardial effusion. No evidence of thoracic aortic aneurysm. Atherosclerotic calcifications of the aortic arch. Three vessel coronary atherosclerosis. Mediastinum/Nodes: No suspicious mediastinal lymphadenopathy. Visualized thyroid is normal complete. Lungs/Pleura: Scarring/radiation changes in the left upper lobe/suprahilar region and medial right upper lobe/suprahilar region, unchanged. Wedge-shaped subpleural opacity in the anterior right upper lobe (series 3/image 36), favoring additional scarring, although progressive, now measuring 7 x 14 mm, previously 7 x 11 mm on the most recent prior and 6 x 10 mm in 2020. 14 x 15 mm ill-defined sub solid opacity in the central right middle lobe (series 3/image 83), previously 12 x 14 mm on the most recent prior and 12 x 14 mm in 2020, favored to be mildly progressive with increasing solid component (series 3/image 84). 8 mm irregular/spiculated nodule in the medial right lower lobe (series 3/image 92), previously  4 mm on the most recent prior and essentially new from 2020, suspicious. 4 mm triangular subpleural nodule in the lateral left lower lobe (series 3/image 98), unchanged. 4 mm triangular subpleural nodule in the posterior right lower lobe (series 3/image 87), unchanged. 3 mm  subpleural nodule in the posterior left lower lobe (series 3/image 87), unchanged. Moderate centrilobular and paraseptal emphysematous changes, upper lung predominant. No focal consolidation. No pleural effusion or pneumothorax. Upper Abdomen: Visualized upper abdomen is notable for stable low-density thickening of the right adrenal gland, favoring a benign adrenal adenoma, as well as vascular calcifications. Musculoskeletal: Degenerative changes of the visualized thoracolumbar spine. IMPRESSION: Radiation changes in the bilateral upper lobes. Progressive 8 mm nodule in the medial right lower lobe, suspicious. 15 mm ill-defined sub solid opacity in the central right middle lobe is favored to be mildly progressive. Additional 7 x 14 mm subpleural opacity in the anterior right upper lobe may also be mildly progressive. Continued attention on follow-up is suggested. Aortic Atherosclerosis (ICD10-I70.0) and Emphysema (ICD10-J43.9). Electronically Signed   By: Julian Hy M.D.   On: 10/25/2020 11:30      Assessment and Plan: Patient Active Problem List   Diagnosis Date Noted  . Normocytic anemia 07/24/2019  . Hyponatremia 05/31/2018  . Pneumonia 05/18/2018  . Pancreatic cyst 07/10/2017  . COPD exacerbation ([place]) 09/09/2016  . GERD (gastroesophageal reflux disease) 09/09/2016  . HTN (hypertension) 09/09/2016  . Cancer of upper lobe of left lung ([place]) 07/06/2016  . Tachycardia 02/03/2013  . Dyspnea 02/03/2013    1. COPD, severe ([place]) Symptoms remain stable on current therapy, requesting refills - SPIRIVA HANDIHALER 18 MCG inhalation capsule; Place 1 capsule (18 mcg total) into inhaler and inhale daily.  Dispense: 30 capsule; Refill: 3 - SYMBICORT 80-4.5 MCG/ACT inhaler; INHALE 2 PUFFS BY MOUTH INTO THE LUNGS 2TIMES DAILY  Dispense: 10.2 g; Refill: 3  2. Chronic respiratory failure, unspecified whether with hypoxia or hypercapnia ([place]) Continue with supplemental oxygen Discussed as symptoms  worsen may consider NIV therapy as well as Daliresp  3. Gastroesophageal reflux disease without esophagitis Symptoms remain stable on therapy, no reports of reflux when lying flat complicating breathing  4. Cancer of upper lobe of left lung (HCC) Stable at this time--will review CT chest for surveillance  5. SOB (shortness of breath) Spirometry stable today compared to last PFT - Spirometry with Graph  General Counseling: I have discussed the findings of the evaluation and examination with Bethany Lambert.  I have also discussed any further diagnostic evaluation thatmay be needed or ordered today. Bethany Lambert verbalizes understanding of the findings of todays visit. We also reviewed her medications today and discussed drug interactions and side effects including but not limited excessive drowsiness and altered mental states. We also discussed that there is always a risk not just to her but also people around her. she has been encouraged to call the office with any questions or concerns that should arise related to todays visit.  Orders Placed This Encounter  Procedures  . Spirometry with Graph    Order Specific Question:   Where should this test be performed?    Answer:   [facility]    Order Specific Question:   Basic spirometry    Answer:   Yes     Time spent: 30  I have personally obtained a history, examined the patient, evaluated laboratory and imaging results, formulated the assessment and plan and placed orders. This patient was seen by Casey Burkitt AGNP-C in Collaboration  with Dr. Devona Konig as a part of collaborative care agreement.    Allyne Gee, MD [facility] Pulmonary and Critical Care Sleep medicine

## 2020-11-10 ENCOUNTER — Encounter: Payer: Self-pay | Admitting: Hospice and Palliative Medicine

## 2020-11-10 NOTE — Patient Instructions (Signed)
Chronic Obstructive Pulmonary Disease Chronic obstructive pulmonary disease (COPD) is a long-term (chronic) lung problem. When you have COPD, it is hard for air to get in and out of your lungs. Usually the condition gets worse over time, and your lungs will never return to normal. There are things you can do to keep yourself as healthy as possible.  Your doctor may treat your condition with: ? Medicines. ? Oxygen. ? Lung surgery.  Your doctor may also recommend: ? Rehabilitation. This includes steps to make your body work better. It may involve a team of specialists. ? Quitting smoking, if you smoke. ? Exercise and changes to your diet. ? Comfort measures (palliative care). Follow these instructions at home: Medicines  Take over-the-counter and prescription medicines only as told by your doctor.  Talk to your doctor before taking any cough or allergy medicines. You may need to avoid medicines that cause your lungs to be dry. Lifestyle  If you smoke, stop. Smoking makes the problem worse. If you need help quitting, ask your doctor.  Avoid being around things that make your breathing worse. This may include smoke, chemicals, and fumes.  Stay active, but remember to rest as well.  Learn and use tips on how to relax.  Make sure you get enough sleep. Most adults need at least 7 hours of sleep every night.  Eat healthy foods. Eat smaller meals more often. Rest before meals. Controlled breathing Learn and use tips on how to control your breathing as told by your doctor. Try:  Breathing in (inhaling) through your nose for 1 second. Then, pucker your lips and breath out (exhale) through your lips for 2 seconds.  Putting one hand on your belly (abdomen). Breathe in slowly through your nose for 1 second. Your hand on your belly should move out. Pucker your lips and breathe out slowly through your lips. Your hand on your belly should move in as you breathe out.  Controlled coughing Learn  and use controlled coughing to clear mucus from your lungs. Follow these steps: 1. Lean your head a little forward. 2. Breathe in deeply. 3. Try to hold your breath for 3 seconds. 4. Keep your mouth slightly open while coughing 2 times. 5. Spit any mucus out into a tissue. 6. Rest and do the steps again 1 or 2 times as needed. General instructions  Make sure you get all the shots (vaccines) that your doctor recommends. Ask your doctor about a flu shot and a pneumonia shot.  Use oxygen therapy and pulmonary rehabilitation if told by your doctor. If you need home oxygen therapy, ask your doctor if you should buy a tool to measure your oxygen level (oximeter).  Make a COPD action plan with your doctor. This helps you to know what to do if you feel worse than usual.  Manage any other conditions you have as told by your doctor.  Avoid going outside when it is very hot, cold, or humid.  Avoid people who have a sickness you can catch (contagious).  Keep all follow-up visits as told by your doctor. This is important. Contact a doctor if:  You cough up more mucus than usual.  There is a change in the color or thickness of the mucus.  It is harder to breathe than usual.  Your breathing is faster than usual.  You have trouble sleeping.  You need to use your medicines more often than usual.  You have trouble doing your normal activities such as getting dressed   or walking around the house. Get help right away if:  You have shortness of breath while resting.  You have shortness of breath that stops you from: ? Being able to talk. ? Doing normal activities.  Your chest hurts for longer than 5 minutes.  Your skin color is more blue than usual.  Your pulse oximeter shows that you have low oxygen for longer than 5 minutes.  You have a fever.  You feel too tired to breathe normally. Summary  Chronic obstructive pulmonary disease (COPD) is a long-term lung problem.  The way your  lungs work will never return to normal. Usually the condition gets worse over time. There are things you can do to keep yourself as healthy as possible.  Take over-the-counter and prescription medicines only as told by your doctor.  If you smoke, stop. Smoking makes the problem worse. This information is not intended to replace advice given to you by your health care provider. Make sure you discuss any questions you have with your health care provider. Document Revised: 11/03/2017 Document Reviewed: 12/26/2016 Elsevier Patient Education  2020 [employer]  

## 2021-04-20 ENCOUNTER — Other Ambulatory Visit: Payer: Self-pay | Admitting: *Deleted

## 2021-04-20 DIAGNOSIS — C3412 Malignant neoplasm of upper lobe, left bronchus or lung: Secondary | ICD-10-CM

## 2021-04-26 ENCOUNTER — Other Ambulatory Visit: Payer: Self-pay

## 2021-04-26 ENCOUNTER — Ambulatory Visit
Admission: RE | Admit: 2021-04-26 | Discharge: 2021-04-26 | Disposition: A | Discharge status: Home / Self Care | Payer: Medicare Other | Source: Ambulatory Visit | Attending: Internal Medicine | Admitting: Internal Medicine

## 2021-04-26 DIAGNOSIS — C3412 Malignant neoplasm of upper lobe, left bronchus or lung: Secondary | ICD-10-CM | POA: Diagnosis present

## 2021-04-26 LAB — POCT I-STAT CREATININE: Creatinine, Ser: 1 mg/dL (ref 0.44–1.00)

## 2021-04-26 MED ORDER — IOHEXOL 300 MG/ML  SOLN
75.0000 mL | Freq: Once | INTRAMUSCULAR | Status: AC | PRN
  Administered 2021-04-26: 75 mL via INTRAVENOUS

## 2021-04-28 ENCOUNTER — Other Ambulatory Visit: Payer: Self-pay

## 2021-04-28 ENCOUNTER — Inpatient Hospital Stay: Payer: Medicare Other | Admitting: Internal Medicine

## 2021-04-28 ENCOUNTER — Inpatient Hospital Stay: Payer: Medicare Other | Attending: Internal Medicine

## 2021-04-28 VITALS — BP 143/60 | HR 66 | Temp 98.4°F | Resp 18 | Wt 109.2 lb

## 2021-04-28 DIAGNOSIS — C3412 Malignant neoplasm of upper lobe, left bronchus or lung: Secondary | ICD-10-CM

## 2021-04-28 DIAGNOSIS — F419 Anxiety disorder, unspecified: Secondary | ICD-10-CM | POA: Insufficient documentation

## 2021-04-28 DIAGNOSIS — Z809 Family history of malignant neoplasm, unspecified: Secondary | ICD-10-CM | POA: Insufficient documentation

## 2021-04-28 DIAGNOSIS — R42 Dizziness and giddiness: Secondary | ICD-10-CM | POA: Insufficient documentation

## 2021-04-28 DIAGNOSIS — J449 Chronic obstructive pulmonary disease, unspecified: Secondary | ICD-10-CM | POA: Insufficient documentation

## 2021-04-28 DIAGNOSIS — Z9981 Dependence on supplemental oxygen: Secondary | ICD-10-CM | POA: Insufficient documentation

## 2021-04-28 DIAGNOSIS — Z9221 Personal history of antineoplastic chemotherapy: Secondary | ICD-10-CM | POA: Diagnosis not present

## 2021-04-28 DIAGNOSIS — K863 Pseudocyst of pancreas: Secondary | ICD-10-CM | POA: Diagnosis not present

## 2021-04-28 DIAGNOSIS — Z923 Personal history of irradiation: Secondary | ICD-10-CM | POA: Insufficient documentation

## 2021-04-28 DIAGNOSIS — Z800 Family history of malignant neoplasm of digestive organs: Secondary | ICD-10-CM | POA: Diagnosis not present

## 2021-04-28 DIAGNOSIS — D649 Anemia, unspecified: Secondary | ICD-10-CM | POA: Diagnosis not present

## 2021-04-28 DIAGNOSIS — R918 Other nonspecific abnormal finding of lung field: Secondary | ICD-10-CM | POA: Diagnosis not present

## 2021-04-28 DIAGNOSIS — Z87891 Personal history of nicotine dependence: Secondary | ICD-10-CM | POA: Insufficient documentation

## 2021-04-28 LAB — CBC WITH DIFFERENTIAL/PLATELET
Abs Immature Granulocytes: 0.02 10*3/uL (ref 0.00–0.07)
Basophils Absolute: 0 10*3/uL (ref 0.0–0.1)
Basophils Relative: 1 %
Eosinophils Absolute: 0.1 10*3/uL (ref 0.0–0.5)
Eosinophils Relative: 2 %
HCT: 27.4 % — ABNORMAL LOW (ref 36.0–46.0)
Hemoglobin: 9.2 g/dL — ABNORMAL LOW (ref 12.0–15.0)
Immature Granulocytes: 0 %
Lymphocytes Relative: 25 %
Lymphs Abs: 1.4 10*3/uL (ref 0.7–4.0)
MCH: 31.8 pg (ref 26.0–34.0)
MCHC: 33.6 g/dL (ref 30.0–36.0)
MCV: 94.8 fL (ref 80.0–100.0)
Monocytes Absolute: 0.5 10*3/uL (ref 0.1–1.0)
Monocytes Relative: 9 %
Neutro Abs: 3.5 10*3/uL (ref 1.7–7.7)
Neutrophils Relative %: 63 %
Platelets: 171 10*3/uL (ref 150–400)
RBC: 2.89 MIL/uL — ABNORMAL LOW (ref 3.87–5.11)
RDW: 13.1 % (ref 11.5–15.5)
WBC: 5.6 10*3/uL (ref 4.0–10.5)
nRBC: 0 % (ref 0.0–0.2)

## 2021-04-28 LAB — COMPREHENSIVE METABOLIC PANEL
ALT: 16 U/L (ref 0–44)
AST: 15 U/L (ref 15–41)
Albumin: 3.3 g/dL — ABNORMAL LOW (ref 3.5–5.0)
Alkaline Phosphatase: 78 U/L (ref 38–126)
Anion gap: 9 (ref 5–15)
BUN: 14 mg/dL (ref 8–23)
CO2: 28 mmol/L (ref 22–32)
Calcium: 8.7 mg/dL — ABNORMAL LOW (ref 8.9–10.3)
Chloride: 95 mmol/L — ABNORMAL LOW (ref 98–111)
Creatinine, Ser: 0.94 mg/dL (ref 0.44–1.00)
GFR, Estimated: 59 mL/min — ABNORMAL LOW (ref >60)
Glucose, Bld: 100 mg/dL — ABNORMAL HIGH (ref 70–99)
Potassium: 4.8 mmol/L (ref 3.5–5.1)
Sodium: 132 mmol/L — ABNORMAL LOW (ref 135–145)
Total Bilirubin: 0.5 mg/dL (ref 0.3–1.2)
Total Protein: 6.2 g/dL — ABNORMAL LOW (ref 6.5–8.1)

## 2021-04-28 LAB — IRON AND TIBC
Iron: 52 ug/dL (ref 28–170)
Saturation Ratios: 17 % (ref 10.4–31.8)
TIBC: 305 ug/dL (ref 250–450)
UIBC: 253 ug/dL

## 2021-04-28 LAB — FERRITIN: Ferritin: 101 ng/mL (ref 11–307)

## 2021-04-28 NOTE — Progress Notes (Signed)
[place] OFFICE PROGRESS NOTE  Patient Care Team: Baxter Hire, [name]  Cancer Staging No matching staging information was found for the patient.   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  # COPD Home O2 2.5L -  # 2019-2020-mild anemia- Hb-10; no IDA; colo-"many years ago".   # DIAGNOSIS: Lung cancer- LUL stage III/ RUL stage I  GOALS: cuartive  CURRENT/MOST RECENT THERAPY; surveillaince    Cancer of upper lobe of left lung (HCC)    INTERVAL HISTORY:  Bethany Lambert 87 y.o.  female pleasant patient above history of lung cancer/COPD pancreatic cysts is here for follow-up/review results of CT chest.  Patient admits to chronic shortness of breath chronic cough.  Chronic fatigue.  Denies any worse abdominal pain.  No nausea no vomiting.    Denies any headache.  Denies any falls.  Patient continues to be on chronic oxygen.  Review of Systems  Constitutional: Positive for malaise/fatigue. Negative for chills, diaphoresis, fever and weight loss.  HENT: Negative for nosebleeds and sore throat.   Eyes: Negative for double vision.  Respiratory: Positive for cough and shortness of breath. Negative for hemoptysis and sputum production.   Cardiovascular: Negative for chest pain, palpitations, orthopnea and leg swelling.  Gastrointestinal: Negative for abdominal pain, blood in stool, constipation, diarrhea, heartburn, melena, nausea and vomiting.  Genitourinary: Negative for dysuria, frequency and urgency.  Musculoskeletal: Positive for back pain and joint  pain.  Skin: Negative.  Negative for itching and rash.  Neurological: Negative for dizziness, tingling, focal weakness, weakness and headaches.  Endo/Heme/Allergies: Does not bruise/bleed easily.  Psychiatric/Behavioral: Positive for memory loss. Negative for depression. The patient is not nervous/anxious and does not have insomnia.     PAST MEDICAL HISTORY :  Past Medical History:  Diagnosis Date  . 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 COPD (chronic obstructive pulmonary disease) (HCC)    on 2L home o2  . Dizziness   . Erythrocytosis   . GERD (gastroesophageal reflux disease)   . History of chemotherapy   . History of radiation therapy   . Hyperlipidemia   . Hypertension   . Kidney failure   . Polycythemia   . Psoriasis     PAST SURGICAL HISTORY :   Past Surgical History:  Procedure Laterality Date  . CATARACT EXTRACTION    . COLON RESECTION    . CT GUIDED BIOPSY  ([facility] HX)  11/19/2012   lung  . HERNIA REPAIR    . TOTAL ABDOMINAL HYSTERECTOMY      FAMILY HISTORY :   Family History  Problem Relation Age of Onset  . Heart attack Father   . Colon cancer Father   . Stroke Mother   . Cancer Son     SOCIAL HISTORY:   Social History   Tobacco Use  . Smoking status: Former Smoker    Packs/day: 2.00    Years: 40.00    Pack years: 80.00    Types: Cigarettes  Quit date: 06/22/2001    Years since quitting: 19.8  . Smokeless tobacco: Never Used  Vaping Use  . Vaping Use: Never used  Substance Use Topics  . Alcohol use: No    Alcohol/week: 0.0 standard drinks  . Drug use: No    ALLERGIES:  is allergic to ciprofloxacin and norvasc [amlodipine besylate].  MEDICATIONS:  Current Outpatient Medications  Medication Sig Dispense Refill  . ALPRAZolam (XANAX) 0.25 MG tablet Take 0.25 mg by mouth at bedtime as needed for anxiety.    . ALPRAZolam (XANAX) 0.25 MG tablet TAKE 1 TABLET BY MOUTH 2 TIMES DAILY AS NEEDED FOR  ANXIETY FOR UP TO 30 DAYS    . busPIRone (BUSPAR) 5 MG tablet Take by mouth.    . cyanocobalamin 1000 MCG tablet Take 1,000 mcg by mouth daily.    Marland Kitchen diltiazem (CARDIZEM CD) 120 MG 24 hr capsule   2  . docusate sodium (COLACE) 100 MG capsule Take 100-200 mg by mouth 2 (two) times daily. 100 mg in the morning and 200 mg at bedtime    . ipratropium-albuterol (DUONEB) 0.5-2.5 (3) MG/3ML SOLN Take 3 mLs by nebulization every 6 (six) hours as needed (wheezing).     Marland Kitchen losartan (COZAAR) 50 MG tablet Take by mouth.    . metoprolol (LOPRESSOR) 50 MG tablet Take 1 tablet (50 mg total) by mouth 2 (two) times daily.    Marland Kitchen omeprazole (PRILOSEC) 20 MG capsule Take by mouth.    . OXYGEN Inhale 3 L into the lungs continuous.     . potassium chloride (K-DUR) 10 MEQ tablet 10 mEq daily.   11  . PROAIR HFA 108 (90 Base) MCG/ACT inhaler INHALE 2 PUFFS INTO THE LUNGS EVERY 6 (SIX) HOURS AS NEEDED FOR WHEEZING. 18 g 2  . SPIRIVA HANDIHALER 18 MCG inhalation capsule Place 1 capsule (18 mcg total) into inhaler and inhale daily. 30 capsule 3  . SYMBICORT 80-4.5 MCG/ACT inhaler INHALE 2 PUFFS BY MOUTH INTO THE LUNGS 2TIMES DAILY 10.2 g 3  . traZODone (DESYREL) 100 MG tablet Take 100 mg by mouth at bedtime as needed for sleep.     No current facility-administered medications for this visit.    PHYSICAL EXAMINATION: ECOG PERFORMANCE STATUS: 1 - Symptomatic but completely ambulatory  BP (!) 143/60   Pulse 66   Temp 98.4 F (36.9 C) (Tympanic)   Resp 18   Wt 109 lb 4 oz (49.6 kg)   SpO2 99%   BMI 22.83 kg/m   Filed Weights   04/28/21 1417  Weight: 109 lb 4 oz (49.6 kg)    Physical Exam Constitutional:      Comments: Thin built Caucasian female patient.  Accompanied by daughter.  She is in a wheelchair.  2.5 L of oxygen.  HENT:     Head: Normocephalic and atraumatic.     Mouth/Throat:     Pharynx: No oropharyngeal exudate.  Eyes:     Pupils: Pupils are equal, round, and reactive to light.   Cardiovascular:     Rate and Rhythm: Normal rate and regular rhythm.  Pulmonary:     Effort: No respiratory distress.     Breath sounds: No wheezing.  Abdominal:     General: Bowel sounds are normal. There is no distension.     Palpations: Abdomen is soft. There is no mass.     Tenderness: There is no abdominal tenderness. There is no guarding or rebound.  Musculoskeletal:        General:  No tenderness. Normal range of motion.     Cervical back: Normal range of motion and neck supple.  Skin:    General: Skin is warm.  Neurological:     Mental Status: She is alert and oriented to person, place, and time.  Psychiatric:        Mood and Affect: Affect normal.        LABORATORY DATA:  I have reviewed the data as listed    Component Value Date/Time   NA 132 (L) 04/28/2021 1342   NA 136 12/29/2014 0827   K 4.8 04/28/2021 1342   K 4.0 12/29/2014 0827   CL 95 (L) 04/28/2021 1342   CL 102 12/29/2014 0827   CO2 28 04/28/2021 1342   CO2 28 12/29/2014 0827   GLUCOSE 100 (H) 04/28/2021 1342   GLUCOSE 115 (H) 12/29/2014 0827   BUN 14 04/28/2021 1342   BUN 15 12/29/2014 0827   CREATININE 0.94 04/28/2021 1342   CREATININE 1.23 12/29/2014 0827   CALCIUM 8.7 (L) 04/28/2021 1342   CALCIUM 8.8 12/29/2014 0827   PROT 6.2 (L) 04/28/2021 1342   PROT 6.3 (L) 12/01/2014 1355   ALBUMIN 3.3 (L) 04/28/2021 1342   ALBUMIN 3.2 (L) 12/01/2014 1355   AST 15 04/28/2021 1342   AST 14 (L) 12/01/2014 1355   ALT 16 04/28/2021 1342   ALT 22 12/01/2014 1355   ALKPHOS 78 04/28/2021 1342   ALKPHOS 100 12/01/2014 1355   BILITOT 0.5 04/28/2021 1342   BILITOT 0.3 12/01/2014 1355   GFRNONAA 59 (L) 04/28/2021 1342   GFRNONAA 45 (L) 12/29/2014 0827   GFRNONAA 42 (L) 08/13/2014 1020   GFRAA >60 04/22/2020 1442   GFRAA 54 (L) 12/29/2014 0827   GFRAA 49 (L) 08/13/2014 1020    No results found for: SPEP, UPEP  Lab Results  Component Value Date   WBC 5.6 04/28/2021   NEUTROABS 3.5 04/28/2021   HGB  9.2 (L) 04/28/2021   HCT 27.4 (L) 04/28/2021   MCV 94.8 04/28/2021   PLT 171 04/28/2021      Chemistry      Component Value Date/Time   NA 132 (L) 04/28/2021 1342   NA 136 12/29/2014 0827   K 4.8 04/28/2021 1342   K 4.0 12/29/2014 0827   CL 95 (L) 04/28/2021 1342   CL 102 12/29/2014 0827   CO2 28 04/28/2021 1342   CO2 28 12/29/2014 0827   BUN 14 04/28/2021 1342   BUN 15 12/29/2014 0827   CREATININE 0.94 04/28/2021 1342   CREATININE 1.23 12/29/2014 0827      Component Value Date/Time   CALCIUM 8.7 (L) 04/28/2021 1342   CALCIUM 8.8 12/29/2014 0827   ALKPHOS 78 04/28/2021 1342   ALKPHOS 100 12/01/2014 1355   AST 15 04/28/2021 1342   AST 14 (L) 12/01/2014 1355   ALT 16 04/28/2021 1342   ALT 22 12/01/2014 1355   BILITOT 0.5 04/28/2021 1342   BILITOT 0.3 12/01/2014 1355       RADIOGRAPHIC STUDIES: I have personally reviewed the radiological images as listed and agreed with the findings in the report. No results found.   ASSESSMENT & PLAN:  Cancer of upper lobe of left lung ([place]) # Right upper lobe lung nodule [11 x 8 mm] s/p SBRT in end of July 2017.  CT scan- MAY 2022- # a] medial RLL- Progressive 1.5 x 1.4cm [ Nov 2021-8 mm nodule; b] central RML15 mm ill-defined sub solid opacity- STABLE c] Anterior subpleural RUL 7  x 14 mm subpleural- STABLE.   #I reviewed the above progressive findings noted on the CT scan; recommend further evaluation of the PET scan ASAP.  Also recommend evaluation with radiation oncology.  Patient a poor candidate for biopsy; or any other surgery.  We will also discuss at tumor conference. 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 CT MAY 2022-stable no new changes.  # Pancreatic cyst-February 2021 MRI abdomen shows multiple cysts in the pancreas-pseudocyst-not concerning for malignancy. STABLE; repeat in spring 2023.   #Chronic anemia-normocytic; hemoglobin 9-10; no evidence  of obvious iron deficiency stable.  # Dizziness- chronic [ENT/[facility] neurology]-recommend involving neurology-STABLE    #COPD chronicon [facility]- STABLE/continue follow-up with Dr.Khan.  # Anxiety/sleep- recommend Buspar/continue melatonin 10 mg nightly.  STABLE.   # DISPOSITION:  # PET ASAP- # Referral to Dr.Chrystal after the PET scan # follow up TBD- Dr.B  # I reviewed the blood work- with the patient in detail; also reviewed the imaging independently [as summarized above]; and with the patient in detail.    Cc; Hayley.         Orders Placed This Encounter  Procedures  . NM PET Image Restag (PS) Skull Base To Thigh    Standing Status:   Future    Standing Expiration Date:   04/28/2022    Order Specific Question:   If indicated for the ordered procedure, I authorize the administration of a radiopharmaceutical per Radiology protocol    Answer:   Yes    Order Specific Question:   Preferred imaging location?    Answer:   [facility]   All questions were answered. The patient knows to call the clinic with any problems, questions or concerns.      Cammie Sickle, MD 04/28/2021 8:07 PM

## 2021-04-28 NOTE — Assessment & Plan Note (Addendum)
#   Right upper lobe lung nodule [11 x 8 mm] s/p SBRT in end of July 2017.  CT scan- MAY 2022- # a] medial RLL- Progressive 1.5 x 1.4cm [ Nov 2021-8 mm nodule; b] central RML15 mm ill-defined sub solid opacity- STABLE c] Anterior subpleural RUL 7 x 14 mm subpleural- STABLE.   #I reviewed the above progressive findings noted on the CT scan; recommend further evaluation of the PET scan ASAP.  Also recommend evaluation with radiation oncology.  Patient a poor candidate for biopsy; or any other surgery.  We will also discuss at tumor conference. 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 CT MAY 2022-stable no new changes.  # Pancreatic cyst-February 2021 MRI abdomen shows multiple cysts in the pancreas-pseudocyst-not concerning for malignancy. STABLE; repeat in spring 2023.   #Chronic anemia-normocytic; hemoglobin 9-10; no evidence of obvious iron deficiency stable.  # Dizziness- chronic [ENT/[facility] neurology]-recommend involving neurology-STABLE    #COPD chronicon [facility]- STABLE/continue follow-up with Dr.Khan.  # Anxiety/sleep- recommend Buspar/continue melatonin 10 mg nightly.  STABLE.   # DISPOSITION:  # PET ASAP- # Referral to Dr.Chrystal after the PET scan # follow up TBD- Dr.B  # I reviewed the blood work- with the patient in detail; also reviewed the imaging independently [as summarized above]; and with the patient in detail.    Cc; Hayley.

## 2021-05-05 ENCOUNTER — Ambulatory Visit: Payer: Medicare Other | Admitting: Internal Medicine

## 2021-05-05 ENCOUNTER — Other Ambulatory Visit: Payer: Self-pay

## 2021-05-05 DIAGNOSIS — R0602 Shortness of breath: Secondary | ICD-10-CM | POA: Diagnosis not present

## 2021-05-06 ENCOUNTER — Other Ambulatory Visit: Payer: Medicare Other

## 2021-05-07 NOTE — Progress Notes (Signed)
Tumor Board Documentation  Bethany Lambert was presented by Dr Rogue Bussing at our Tumor Board on 05/06/2021, which included representatives from medical oncology,radiation oncology,navigation,pathology,radiology,surgical,pharmacy,genetics,research,palliative care.  Bethany Lambert currently presents as a current patient,for discussion with history of the following treatments: active survellience,neoadjuvant chemotherapy,neoadjuvant radiation.  Additionally, we reviewed previous medical and familial history, history of present illness, and recent lab results along with all available histopathologic and imaging studies. The tumor board considered available treatment options and made the following recommendations: Additional screening (PET) Possible SBRT depending on PET results  The following procedures/referrals were also placed: No orders of the defined types were placed in this encounter.   Clinical Trial Status: not discussed   Staging used: To be determined  AJCC Staging:       Group: Lung Cancer   National site-specific guidelines   were discussed with respect to the case.  Tumor board is a meeting of clinicians from various specialty areas who evaluate and discuss patients for whom a multidisciplinary approach is being considered. Final determinations in the plan of care are those of the provider(s). The responsibility for follow up of recommendations given during tumor board is that of the provider.   Today's extended care, comprehensive team conference, Cristela was not present for the discussion and was not examined.   Multidisciplinary Tumor Board is a multidisciplinary case peer review process.  Decisions discussed in the Multidisciplinary Tumor Board reflect the opinions of the specialists present at the conference without having examined the patient.  Ultimately, treatment and diagnostic decisions rest with the primary provider(s) and the patient.

## 2021-05-09 NOTE — Procedures (Signed)
[facility] [place], [address]    Complete Pulmonary Function Testing Interpretation:  FINDINGS:  Forced vital capacity is severely decreased FEV1 is 0.52 L which is 35% predicted and is severely decreased.  Postbronchodilator no significant improvement in FEV1.  FEV1 FVC ratio is mildly decreased.  Total lung capacity is normal residual volume is increased residual internal capacity ratio is increased FRC is increased  IMPRESSION:  This pulmonary function study is suggestive of severe obstructive lung disease without significant bronchodilator response  Allyne Gee, MD [facility] Pulmonary Critical Care Medicine Sleep Medicine

## 2021-05-10 ENCOUNTER — Ambulatory Visit: Payer: Medicare Other | Admitting: Hospice and Palliative Medicine

## 2021-05-14 ENCOUNTER — Other Ambulatory Visit: Payer: Self-pay | Admitting: *Deleted

## 2021-05-14 DIAGNOSIS — C61 Malignant neoplasm of prostate: Secondary | ICD-10-CM

## 2021-05-17 ENCOUNTER — Other Ambulatory Visit: Payer: Self-pay

## 2021-05-17 ENCOUNTER — Ambulatory Visit
Admission: RE | Admit: 2021-05-17 | Discharge: 2021-05-17 | Disposition: A | Discharge status: Home / Self Care | Payer: Medicare Other | Source: Ambulatory Visit | Attending: Internal Medicine | Admitting: Internal Medicine

## 2021-05-17 DIAGNOSIS — I700 Atherosclerosis of aorta: Secondary | ICD-10-CM | POA: Diagnosis not present

## 2021-05-17 DIAGNOSIS — N200 Calculus of kidney: Secondary | ICD-10-CM | POA: Insufficient documentation

## 2021-05-17 DIAGNOSIS — I2510 Atherosclerotic heart disease of native coronary artery without angina pectoris: Secondary | ICD-10-CM | POA: Diagnosis not present

## 2021-05-17 DIAGNOSIS — J449 Chronic obstructive pulmonary disease, unspecified: Secondary | ICD-10-CM

## 2021-05-17 DIAGNOSIS — J439 Emphysema, unspecified: Secondary | ICD-10-CM | POA: Diagnosis not present

## 2021-05-17 DIAGNOSIS — R918 Other nonspecific abnormal finding of lung field: Secondary | ICD-10-CM | POA: Diagnosis present

## 2021-05-17 DIAGNOSIS — C3412 Malignant neoplasm of upper lobe, left bronchus or lung: Secondary | ICD-10-CM | POA: Insufficient documentation

## 2021-05-17 LAB — GLUCOSE, CAPILLARY: Glucose-Capillary: 97 mg/dL (ref 70–99)

## 2021-05-17 MED ORDER — SPIRIVA HANDIHALER 18 MCG IN CAPS: 1.0000 | ORAL_CAPSULE | Freq: Every day | RESPIRATORY_TRACT | 3 refills | Status: DC

## 2021-05-17 MED ORDER — FLUDEOXYGLUCOSE F - 18 (FDG) INJECTION: 5.6000 | Freq: Once | INTRAVENOUS | Status: DC | PRN

## 2021-05-17 MED ORDER — FLUDEOXYGLUCOSE F - 18 (FDG) INJECTION
5.6000 | Freq: Once | INTRAVENOUS | Status: AC | PRN
  Administered 2021-05-17: 5.7 via INTRAVENOUS

## 2021-05-18 ENCOUNTER — Ambulatory Visit: Payer: Medicare Other | Admitting: Radiation Oncology

## 2021-05-18 ENCOUNTER — Ambulatory Visit: Payer: Medicare Other | Admitting: Internal Medicine

## 2021-05-19 ENCOUNTER — Other Ambulatory Visit: Payer: Self-pay

## 2021-05-19 ENCOUNTER — Encounter: Payer: Self-pay | Admitting: Radiation Oncology

## 2021-05-19 ENCOUNTER — Ambulatory Visit
Admission: RE | Admit: 2021-05-19 | Discharge: 2021-05-19 | Disposition: A | Discharge status: Home / Self Care | Payer: Medicare Other | Source: Ambulatory Visit | Attending: Radiation Oncology | Admitting: Radiation Oncology

## 2021-05-19 ENCOUNTER — Other Ambulatory Visit: Payer: Self-pay | Admitting: *Deleted

## 2021-05-19 VITALS — BP 157/56 | HR 62 | Temp 98.1°F | Resp 16 | Wt 109.0 lb

## 2021-05-19 DIAGNOSIS — R4189 Other symptoms and signs involving cognitive functions and awareness: Secondary | ICD-10-CM | POA: Diagnosis not present

## 2021-05-19 DIAGNOSIS — Z993 Dependence on wheelchair: Secondary | ICD-10-CM | POA: Insufficient documentation

## 2021-05-19 DIAGNOSIS — R54 Age-related physical debility: Secondary | ICD-10-CM | POA: Insufficient documentation

## 2021-05-19 DIAGNOSIS — Z85118 Personal history of other malignant neoplasm of bronchus and lung: Secondary | ICD-10-CM | POA: Insufficient documentation

## 2021-05-19 DIAGNOSIS — Z923 Personal history of irradiation: Secondary | ICD-10-CM | POA: Diagnosis not present

## 2021-05-19 DIAGNOSIS — Z9221 Personal history of antineoplastic chemotherapy: Secondary | ICD-10-CM | POA: Diagnosis not present

## 2021-05-19 DIAGNOSIS — C61 Malignant neoplasm of prostate: Secondary | ICD-10-CM

## 2021-05-19 DIAGNOSIS — R918 Other nonspecific abnormal finding of lung field: Secondary | ICD-10-CM | POA: Insufficient documentation

## 2021-05-19 NOTE — Progress Notes (Signed)
Radiation Oncology Follow up Note OP N/A old patient new area new lung lesion  Name: Bethany Lambert   Date:   05/19/2021 MRN:  [id number] DOB: [birth year]    This 87 y.o. female presents to the clinic today for evaluation of new progressive hypermetabolic lesion in the right lung and patient status post SBRT to a right upper lobe now out 5 years.  Now with new right lower lobe lung nodule hypermetabolic on PET CT scan  REFERRING PROVIDER: Baxter Hire, MD  HPI: Patient is a 87 year old female originally treated 5 years ago with SBRT in 2018.  Progressive follow-up CT scans have shown a new lesion in the right lower lobe increasing in size and on recent PET CT scan shown to be hypermetabolic.  She also had a 6 mm hypermetabolic subcarinal node of unknown significance.  She was presented at weekly tumor conference and recommendation was made for SBRT.  She is seen today she is frail wheelchair-bound and has some cognitive decline.  Patient is a poor candidate for biopsy or any surgical intervention and especially chemotherapy.  Patient also had concurrent chemoradiation therapy back in 2014 for stage IIIa squamous cell carcinoma of the left lung.  COMPLICATIONS OF TREATMENT: none  FOLLOW UP COMPLIANCE: keeps appointments   PHYSICAL EXAM:  BP (!) 157/56   Pulse 62   Temp 98.1 F (36.7 C) (Tympanic)   Resp 16   Wt 109 lb (49.4 kg)   BMI 22.78 kg/m  Wheelchair-bound female in NAD.  Well-developed well-nourished patient in NAD. HEENT reveals PERLA, EOMI, discs not visualized.  Oral cavity is clear. No oral mucosal lesions are identified. Neck is clear without evidence of cervical or supraclavicular adenopathy. Lungs are clear to A&P. Cardiac examination is essentially unremarkable with regular rate and rhythm without murmur rub or thrill. Abdomen is benign with no organomegaly or masses noted. Motor sensory and DTR levels are equal and symmetric in the upper and lower extremities. Cranial  nerves II through XII are grossly intact. Proprioception is intact. No peripheral adenopathy or edema is identified. No motor or sensory levels are noted. Crude visual fields are within normal range.  RADIOLOGY RESULTS: CT scans and PET CT scans reviewed compatible with above-stated findings  PLAN: At this time I would recommend SBRT 60 Gray in 5 fractions to the right lower lobe lesion.  I will not be addressing the subcarinal nodule since it is so small and would not be possible to treat with SBRT since it such a midline structure and close proximity to the esophagus.  We will continue to observe that area should there be progression in that lesion would opt to treat that further down the road as a separate entity.  Patient and daughter both seem to comprehend my treatment plan well.  We will use 4-dimensional treatment planning and motion restriction during our CT simulation.  I have personally set up and ordered CT simulation in about a week's time.  I would like to take this opportunity to thank you for allowing me to participate in the care of your patient.Noreene Filbert, MD

## 2021-05-21 ENCOUNTER — Telehealth: Payer: Self-pay | Admitting: Internal Medicine

## 2021-05-21 DIAGNOSIS — C3412 Malignant neoplasm of upper lobe, left bronchus or lung: Secondary | ICD-10-CM

## 2021-05-21 NOTE — Telephone Encounter (Signed)
On 6/16-left voicemail for the patient the results of the PET scan.  Patient s/p evaluation with Dr. Donella Stade; agree with SBRT.  Hayley-please inform patient of my above recommendation/in agreement with Dr. Donette Larry plan.  Schedule follow-up 3 months- MD; labs- cbc/cmp. Thanks GB

## 2021-05-21 NOTE — Telephone Encounter (Signed)
Bethany Lambert - please schedule patient for 3 months and labs.

## 2021-05-21 NOTE — Addendum Note (Signed)
Addended by: Gloris Ham on: 05/21/2021 08:25 AM   Modules accepted: Orders

## 2021-05-24 ENCOUNTER — Ambulatory Visit: Payer: Medicare Other

## 2021-06-01 ENCOUNTER — Ambulatory Visit
Admission: RE | Admit: 2021-06-01 | Discharge: 2021-06-01 | Disposition: A | Discharge status: Home / Self Care | Payer: Medicare Other | Source: Ambulatory Visit | Attending: Radiation Oncology | Admitting: Radiation Oncology

## 2021-06-01 DIAGNOSIS — Z87891 Personal history of nicotine dependence: Secondary | ICD-10-CM | POA: Diagnosis present

## 2021-06-01 DIAGNOSIS — C3431 Malignant neoplasm of lower lobe, right bronchus or lung: Secondary | ICD-10-CM | POA: Diagnosis present

## 2021-06-03 ENCOUNTER — Ambulatory Visit: Payer: Medicare Other | Admitting: Internal Medicine

## 2021-06-04 LAB — PULMONARY FUNCTION TEST

## 2021-06-08 DIAGNOSIS — Z87891 Personal history of nicotine dependence: Secondary | ICD-10-CM | POA: Diagnosis present

## 2021-06-08 DIAGNOSIS — C3431 Malignant neoplasm of lower lobe, right bronchus or lung: Secondary | ICD-10-CM | POA: Diagnosis present

## 2021-06-14 ENCOUNTER — Ambulatory Visit: Payer: Medicare Other

## 2021-06-16 ENCOUNTER — Ambulatory Visit: Payer: Medicare Other

## 2021-06-21 ENCOUNTER — Ambulatory Visit
Admission: RE | Admit: 2021-06-21 | Discharge: 2021-06-21 | Disposition: A | Discharge status: Home / Self Care | Payer: Medicare Other | Source: Ambulatory Visit | Attending: Radiation Oncology | Admitting: Radiation Oncology

## 2021-06-21 DIAGNOSIS — C3431 Malignant neoplasm of lower lobe, right bronchus or lung: Secondary | ICD-10-CM | POA: Diagnosis not present

## 2021-06-23 ENCOUNTER — Ambulatory Visit
Admission: RE | Admit: 2021-06-23 | Discharge: 2021-06-23 | Disposition: A | Discharge status: Home / Self Care | Payer: Medicare Other | Source: Ambulatory Visit | Attending: Radiation Oncology | Admitting: Radiation Oncology

## 2021-06-23 DIAGNOSIS — C3431 Malignant neoplasm of lower lobe, right bronchus or lung: Secondary | ICD-10-CM | POA: Diagnosis not present

## 2021-06-28 ENCOUNTER — Ambulatory Visit
Admission: RE | Admit: 2021-06-28 | Discharge: 2021-06-28 | Disposition: A | Discharge status: Home / Self Care | Payer: Medicare Other | Source: Ambulatory Visit | Attending: Radiation Oncology | Admitting: Radiation Oncology

## 2021-06-28 ENCOUNTER — Ambulatory Visit: Payer: Medicare Other

## 2021-06-28 DIAGNOSIS — C3431 Malignant neoplasm of lower lobe, right bronchus or lung: Secondary | ICD-10-CM | POA: Diagnosis not present

## 2021-06-30 ENCOUNTER — Ambulatory Visit
Admission: RE | Admit: 2021-06-30 | Discharge: 2021-06-30 | Disposition: A | Discharge status: Home / Self Care | Payer: Medicare Other | Source: Ambulatory Visit | Attending: Radiation Oncology | Admitting: Radiation Oncology

## 2021-06-30 DIAGNOSIS — C3431 Malignant neoplasm of lower lobe, right bronchus or lung: Secondary | ICD-10-CM | POA: Diagnosis not present

## 2021-07-05 ENCOUNTER — Ambulatory Visit: Payer: Medicare Other

## 2021-07-06 ENCOUNTER — Ambulatory Visit: Payer: Medicare Other

## 2021-07-08 ENCOUNTER — Ambulatory Visit
Admission: RE | Admit: 2021-07-08 | Discharge: 2021-07-08 | Disposition: A | Discharge status: Home / Self Care | Payer: Medicare Other | Source: Ambulatory Visit | Attending: Radiation Oncology | Admitting: Radiation Oncology

## 2021-07-08 DIAGNOSIS — Z510 Encounter for antineoplastic radiation therapy: Secondary | ICD-10-CM | POA: Diagnosis not present

## 2021-07-08 DIAGNOSIS — C7801 Secondary malignant neoplasm of right lung: Secondary | ICD-10-CM | POA: Insufficient documentation

## 2021-07-08 DIAGNOSIS — Z87891 Personal history of nicotine dependence: Secondary | ICD-10-CM | POA: Diagnosis not present

## 2021-07-08 DIAGNOSIS — Z85118 Personal history of other malignant neoplasm of bronchus and lung: Secondary | ICD-10-CM | POA: Diagnosis not present

## 2021-08-16 ENCOUNTER — Ambulatory Visit: Payer: Medicare Other | Admitting: Radiation Oncology

## 2021-08-20 ENCOUNTER — Encounter: Payer: Self-pay | Admitting: Internal Medicine

## 2021-08-20 ENCOUNTER — Inpatient Hospital Stay: Payer: Medicare Other | Attending: Internal Medicine

## 2021-08-20 ENCOUNTER — Inpatient Hospital Stay (HOSPITAL_BASED_OUTPATIENT_CLINIC_OR_DEPARTMENT_OTHER): Payer: Medicare Other | Admitting: Internal Medicine

## 2021-08-20 ENCOUNTER — Ambulatory Visit
Admission: RE | Admit: 2021-08-20 | Discharge: 2021-08-20 | Disposition: A | Discharge status: Home / Self Care | Payer: Medicare Other | Source: Ambulatory Visit | Attending: Radiation Oncology | Admitting: Radiation Oncology

## 2021-08-20 VITALS — BP 133/51 | HR 63 | Temp 97.8°F | Resp 20 | Wt 104.0 lb

## 2021-08-20 DIAGNOSIS — J449 Chronic obstructive pulmonary disease, unspecified: Secondary | ICD-10-CM | POA: Diagnosis not present

## 2021-08-20 DIAGNOSIS — Z923 Personal history of irradiation: Secondary | ICD-10-CM | POA: Diagnosis not present

## 2021-08-20 DIAGNOSIS — Z800 Family history of malignant neoplasm of digestive organs: Secondary | ICD-10-CM | POA: Insufficient documentation

## 2021-08-20 DIAGNOSIS — R42 Dizziness and giddiness: Secondary | ICD-10-CM | POA: Insufficient documentation

## 2021-08-20 DIAGNOSIS — Z9981 Dependence on supplemental oxygen: Secondary | ICD-10-CM | POA: Insufficient documentation

## 2021-08-20 DIAGNOSIS — F419 Anxiety disorder, unspecified: Secondary | ICD-10-CM | POA: Insufficient documentation

## 2021-08-20 DIAGNOSIS — C3412 Malignant neoplasm of upper lobe, left bronchus or lung: Secondary | ICD-10-CM

## 2021-08-20 DIAGNOSIS — Z809 Family history of malignant neoplasm, unspecified: Secondary | ICD-10-CM | POA: Insufficient documentation

## 2021-08-20 DIAGNOSIS — K863 Pseudocyst of pancreas: Secondary | ICD-10-CM | POA: Insufficient documentation

## 2021-08-20 DIAGNOSIS — Z9221 Personal history of antineoplastic chemotherapy: Secondary | ICD-10-CM | POA: Diagnosis not present

## 2021-08-20 DIAGNOSIS — D649 Anemia, unspecified: Secondary | ICD-10-CM | POA: Insufficient documentation

## 2021-08-20 DIAGNOSIS — Z85118 Personal history of other malignant neoplasm of bronchus and lung: Secondary | ICD-10-CM | POA: Insufficient documentation

## 2021-08-20 DIAGNOSIS — Z87891 Personal history of nicotine dependence: Secondary | ICD-10-CM | POA: Diagnosis not present

## 2021-08-20 LAB — COMPREHENSIVE METABOLIC PANEL
ALT: 20 U/L (ref 0–44)
AST: 20 U/L (ref 15–41)
Albumin: 3.1 g/dL — ABNORMAL LOW (ref 3.5–5.0)
Alkaline Phosphatase: 69 U/L (ref 38–126)
Anion gap: 9 (ref 5–15)
BUN: 16 mg/dL (ref 8–23)
CO2: 28 mmol/L (ref 22–32)
Calcium: 9 mg/dL (ref 8.9–10.3)
Chloride: 94 mmol/L — ABNORMAL LOW (ref 98–111)
Creatinine, Ser: 0.76 mg/dL (ref 0.44–1.00)
GFR, Estimated: >60 mL/min (ref >60)
Glucose, Bld: 97 mg/dL (ref 70–99)
Potassium: 4 mmol/L (ref 3.5–5.1)
Sodium: 131 mmol/L — ABNORMAL LOW (ref 135–145)
Total Bilirubin: 0.6 mg/dL (ref 0.3–1.2)
Total Protein: 6.1 g/dL — ABNORMAL LOW (ref 6.5–8.1)

## 2021-08-20 LAB — CBC WITH DIFFERENTIAL/PLATELET
Abs Immature Granulocytes: 0.02 10*3/uL (ref 0.00–0.07)
Basophils Absolute: 0.1 10*3/uL (ref 0.0–0.1)
Basophils Relative: 1 %
Eosinophils Absolute: 0.1 10*3/uL (ref 0.0–0.5)
Eosinophils Relative: 2 %
HCT: 30.2 % — ABNORMAL LOW (ref 36.0–46.0)
Hemoglobin: 10.3 g/dL — ABNORMAL LOW (ref 12.0–15.0)
Immature Granulocytes: 0 %
Lymphocytes Relative: 13 %
Lymphs Abs: 0.9 10*3/uL (ref 0.7–4.0)
MCH: 31.4 pg (ref 26.0–34.0)
MCHC: 34.1 g/dL (ref 30.0–36.0)
MCV: 92.1 fL (ref 80.0–100.0)
Monocytes Absolute: 0.5 10*3/uL (ref 0.1–1.0)
Monocytes Relative: 8 %
Neutro Abs: 4.9 10*3/uL (ref 1.7–7.7)
Neutrophils Relative %: 76 %
Platelets: 271 10*3/uL (ref 150–400)
RBC: 3.28 MIL/uL — ABNORMAL LOW (ref 3.87–5.11)
RDW: 13.3 % (ref 11.5–15.5)
WBC: 6.5 10*3/uL (ref 4.0–10.5)
nRBC: 0 % (ref 0.0–0.2)

## 2021-08-20 NOTE — Progress Notes (Signed)
Radiation Oncology Follow up Note  Name: Bethany Lambert   Date:   08/20/2021 MRN:  [id number] DOB: 01-Oct-1934    This 87 y.o. female presents to the clinic today for 1 month follow-up status post SBRT to a r to her right lower lobe for hypermetabolic non-small cell lung cancer.  She previously received SBRT to her right upper lobe now at over 5 years  REFERRING PROVIDER: [facility], [facility]  HPI: Patient is a 87 year old female now seen at 1 month having completed SBRT for progressive hypermetabolic lesion in the right lung now out 5 years status post SBRT to right upper lobe.  Seen today she is doing fair she is on nasal oxygen she continues to have problems with dizziness of unknown etiology her pulmonary status is not significantly changed.  She specifically [place] cough hemoptysis or chest tightness..  COMPLICATIONS OF TREATMENT: none  FOLLOW UP COMPLIANCE: keeps appointments   PHYSICAL EXAM:  There were no vitals taken for this visit. Frail-appearing wheelchair-bound female on nasal oxygen in NAD.  Well-developed well-nourished patient in NAD. HEENT reveals PERLA, EOMI, discs not visualized.  Oral cavity is clear. No oral mucosal lesions are identified. Neck is clear without evidence of cervical or supraclavicular adenopathy. Lungs are clear to A&P. Cardiac examination is essentially unremarkable with regular rate and rhythm without murmur rub or thrill. Abdomen is benign with no organomegaly or masses noted. Motor sensory and DTR levels are equal and symmetric in the upper and lower extremities. Cranial nerves II through XII are grossly intact. Proprioception is intact. No peripheral adenopathy or edema is identified. No motor or sensory levels are noted. Crude visual fields are within normal range.  RADIOLOGY RESULTS: CT scan has been ordered in November  PLAN: Present time patient is stable with no significant change or side effects from SBRT.  I will see her back in November after  her CT scan has been completed.  Patient continues close follow-up care with medical oncology.  Patient knows to call with any concerns.  I would like to take this opportunity to thank you for allowing me to participate in the care of your patient.Noreene Filbert, MD

## 2021-08-20 NOTE — Assessment & Plan Note (Addendum)
#   Right upper lobe lung nodule [11 x 8 mm] s/p SBRT in end of July 2017.  CT scan- MAY 2022- # a] medial RLL- Progressive 1.5 x 1.4cm [ Nov 2021-8 mm nodule; b] central RML15 mm ill-defined sub solid opacity- STABLE c] Anterior subpleural RUL 7 x 14 mm subpleural- STABLE.   #Right lower lobe lung nodule-s/p SBRT; plan CT follow-up in November 2022.  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 CT MAY 2022-stable no new changes. STABLE.   # Pancreatic cyst-February 2021 MRI abdomen shows multiple cysts in the pancreas-pseudocyst-not concerning for malignancy. STABLE; repeat in spring 2023.   #Chronic anemia-normocytic; hemoglobin 9-10; no evidence of obvious iron deficiency -STABLE.   # Dizziness- chronic [ENT/[facility] neurology]-recommend follow up  Neurology/ENT-STABLE.    #COPD chronicon [facility]- STABLE/continue follow-up with Dr.Khan.  # Anxiety/sleep- recommend Buspar/continue melatonin 10 mg nightly.  STABLE.   # DISPOSITION:  # follow in 1st week of November - MD; labs- cbc/cmp; CT chest prior-Dr.B  Cc; Hayley.

## 2021-08-23 NOTE — Progress Notes (Signed)
[facility] OFFICE PROGRESS NOTE  Patient Care Team: Care, [facility] Primary as PCP - General (Family Medicine)  Cancer Staging No matching staging information was found for the patient.   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  # COPD Home O2 2.5L -  # 2019-2020-mild anemia- Hb-10; no IDA; colo-"many years ago".   # DIAGNOSIS: Lung cancer- LUL stage III/ RUL stage I  GOALS: cuartive  CURRENT/MOST RECENT THERAPY; surveillaince    Cancer of upper lobe of left lung (HCC)    INTERVAL HISTORY: Thin built Caucasian female patient.  Accompanied by daughter.  She is in a wheelchair.  2.5 L of oxygen.  Bethany Lambert 87 y.o.  female pleasant patient above history of lung cancer/COPD pancreatic cysts is here for follow-up.  In the interim patient underwent SBRT of the right lower lobe lung nodule.  Tolerated treatment fairly well.   Patient admits to chronic shortness of breath chronic cough.  Chronic fatigue.  Denies any worse abdominal pain.  No nausea no vomiting.    Denies any headache.  Denies any falls.  Patient continues to be on chronic oxygen.  Review of Systems  Constitutional:  Positive for malaise/fatigue. Negative for chills, diaphoresis, fever and weight loss.  HENT:  Negative for nosebleeds and sore throat.   Eyes:  Negative for double vision.  Respiratory:  Positive for cough and shortness of breath. Negative for hemoptysis and sputum production.   Cardiovascular:  Negative for chest pain, palpitations, orthopnea and leg swelling.  Gastrointestinal:  Negative for abdominal pain,  blood in stool, constipation, diarrhea, heartburn, melena, nausea and vomiting.  Genitourinary:  Negative for dysuria, frequency and urgency.  Musculoskeletal:  Positive for back pain and joint pain.  Skin: Negative.  Negative for itching and rash.  Neurological:  Negative for dizziness, tingling, focal weakness, weakness and headaches.  Endo/Heme/Allergies:  Does not bruise/bleed easily.  Psychiatric/Behavioral:  Positive for memory loss. Negative for depression. The patient is not nervous/anxious and does not have insomnia.    PAST MEDICAL HISTORY :  Past Medical History:  Diagnosis Date  . 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 COPD (chronic obstructive pulmonary disease) (HCC)    on 2L home o2  . Dizziness   . Erythrocytosis   . GERD (gastroesophageal reflux disease)   . History of chemotherapy   . History of radiation therapy   . Hyperlipidemia   . Hypertension   . Kidney failure   . Polycythemia   . Psoriasis     PAST SURGICAL HISTORY :   Past Surgical History:  Procedure Laterality Date  . CATARACT EXTRACTION    . COLON RESECTION    . CT GUIDED BIOPSY  ([facility] HX)  11/19/2012   lung  . HERNIA REPAIR    . TOTAL ABDOMINAL HYSTERECTOMY      FAMILY HISTORY :   Family History  Problem Relation Age of Onset  . Heart attack Father   . Colon cancer Father   . Stroke Mother   .  Cancer Son     SOCIAL HISTORY:   Social History   Tobacco Use  . Smoking status: Former    Packs/day: 2.00    Years: 40.00    Pack years: 80.00    Types: Cigarettes    Quit date: 06/22/2001    Years since quitting: 20.1  . Smokeless tobacco: Never  Vaping Use  . Vaping Use: Never used  Substance Use Topics  . Alcohol use: No    Alcohol/week: 0.0 standard drinks  . Drug use: No    ALLERGIES:  is allergic to ciprofloxacin and norvasc [amlodipine besylate].  MEDICATIONS:  Current Outpatient Medications  Medication Sig Dispense Refill   . ALPRAZolam (XANAX) 0.25 MG tablet Take 0.25 mg by mouth at bedtime as needed for anxiety.    . ALPRAZolam (XANAX) 0.25 MG tablet TAKE 1 TABLET BY MOUTH 2 TIMES DAILY AS NEEDED FOR ANXIETY FOR UP TO 30 DAYS    . busPIRone (BUSPAR) 5 MG tablet Take by mouth.    . cyanocobalamin 1000 MCG tablet Take 1,000 mcg by mouth daily.    Marland Kitchen diltiazem (CARDIZEM CD) 120 MG 24 hr capsule   2  . docusate sodium (COLACE) 100 MG capsule Take 100-200 mg by mouth 2 (two) times daily. 100 mg in the morning and 200 mg at bedtime    . ipratropium-albuterol (DUONEB) 0.5-2.5 (3) MG/3ML SOLN Take 3 mLs by nebulization every 6 (six) hours as needed (wheezing).     Marland Kitchen losartan (COZAAR) 50 MG tablet Take by mouth.    . metoprolol (LOPRESSOR) 50 MG tablet Take 1 tablet (50 mg total) by mouth 2 (two) times daily.    Marland Kitchen omeprazole (PRILOSEC) 20 MG capsule Take by mouth.    . OXYGEN Inhale 3 L into the lungs continuous.     . potassium chloride (K-DUR) 10 MEQ tablet 10 mEq daily.   11  . PROAIR HFA 108 (90 Base) MCG/ACT inhaler INHALE 2 PUFFS INTO THE LUNGS EVERY 6 (SIX) HOURS AS NEEDED FOR WHEEZING. 18 g 2  . SPIRIVA HANDIHALER 18 MCG inhalation capsule Place 1 capsule (18 mcg total) into inhaler and inhale daily. 30 capsule 3  . SYMBICORT 80-4.5 MCG/ACT inhaler INHALE 2 PUFFS BY MOUTH INTO THE LUNGS 2TIMES DAILY 10.2 g 3  . traZODone (DESYREL) 100 MG tablet Take 100 mg by mouth at bedtime as needed for sleep.     No current facility-administered medications for this visit.    PHYSICAL EXAMINATION: ECOG PERFORMANCE STATUS: 1 - Symptomatic but completely ambulatory  BP (!) 133/51   Pulse 63   Temp 97.8 F (36.6 C)   Resp 20   Wt 104 lb (47.2 kg)   SpO2 100%   BMI 21.74 kg/m   Filed Weights   08/20/21 1003  Weight: 104 lb (47.2 kg)    Physical Exam HENT:     Head: Normocephalic and atraumatic.     Mouth/Throat:     Pharynx: No oropharyngeal exudate.  Eyes:     Pupils: Pupils are equal, round, and reactive  to light.  Cardiovascular:     Rate and Rhythm: Normal rate and regular rhythm.  Pulmonary:     Effort: No respiratory distress.     Breath sounds: No wheezing.  Abdominal:     General: Bowel sounds are normal. There is no distension.     Palpations: Abdomen is soft. There is no mass.     Tenderness: There is no abdominal tenderness. There is no guarding or  rebound.  Musculoskeletal:        General: No tenderness. Normal range of motion.     Cervical back: Normal range of motion and neck supple.  Skin:    General: Skin is warm.  Neurological:     Mental Status: She is alert and oriented to person, place, and time.  Psychiatric:        Mood and Affect: Affect normal.       LABORATORY DATA:  I have reviewed the data as listed    Component Value Date/Time   NA 131 (L) 08/20/2021 0924   NA 136 12/29/2014 0827   K 4.0 08/20/2021 0924   K 4.0 12/29/2014 0827   CL 94 (L) 08/20/2021 0924   CL 102 12/29/2014 0827   CO2 28 08/20/2021 0924   CO2 28 12/29/2014 0827   GLUCOSE 97 08/20/2021 0924   GLUCOSE 115 (H) 12/29/2014 0827   BUN 16 08/20/2021 0924   BUN 15 12/29/2014 0827   CREATININE 0.76 08/20/2021 0924   CREATININE 1.23 12/29/2014 0827   CALCIUM 9.0 08/20/2021 0924   CALCIUM 8.8 12/29/2014 0827   PROT 6.1 (L) 08/20/2021 0924   PROT 6.3 (L) 12/01/2014 1355   ALBUMIN 3.1 (L) 08/20/2021 0924   ALBUMIN 3.2 (L) 12/01/2014 1355   AST 20 08/20/2021 0924   AST 14 (L) 12/01/2014 1355   ALT 20 08/20/2021 0924   ALT 22 12/01/2014 1355   ALKPHOS 69 08/20/2021 0924   ALKPHOS 100 12/01/2014 1355   BILITOT 0.6 08/20/2021 0924   BILITOT 0.3 12/01/2014 1355   GFRNONAA >60 08/20/2021 0924   GFRNONAA 45 (L) 12/29/2014 0827   GFRNONAA 42 (L) 08/13/2014 1020   GFRAA >60 04/22/2020 1442   GFRAA 54 (L) 12/29/2014 0827   GFRAA 49 (L) 08/13/2014 1020    No results found for: SPEP, UPEP  Lab Results  Component Value Date   WBC 6.5 08/20/2021   NEUTROABS 4.9 08/20/2021   HGB  10.3 (L) 08/20/2021   HCT 30.2 (L) 08/20/2021   MCV 92.1 08/20/2021   PLT 271 08/20/2021      Chemistry      Component Value Date/Time   NA 131 (L) 08/20/2021 0924   NA 136 12/29/2014 0827   K 4.0 08/20/2021 0924   K 4.0 12/29/2014 0827   CL 94 (L) 08/20/2021 0924   CL 102 12/29/2014 0827   CO2 28 08/20/2021 0924   CO2 28 12/29/2014 0827   BUN 16 08/20/2021 0924   BUN 15 12/29/2014 0827   CREATININE 0.76 08/20/2021 0924   CREATININE 1.23 12/29/2014 0827      Component Value Date/Time   CALCIUM 9.0 08/20/2021 0924   CALCIUM 8.8 12/29/2014 0827   ALKPHOS 69 08/20/2021 0924   ALKPHOS 100 12/01/2014 1355   AST 20 08/20/2021 0924   AST 14 (L) 12/01/2014 1355   ALT 20 08/20/2021 0924   ALT 22 12/01/2014 1355   BILITOT 0.6 08/20/2021 0924   BILITOT 0.3 12/01/2014 1355       RADIOGRAPHIC STUDIES: I have personally reviewed the radiological images as listed and agreed with the findings in the report. No results found.   ASSESSMENT & PLAN:  Cancer of upper lobe of left lung ([place]) # Right upper lobe lung nodule [11 x 8 mm] s/p SBRT in end of July 2017.  CT scan- MAY 2022- # a] medial RLL- Progressive 1.5 x 1.4cm [ Nov 2021-8 mm nodule; b] central RML15 mm ill-defined sub solid opacity-  STABLE c] Anterior subpleural RUL 7 x 14 mm subpleural- STABLE.   #Right lower lobe lung nodule-s/p SBRT; plan CT follow-up in November 2022.  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 CT MAY 2022-stable no new changes. STABLE.   # Pancreatic cyst-February 2021 MRI abdomen shows multiple cysts in the pancreas-pseudocyst-not concerning for malignancy. STABLE; repeat in spring 2023.   #Chronic anemia-normocytic; hemoglobin 9-10; no evidence of obvious iron deficiency -STABLE.   # Dizziness- chronic [ENT/[facility] neurology]-recommend follow up  Neurology/ENT-STABLE.    #COPD chronicon [place] [facility]- STABLE/continue follow-up with  Dr.Khan.  # Anxiety/sleep- recommend Buspar/continue melatonin 10 mg nightly.  STABLE.   # DISPOSITION:  # follow in 1st week of November - MD; labs- cbc/cmp; CT chest prior-Dr.B  Cc; [place].        Orders Placed This Encounter  Procedures  . CT CHEST W CONTRAST    Standing Status:   Future    Standing Expiration Date:   08/20/2022    Order Specific Question:   If indicated for the ordered procedure, I authorize the administration of contrast media per Radiology protocol    Answer:   Yes    Order Specific Question:   Preferred imaging location?    Answer:   [facility]   All questions were answered. The patient knows to call the clinic with any problems, questions or concerns.      Cammie Sickle, MD 08/25/2021 4:16 PM

## 2021-09-14 ENCOUNTER — Other Ambulatory Visit: Payer: Self-pay | Admitting: Internal Medicine

## 2021-09-14 DIAGNOSIS — J449 Chronic obstructive pulmonary disease, unspecified: Secondary | ICD-10-CM

## 2021-09-28 ENCOUNTER — Telehealth: Payer: Self-pay | Admitting: Internal Medicine

## 2021-09-28 NOTE — Telephone Encounter (Signed)
It is all cancelled

## 2021-10-07 ENCOUNTER — Ambulatory Visit: Payer: Medicare Other

## 2021-10-07 ENCOUNTER — Other Ambulatory Visit: Payer: Medicare Other

## 2021-10-12 ENCOUNTER — Ambulatory Visit: Payer: Medicare Other | Admitting: Internal Medicine

## 2021-10-12 ENCOUNTER — Ambulatory Visit: Payer: Medicare Other | Admitting: Radiation Oncology

## 2022-02-02 DEATH — deceased
